# Patient Record
Sex: Male | Born: 1935 | Race: Black or African American | Hispanic: No | Marital: Married | Smoking: Former smoker
Health system: Southern US, Community
[De-identification: ages and names within clinical notes are randomized; demographics above are authoritative.]

## PROBLEM LIST (undated history)

## (undated) DIAGNOSIS — I493 Ventricular premature depolarization: Secondary | ICD-10-CM

## (undated) DIAGNOSIS — Z8546 Personal history of malignant neoplasm of prostate: Secondary | ICD-10-CM

## (undated) DIAGNOSIS — I272 Pulmonary hypertension, unspecified: Secondary | ICD-10-CM

## (undated) DIAGNOSIS — J449 Chronic obstructive pulmonary disease, unspecified: Secondary | ICD-10-CM

## (undated) DIAGNOSIS — R0902 Hypoxemia: Secondary | ICD-10-CM

## (undated) HISTORY — DX: Hypoxemia: R09.02

## (undated) HISTORY — PX: PROSTATECTOMY: SHX69

## (undated) HISTORY — PX: CYSTECTOMY: SUR359

## (undated) HISTORY — DX: Ventricular premature depolarization: I49.3

## (undated) HISTORY — DX: Personal history of malignant neoplasm of prostate: Z85.46

## (undated) HISTORY — DX: Chronic obstructive pulmonary disease, unspecified: J44.9

## (undated) HISTORY — DX: Pulmonary hypertension, unspecified: I27.20

---

## 2003-05-08 ENCOUNTER — Inpatient Hospital Stay (HOSPITAL_COMMUNITY): Admission: RE | Admit: 2003-05-08 | Discharge: 2003-05-09 | Payer: Self-pay | Admitting: General Surgery

## 2006-10-11 ENCOUNTER — Ambulatory Visit: Payer: Self-pay | Admitting: Cardiology

## 2006-10-14 ENCOUNTER — Inpatient Hospital Stay (HOSPITAL_COMMUNITY): Admission: AD | Admit: 2006-10-14 | Discharge: 2006-10-24 | Payer: Self-pay | Admitting: Critical Care Medicine

## 2006-10-14 ENCOUNTER — Ambulatory Visit: Payer: Self-pay | Admitting: Cardiology

## 2006-10-14 ENCOUNTER — Ambulatory Visit: Payer: Self-pay | Admitting: Critical Care Medicine

## 2006-10-16 ENCOUNTER — Encounter (INDEPENDENT_AMBULATORY_CARE_PROVIDER_SITE_OTHER): Payer: Self-pay | Admitting: Internal Medicine

## 2006-10-16 ENCOUNTER — Ambulatory Visit: Payer: Self-pay | Admitting: Vascular Surgery

## 2006-10-21 ENCOUNTER — Encounter: Payer: Self-pay | Admitting: Critical Care Medicine

## 2006-11-21 ENCOUNTER — Ambulatory Visit: Payer: Self-pay | Admitting: Pulmonary Disease

## 2007-01-11 ENCOUNTER — Ambulatory Visit: Payer: Self-pay | Admitting: Pulmonary Disease

## 2007-03-18 ENCOUNTER — Encounter: Payer: Self-pay | Admitting: Cardiology

## 2007-03-20 DIAGNOSIS — J438 Other emphysema: Secondary | ICD-10-CM | POA: Insufficient documentation

## 2007-03-20 DIAGNOSIS — J449 Chronic obstructive pulmonary disease, unspecified: Secondary | ICD-10-CM | POA: Insufficient documentation

## 2007-03-22 ENCOUNTER — Telehealth: Payer: Self-pay | Admitting: Pulmonary Disease

## 2007-04-17 ENCOUNTER — Encounter: Payer: Self-pay | Admitting: Cardiology

## 2007-04-29 ENCOUNTER — Ambulatory Visit: Payer: Self-pay | Admitting: Pulmonary Disease

## 2007-04-29 DIAGNOSIS — J9610 Chronic respiratory failure, unspecified whether with hypoxia or hypercapnia: Secondary | ICD-10-CM | POA: Insufficient documentation

## 2007-05-14 ENCOUNTER — Encounter: Payer: Self-pay | Admitting: Pulmonary Disease

## 2007-06-06 ENCOUNTER — Encounter: Payer: Self-pay | Admitting: Pulmonary Disease

## 2007-07-03 ENCOUNTER — Encounter: Payer: Self-pay | Admitting: Pulmonary Disease

## 2007-07-13 ENCOUNTER — Encounter: Payer: Self-pay | Admitting: Cardiology

## 2007-07-15 ENCOUNTER — Encounter: Payer: Self-pay | Admitting: Cardiology

## 2007-08-16 ENCOUNTER — Ambulatory Visit: Payer: Self-pay | Admitting: Cardiology

## 2007-08-21 ENCOUNTER — Ambulatory Visit: Payer: Self-pay | Admitting: Cardiology

## 2007-09-05 ENCOUNTER — Encounter: Payer: Self-pay | Admitting: Pulmonary Disease

## 2007-09-05 ENCOUNTER — Ambulatory Visit: Payer: Self-pay | Admitting: Cardiology

## 2007-10-09 ENCOUNTER — Encounter: Payer: Self-pay | Admitting: Pulmonary Disease

## 2007-10-10 ENCOUNTER — Telehealth (INDEPENDENT_AMBULATORY_CARE_PROVIDER_SITE_OTHER): Payer: Self-pay | Admitting: *Deleted

## 2007-10-22 ENCOUNTER — Ambulatory Visit: Payer: Self-pay | Admitting: Pulmonary Disease

## 2007-10-22 DIAGNOSIS — I270 Primary pulmonary hypertension: Secondary | ICD-10-CM | POA: Insufficient documentation

## 2007-10-25 ENCOUNTER — Telehealth (INDEPENDENT_AMBULATORY_CARE_PROVIDER_SITE_OTHER): Payer: Self-pay | Admitting: *Deleted

## 2008-03-13 ENCOUNTER — Encounter: Payer: Self-pay | Admitting: Cardiology

## 2008-07-20 ENCOUNTER — Ambulatory Visit: Payer: Self-pay | Admitting: Cardiology

## 2008-12-19 DIAGNOSIS — I4949 Other premature depolarization: Secondary | ICD-10-CM | POA: Insufficient documentation

## 2009-01-21 ENCOUNTER — Encounter: Payer: Self-pay | Admitting: Cardiology

## 2009-01-21 DIAGNOSIS — R0602 Shortness of breath: Secondary | ICD-10-CM | POA: Insufficient documentation

## 2009-02-01 ENCOUNTER — Encounter: Payer: Self-pay | Admitting: Cardiology

## 2009-02-01 ENCOUNTER — Ambulatory Visit: Payer: Self-pay | Admitting: Cardiology

## 2009-02-09 ENCOUNTER — Encounter (INDEPENDENT_AMBULATORY_CARE_PROVIDER_SITE_OTHER): Payer: Self-pay | Admitting: *Deleted

## 2009-03-29 ENCOUNTER — Ambulatory Visit: Payer: Self-pay | Admitting: Cardiology

## 2010-04-03 ENCOUNTER — Encounter: Payer: Self-pay | Admitting: General Surgery

## 2010-04-12 ENCOUNTER — Encounter: Payer: Self-pay | Admitting: Cardiology

## 2010-04-12 ENCOUNTER — Ambulatory Visit
Admission: RE | Admit: 2010-04-12 | Discharge: 2010-04-12 | Payer: Self-pay | Source: Home / Self Care | Attending: Cardiology | Admitting: Cardiology

## 2010-04-12 NOTE — Miscellaneous (Signed)
  Clinical Lists Changes  Observations: Added new observation of ECHOINTERP: mild LVH. Normal LV systolic function. Ejection fraction 65%. Abnormal diastolic function consistent with impaired relaxation. Mildly dilated left atrium. Mild mitral regurgitation. Mildly dilated right ventricle but right ventricular function is normal. Right atrium mildly dilated. Mild tricuspid regurgitation. Estimated RV systolic pressure is 43 mm of mercury. Mild pulmonary hypertension and no pericardial effusion (02/01/2009 9:42)      Echocardiogram  Procedure date:  02/01/2009  Findings:      mild LVH. Normal LV systolic function. Ejection fraction 65%. Abnormal diastolic function consistent with impaired relaxation. Mildly dilated left atrium. Mild mitral regurgitation. Mildly dilated right ventricle but right ventricular function is normal. Right atrium mildly dilated. Mild tricuspid regurgitation. Estimated RV systolic pressure is 43 mm of mercury. Mild pulmonary hypertension and no pericardial effusion

## 2010-04-12 NOTE — Assessment & Plan Note (Signed)
Summary: 6 mo fu reminder-srs   Visit Type:  Follow-up Primary Provider:  Linna Darner  CC:  follow-up visit.  History of Present Illness: the patient is a 77 year old male with a history of severe underlying lung disease, oxygen dependent follow up by pulmonary. The patient is stable from a cardiac standpoint. He reports no chest pain. He reports no palpitations or syncope. The patient has a prior documented history of palpitations correlating to PVCs and sinus tachycardia confirmed by CardioNet monitor. It has been no definite documentation of atrial fibrillation. The patient is stable from a cardiac standpoint.  Clinical Review Panels:  CXR CXR results Portable upright AP view at 0139 hours.  Mild respiratory         motion artifact.  Increased retrocardiac confluent airspace         opacities suggestive of pneumonia.  No pneumothorax, pulmonary         edema or definite pleural effusion.  Cardiac size and mediastinal         contours are within normal limits.  Minor medial right lung base         opacity also seen, but may represent atelectasis.                   IMPRESSION:         Left lower lobe pneumonia suspected. (03/13/2008)  Echocardiogram Echocardiogram Conclusions:         1. The estimated ejection fraction is 55-60%.          2. Global left ventricular wall motion and contractility are within         normal limits.         3. The left atrium is mildly dilated.         4. There is moderate mitral regurgitation.          5. The pericardium appears normal.         6. The inferior vena cava appears normal. (08/21/2007)    Preventive Screening-Counseling & Management  Alcohol-Tobacco     Smoking Status: quit > 6 months  Comments: Pt smoked for about 20 yrs but quit about 26 yrs ago.  Current Medications (verified): 1)  Spiriva Handihaler 18 Mcg  Caps (Tiotropium Bromide Monohydrate) .Marland Kitchen.. 1 Cap Every Day 2)  Nexium 40 Mg  Cpdr (Esomeprazole Magnesium) .Marland Kitchen.. 1 Every  Day 3)  Atenolol 50 Mg  Tabs (Atenolol) .Marland Kitchen.. 1 Tab Every Day 4)  Micardis Hct 80-12.5 Mg Tabs (Telmisartan-Hctz) .... Take 1 Tablet By Mouth Once A Day 5)  Advair Diskus 250-50 Mcg/dose  Misc (Fluticasone-Salmeterol) .Marland Kitchen.. 1 Puff Two Times A Day 6)  Adult Aspirin Ec Low Strength 81 Mg  Tbec (Aspirin) .... Take One Tablet By Mouth Daily. 7)  Proair Hfa 108 (90 Base) Mcg/act  Aers (Albuterol Sulfate) .... Take 2 Puffs Up To 4 Times Daily As Needed. 8)  Vitamin D3 5000 Unit/ml Liqd (Cholecalciferol) .... Take 1 Tablet By Mouth Once A Day  Allergies: 1)  ! Neosporin  Comments:  Nurse/Medical Assistant: The patient's medications were reviewed with the patient and were updated in the Medication List. Pt brought a list of his medications.  Past History:  Past Medical History: Last updated: 12/19/2008 PREMATURE VENTRICULAR CONTRACTIONS (ICD-427.69) PULMONARY HYPERTENSION (ICD-416.0) HYPOXEMIA HYPOXIA HYPOXIC (ICD-799) EMPHYSEMA (ICD-492.8) C O P D (ICD-496)  Past Surgical History: Last updated: 12/19/2008 cyst removed x 3 Prostatectomy  Family History: Last updated: 03/29/2009 noncontributory  Social History: Last updated: 12/19/2008 Retired  Married  Tobacco Use - Former.  Alcohol Use - no Regular Exercise - no Drug Use - no  Risk Factors: Smoking Status: quit > 6 months (03/29/2009)  Family History: noncontributory  Social History: Smoking Status:  quit > 6 months  Review of Systems       The patient complains of shortness of breath.  The patient denies fatigue, malaise, fever, weight gain/loss, vision loss, decreased hearing, hoarseness, chest pain, palpitations, prolonged cough, wheezing, sleep apnea, coughing up blood, abdominal pain, blood in stool, nausea, vomiting, diarrhea, heartburn, incontinence, blood in urine, muscle weakness, joint pain, leg swelling, rash, skin lesions, headache, fainting, dizziness, depression, anxiety, enlarged lymph nodes, easy bruising  or bleeding, and environmental allergies.    Vital Signs:  Patient profile:   77 year old male Height:      73 inches Weight:      236.50 pounds BMI:     31.32 O2 Sat:      94 % on Room air Pulse rate:   67 / minute BP sitting:   118 / 70  (left arm) Cuff size:   regular  Vitals Entered By: Cyril Loosen, RN, BSN (March 29, 2009 9:42 AM)  Nutrition Counseling: Patient's BMI is greater than 25 and therefore counseled on weight management options.  O2 Flow:  Room air CC: follow-up visit Comments No cardiac complaints.   Physical Exam  Additional Exam:  General: Well-developed, well-nourished in no distress head: Normocephalic and atraumatic eyes PERRLA/EOMI intact, conjunctiva and lids normal nose: No deformity or lesions mouth normal dentition, normal posterior pharynx neck: Supple, no JVD.  No masses, thyromegaly or abnormal cervical nodes lungs:decreased breath sounds bilaterally but no wheezing heart: regular rate and rhythm with normal S1 and S2, no S3 or S4.  PMI is normal.  No pathological murmurs abdomen: Normal bowel sounds, abdomen is soft and nontender without masses, organomegaly or hernias noted.  No hepatosplenomegaly musculoskeletal: Back normal, normal gait muscle strength and tone normal pulsus: Pulse is normal in all 4 extremities Extremities: No peripheral pitting edema neurologic: Alert and oriented x 3 skin: Intact without lesions or rashes cervical nodes: No significant adenopathy psychologic: Normal affect     EKG  Procedure date:  03/29/2009  Findings:      normal sinus rhythm right bundle branch block. Normal ST-T wave changes.  Impression & Recommendations:  Problem # 1:  EMPHYSEMA (ICD-492.8) the patient is followed by pulmonary. He remains on oxygen.  Problem # 2:  PULMONARY HYPERTENSION (ICD-416.0) decision making regarding medical therapies refer to pulmonary. Orders: EKG w/ Interpretation (93000)  Problem # 3:  PREMATURE  VENTRICULAR CONTRACTIONS (ICD-427.69) EKG was reviewed and demonstrates no PVCs. The patient also has no significant symptoms. He does have right bundle branch block EKG which is chronic. His updated medication list for this problem includes:    Atenolol 50 Mg Tabs (Atenolol) .Marland Kitchen... 1 tab every day    Adult Aspirin Ec Low Strength 81 Mg Tbec (Aspirin) .Marland Kitchen... Take one tablet by mouth daily.  Orders: EKG w/ Interpretation (93000)  Patient Instructions: 1)  Your physician recommends that you continue on your current medications as directed. Please refer to the Current Medication list given to you today. 2)  Follow up in 1 year.

## 2010-04-12 NOTE — Procedures (Signed)
Summary: Holter and Event/ [facility] INTERNAL MEDICINE  Holter and Event/ [facility]IC[facility]   Imported By: Dorise Hiss 03/29/2009 08:29:50  _____________________________________________________________________  External Attachment:    Type:   Image     Comment:   External Document

## 2010-04-20 NOTE — Assessment & Plan Note (Signed)
Summary: 1 yr fu reminder-srs   Visit Type:  Follow-up Primary Provider:  Hasanaj   History of Present Illness: the patient is a 77 year old male with a history of oxygen-dependent lung disease.  The patient is followed by pulmonary in [place].  He has not been seen in over a year.  He is requesting a follow-up appointment.  The patient denies any chest pain orthopnea PND.  He had an echocardiogram performed in 2010 which was within normal limits.  He has no history of coronary artery disease.  He denies any palpitations or syncope.  He has prior history of PVCs which appear to have resolved.  From a cardiovascular standpoint is otherwise stable.  Preventive Screening-Counseling & Management  Alcohol-Tobacco     Smoking Status: quit     Year Quit: 1985  Current Medications (verified): 1)  Spiriva Handihaler 18 Mcg  Caps (Tiotropium Bromide Monohydrate) .Marland Kitchen.. 1 Cap Every Day 2)  Nexium 40 Mg  Cpdr (Esomeprazole Magnesium) .Marland Kitchen.. 1 Every Day 3)  Atenolol 50 Mg  Tabs (Atenolol) .Marland Kitchen.. 1 Tab Every Day 4)  Micardis Hct 80-12.5 Mg Tabs (Telmisartan-Hctz) .... Take 1 Tablet By Mouth Once A Day 5)  Advair Diskus 250-50 Mcg/dose  Misc (Fluticasone-Salmeterol) .Marland Kitchen.. 1 Puff Two Times A Day 6)  Adult Aspirin Ec Low Strength 81 Mg  Tbec (Aspirin) .... Take One Tablet By Mouth Daily. 7)  Proair Hfa 108 (90 Base) Mcg/act  Aers (Albuterol Sulfate) .... Take 2 Puffs Up To 4 Times Daily As Needed. 8)  Vitamin D3 5000 Unit/ml Liqd (Cholecalciferol) .... Take 1 Tablet By Mouth Once A Day  Allergies: 1)  ! Neosporin  Comments:  Nurse/Medical Assistant: The patient's medication list and allergies were reviewed with the patient and were updated in the Medication and Allergy Lists.  Past History:  Past Medical History: Last updated: 12/19/2008 PREMATURE VENTRICULAR CONTRACTIONS (ICD-427.69) PULMONARY HYPERTENSION (ICD-416.0) HYPOXEMIA HYPOXIA HYPOXIC (ICD-799) EMPHYSEMA (ICD-492.8) C O P D  (ICD-496)  Past Surgical History: Last updated: 12/19/2008 cyst removed x 3 Prostatectomy  Family History: Last updated: 04/12/2010 Negative FH of Diabetes, Hypertension, or Coronary Artery Disease  Social History: Last updated: 12/19/2008 Retired  Married  Tobacco Use - Former.  Alcohol Use - no Regular Exercise - no Drug Use - no  Risk Factors: Exercise: no (12/19/2008)  Risk Factors: Smoking Status: quit (04/12/2010)  Family History: Negative FH of Diabetes, Hypertension, or Coronary Artery Disease  Social History: Smoking Status:  quit  Review of Systems       The patient complains of weight gain/loss and shortness of breath.  The patient denies fatigue, malaise, fever, vision loss, decreased hearing, hoarseness, chest pain, palpitations, prolonged cough, wheezing, sleep apnea, coughing up blood, abdominal pain, blood in stool, nausea, vomiting, diarrhea, heartburn, incontinence, blood in urine, muscle weakness, joint pain, leg swelling, rash, skin lesions, headache, fainting, dizziness, depression, anxiety, enlarged lymph nodes, easy bruising or bleeding, and environmental allergies.    Vital Signs:  Patient profile:   77 year old male Height:      73 inches Weight:      242 pounds BMI:     32.04 O2 Sat:      90 % on 2 L/min via [place] Pulse rate:   83 / minute BP sitting:   121 / 70  (left arm) Cuff size:   large  Vitals Entered By: Carlye Grippe (April 12, 2010 11:24 AM)  Nutrition Counseling: Patient's BMI is greater than 25 and therefore counseled on weight  management options.  O2 Flow:  2 L/min via   Physical Exam  Additional Exam:  General: Well-developed, well-nourished in no distress head: Normocephalic and atraumatic eyes PERRLA/EOMI intact, conjunctiva and lids normal nose: No deformity or lesions mouth normal dentition, normal posterior pharynx neck: Supple, no JVD.  No masses, thyromegaly or abnormal cervical nodes lungs:decreased breath  sounds bilaterally but no wheezing heart: regular rate and rhythm with normal S1 and S2, no S3 or S4.  PMI is normal.  No pathological murmurs abdomen: Normal bowel sounds, abdomen is soft and nontender without masses, organomegaly or hernias noted.  No hepatosplenomegaly musculoskeletal: Back normal, normal gait muscle strength and tone normal pulsus: Pulse is normal in all 4 extremities Extremities: No peripheral pitting edema neurologic: Alert and oriented x 3 skin: Intact without lesions or rashes cervical nodes: No significant adenopathy psychologic: Normal affect     EKG  Procedure date:  04/12/2010  Findings:      normal sinus rhythm, right bundle branch block.  Heart rate 60 beats/min.  Impression & Recommendations:  Problem # 1:  PREMATURE VENTRICULAR CONTRACTIONS (ICD-427.69) resolved.  EKG was reviewed the patient is a chronic right bundle branch block pattern no acute changes. His updated medication list for this problem includes:    Atenolol 50 Mg Tabs (Atenolol) .Marland Kitchen... 1 tab every day    Adult Aspirin Ec Low Strength 81 Mg Tbec (Aspirin) .Marland Kitchen... Take one tablet by mouth daily.  Problem # 2:  PULMONARY HYPERTENSION (ICD-416.0) the patient has significant pulmonary hypertension secondary to his lung disease.  This is followed by his pulmonologist in [place] and we have scheduled a follow-up appointment. Orders: EKG w/ Interpretation (93000)  Problem # 3:  EMPHYSEMA (ICD-492.8) Assessment: Comment Only  Patient Instructions: 1)  Your physician recommends that you continue on your current medications as directed. Please refer to the Current Medication list given to you today. 2)  Follow up in  1 year

## 2010-07-26 NOTE — Assessment & Plan Note (Signed)
[facility]                          Danny Martinez   Danny Martinez, Danny Martinez                      MRN:          [id number]  DATE:07/20/2008                            DOB:          24-May-1933    REFERRING PHYSICIAN:  Erasmo Downer, MD   The patient is a 77 year old male with a history of severe underlying  lung disease, oxygen-dependent secondary to bullous emphysema, followed  by Dr. Vassie Loll in [place].  From cardiac standpoint, the patient is  stable.  He reports no chest pain.  He also reports no palpitations or  syncope.  Previously, we have documented PVCs and sinus tachycardia by  CardioNet monitor.  There has been no definite documentation of atrial  fibrillation, however.  The patient also had a discussion regarding  Coumadin use with Dr. Vassie Loll and a decision was made at this point not to  use Coumadin for his pulmonary hypertension, which on his last  echocardiogram quite frankly had improved to 62 mmHg.   The patient uses appropriately his oxygen on exertion and at night.  He  denies any orthopnea or PND.   MEDICATIONS:  1. Atenolol 50 mg p.o. daily.  2. Nexium 40 mg p.o. daily.  3. Spiriva inhaler.  4. Advair 250/50 one puff b.i.d.  5. Aspirin 81 mg p.o. daily.  6. Oxygen 1-2 L.  7. Micardis hydrochlorothiazide 80/12.5 mg p.o. daily.  8. Vitamin D3 5000 units daily.   PHYSICAL EXAMINATION:  VITAL SIGNS:  Blood pressure 178/78, heart rate  66, respirations 20, and weight 240 pounds.  GENERAL:  Well-nourished  African American male in no apparent distress.  HEENT:  Pupils, eyes are equal.  Conjunctivae clear.  NECK:  Supple.  No carotid upstroke.  No carotid bruits.  No adenopathy  in cervical or supraclavicular areas.  No thyromegaly.  Non-nodular  thyroid.  No carotid bruits.  LUNGS:  Diminished breath sounds bilaterally with no wheezing.  HEART:  Regular rate and rhythm.  Normal S1 and S2 with an occasional  extrasystole.  ABDOMEN:  Soft, nontender.  No rebound or guarding.  Good bowel sounds.  EXTREMITIES:  No cyanosis, clubbing, or edema.  NEUROLOGIC:  The patient is alert and oriented.  Grossly nonfocal.   PROBLEM LIST:  1. Premature atrial contraction/premature ventricular contractions and      sinus arrhythmia and no definitive atrial fibrillation.  2. 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 Vassie Loll.  3. Breast, thoracic, and prostate cancer.  4. Pulmonary hypertension.   PLAN:  1. The patient will have a repeat echocardiogram done in 6 months,      particularly to follow up on his mitral regurgitations.  Last      echocardiogram showed moderate mitral regurgitation.  On physical      examination, I cannot hear an MR murmur at the present time.  2. Clinically, also the patient has no heart failure symptoms and      therefore we will defer from repeating the echo at the present      time.  3.  From cardiac perspective, the patient will need to undergo further      workup and he can follow up with Dr. Vassie Loll regarding his lung      disease.  4. The patient has developed hypertension on Micardis, but he seems to      be tolerating.     Learta Codding, MD,FACC  Electronically Signed    GED/MedQ  DD: 07/20/2008  DT: 07/21/2008  Job #: [id number]   cc:   Erasmo Downer, MD

## 2010-07-26 NOTE — Assessment & Plan Note (Signed)
[facility]                          [facility] NOTE   Danny Martinez, Danny Martinez                      MRN:          [id number]  DATE:09/05/2007                            DOB:          1933/06/06    REFERRING PHYSICIAN:  Erasmo Downer, MD   HISTORY OF PRESENT ILLNESS:  The patient is a 77 year old male with  severe underlying lung disease, oxygen-dependent secondary to bullous  emphysema followed by Dr. Vassie Loll in [place].  The patient was seen in  consultation on August 16, 2007.  The possibility of atrial fibrillation  that was noted on Holter monitor.  We reviewed this data carefully and  its report in the note from August 16, 2007.  There was sinus arrhythmia  and frequent PACs, but no definitive or sustained atrial fibrillation.  I felt the patient based on this finding did not meet the criteria for a  Coumadin.  However, on echocardiographic study obtained, did demonstrate  ongoing severe pulmonary hypertension with PA pressure of 62 mmHg, but  normal RV function.  Based on this criteria, the patient could  potentially benefit from Coumadin, but we will leave this decision to  Dr. Vassie Loll.  The patient also reports no palpitations.  Echo also  demonstrates mitral regurgitation, but this certainly not explaining the  patient's pulmonary hypertension, which is suspected to be due to his  bullous emphysema.   MEDICATIONS:  1. Atenolol 50 mg p.o. daily.  2. Micardis 40 mg p.o. daily.  3. Nexium  40 mg p.o. daily.  4. Spiriva.  5. Advair.  6. Aspirin.  7. Oxygen.   PHYSICAL EXAMINATION:  VITAL SIGNS:  Blood pressure 160/83, heart rate  62, and weight 238 pounds.  NECK:  Normal carotid upstroke and no carotid bruits.  LUNGS:  Diminished breath sounds bilaterally.  HEART:  Regular rate and rhythm with normal S1 and S2 and no murmur,  rubs, or gallops.  ABDOMEN:  Soft.  EXTREMITIES:  No cyanosis, clubbing, or edema.   PROBLEMS:  1.  Premature atrial contractions.  Sinus arrhythmia (no definite      evidence of sustained atrial fibrillation).  2. Severe underlying lung disease with nonobstructive pulmonary      disease and emphysema.  3. History of breast mass.  4. Prostate cancer.  5. Pulmonary hypertension (improved by echo from 85 to 62 mmHg).   PLAN:  1. The patient has no clear indication to start the Coumadin for the      possibility of atrial fibrillation; however, does have pulmonary      hypertension and might still benefit from Coumadin in regards to      this release by Dr. Vassie Loll.  2. Although, the patient has severe pulmonary hypertension based on      his severe bullous lung disease, a vasodilator therapy with      bosentan and Remodulin may be inappropriate secondary to      ventilation-perfusion mismatch with his significant oxygen      dependence.  Again, I have asked the patient to further discuss      this with  Dr. Vassie Loll.  3. From a cardiac standpoint, however, I do not think the patient      needs any further workup at the present time.     Learta Codding, MD,FACC  Electronically Signed    GED/MedQ  DD: 09/05/2007  DT: 09/06/2007  Job #: [id number]   cc:   Erasmo Downer, MD  Oretha Milch, MD

## 2010-07-26 NOTE — Discharge Summary (Signed)
NAME:  Danny Martinez, Danny Martinez               ACCOUNT NO.:  [id number]   MEDICAL RECORD NO.:  [id number]          PATIENT TYPE:  INP   LOCATION:  3712                         FACILITY:  [facility]   PHYSICIAN:  Oretha Milch, MD      DATE OF BIRTH:  [birth year]   DATE OF ADMISSION:  10/14/2006  DATE OF DISCHARGE:  10/24/2006                               DISCHARGE SUMMARY   PRIORITY DISCHARGE SUMMARY   FINAL DISCHARGE DIAGNOSES:  1. Status post cardiopulmonary arrest.  2. Community-acquired pneumonia (no organism specified) with      exacerbation of chronic obstructive pulmonary disease.  3. Acute renal failure (resolving).   LABORATORY DATA:  October 19, 2006:  White blood cell count 7.2,  hemoglobin 9.7, hematocrit 29.7, platelet count 353.  October 14, 2006:  Blood cultures x2 negative.  October 20, 2006:  Sodium 143, potassium  4.6, chloride 109, CO2 28, glucose 80, BUN 21, creatinine 1.58.   RADIOLOGY:  Nuclear medicine myocardial perfusion scan findings:  No  fixed or reversible defects are seen to suggest infarction or ischemia.  There was normal wall motion and myocardial thickening with contraction.  The estimated ejection fraction was 78%.   A chest x-ray obtained on October 21, 2006, demonstrates improved  pulmonary infiltrates.   BRIEF HISTORY:  Danny Martinez is a 77 year old gentleman, who transferred  to [facility] from [facility] status post  cardiopulmonary arrest in the setting of hypoxic respiratory failure and  pneumonia.  He has a history of hypertension, and originally presented  to his primary care Danny Martinez on October 11, 2006, with progressive  shortness of breath.  At that time, he was noted to be severely hypoxic  and therefore was admitted to the hospital.  A CT scan was obtained that  demonstrated a right lower lobe pulmonary infiltrate consistent with a  pneumonia.  There was no evidence of pulmonary emboli.  There was  significant bullous disease of  the lung consistent with chronic  obstructive pulmonary disease changes.  The patient was in the hospital,  was apparently ambulating to the restroom on October 13, 2006, developed  a decreased level of consciousness, and progressive cardiopulmonary  arrest.  Medical records indicate probable pulseless electrical activity  on telemetry, he was originally found to be bradycardic without palpable  pulses, he was given epinephrine, and required endotracheal intubation,  additionally he was supported on Levophed and Neo-Synephrine drip.  He  was transferred to [facility] for further evaluation and  therapy.   HOSPITAL COURSE BY DISCHARGE DIAGNOSES:  1. Status post cardiopulmonary arrest.  After evaluation of clinical      events, Danny Martinez, as previously, mentioned developed acute      cardiopulmonary arrest at [facility], most likely in the      setting of hypoxic respiratory failure, precipitating brady-      asystolic/PEA arrhythmia.  He was primarily treated with      endotracheal intubation, brief course of initially Epinephrine,      then Levophed and Neo-Synephrine drips.  These were eventually  weaned to off.  He was liberated from vasoactive support,      eventually extubated on October 17, 2006, without further sequelae.      He did have a central line placed on hospital admit day, this was      removed on day of discharge.  Danny Martinez underwent further cardiac      evaluation including nuclear medicine myocardial perfusion scan      with the results mentioned above.  There was no active evidence of      cardiopulmonary disease.  It is the primary opinion of the Critical      Care Service that this was a brady-asystolic event in the setting      of severe hypoxia.  2. Community-acquired pneumonia (no organism specified), complicated      by underlying severe bullous emphysema.  Danny Martinez, as previously      mentioned, was transferred to [facility] on  full      ventilator support.  Therapy consisted of initially Levaquin and      doxycycline from July 31 to August 03, then upon arrival to [facility] he was treated with Rocephin and Azithromycin from      August 03 to August 10.  He completed his full course of      antibiotics.  His respiratory status continued to improve over the      course of his hospitalization including improvement in chest x-ray.      Upon time of discharge, Danny Martinez continues to have decreased      oxygen saturations with activity.  He was noted to have resting      room air saturations at 94%, however, these dropped quickly to 84%      with ambulation.  He will therefore be sent home on supplemental      oxygen at 2 liters until further evaluation by Dr. Vassie Loll.      Additionally in regards to his emphysema, Danny Martinez will be      discharged to home on daily Spiriva, additionally he will be      discharged to home on as needed Xopenex.  Upon time of discharge,      Danny Martinez has reached maximum hospital benefit of inpatient stay.      He will be discharged to home with close Pulmonary and Internal      Medicine followup.  3. Acute renal failure.  Danny Martinez presenting creatinine was 3.49.      Upon the time of discharge, it is now down to 1.58, most likely      secondary to a low perfusion state during cardiopulmonary arrest.      His Micardis has been held during this time, a Cardiology      consultation was obtained with [facility].  Upon time of      discharge, his blood pressure is 139/80.  Recommendations would      certainly be to follow up serum creatinine, with plan to eventually      resume Micardis when renal function improves.   DISCHARGE INSTRUCTIONS:  1. Oxygen 2 liters at all times.  2. Increase activity as tolerated.  3. Low sodium diet.  4. Follow up Dr. Vassie Loll with [facility] on November 21, 2006;      also Dr. Linna Darner on Friday, the 22nd, at 10:30 a.m.    DISCHARGE MEDICATIONS:  1. Spiriva one cap inhaled daily.  2.  Nexium 40 mg tab daily.  3. Atenolol 50 mg daily.  4. Aspirin 325 mg daily.  5. Xopenex neb, one neb every 6 hours as needed, or Xopenex-HFA      inhaler 2 puffs as needed every 4 to 6 hours.  6. Again, Mr. Burchill was instructed to hold his Micardis.   Extensive education about maintenance bronchodilators versus rescue  bronchodilators was given during time of discharge.  Additionally, he  was instructed to call should he become more short of breath, require  rescue nebulizers more than six times a day, or recurrent worsening  fever.  Upon time of discharge, Mr. Fedewa has reached maximum benefit  of inpatient care.  He will be discharged to home with his wife.      Zenia Resides, NP      ______________________________  Oretha Milch, MD    PB/MEDQ  D:  10/24/2006  T:  10/24/2006  Job:  [id number]   cc:   Erasmo Downer, MD  [place], [place], [address] FAX - [phone] [address], [place]: [phone]

## 2010-07-26 NOTE — Assessment & Plan Note (Signed)
[facility]                          [facility] OFFICE NOTE   Danny Martinez, Danny Martinez                      MRN:          [id number]  DATE:08/16/2007                            DOB:          Aug 14, 1933    REFERRING PHYSICIAN:  Hayden Rasmussen   REFERRING PHYSICIAN:  Erasmo Downer, MD   REASON FOR CONSULTATION:  Rule out atrial fibrillation noted on Holter  monitor.   HISTORY OF PRESENT ILLNESS:  The patient is a 77 year old male with  severe underlying lung disease who is oxygen dependent secondary to  bolus emphysema followed by Dr. Felipa Eth in [place].  The patient was  recently recommended to enroll in an exercise program.  At the mall, he  was found to have an irregular heart rate and was referred by Dr. Linna Darner  for a Holter monitor.  There was some question as to whether the patient  had atrial fibrillation.  I have the results of the Holter monitor and  carefully reviewed this and I do not see any evidence of atrial  fibrillation, but rather of sinus arrhythmia and frequent premature  atrial contractions.  There is also frequent sinus tachycardia.  The  patient, however, was asymptomatic with this and reports no chest pain  or worsening dyspnea.  He has been started on aspirin 81 mg p.o. daily  in the interim.  Of note also, this patient had a prior cardiac workup  done in 2005 by Dr. Domingo Sep at [facility].  This was  reportedly negative although we do not have the details regarding the  workup.  The patient reportedly had an episode of acute dyspnea after he  was scheduled for evaluation of a left breast mass.  He is now followed  for the latter with yearly mammograms, but has never had a biopsy done.  The patient also has history of hypertension, non-insulin diabetes  mellitus and prostate cancer.  He denies any presyncope or syncope.   ALLERGIES:  NEOSPORIN.   MEDICATIONS:  1. Atenolol 50 mg p.o. daily.  2. Micardis 40 mg p.o.  daily.  3. Nexium a Nexium 40 mg p.o. daily.  4. Spiriva.  5  Advair to 250/50 b.i.d.   SOCIAL HISTORY:  The patient is a former smoker, quit several years ago.  There is no current or past use of alcohol use.  The patient is married  and has a son who lives at home independently.   FAMILY HISTORY:  There is no history of premature coronary artery  disease.   PAST MEDICAL HISTORY:  See documentation above and see per problem list.   REVIEW OF SYSTEMS:  No nausea or vomiting.  No fever or chills.  No  melena or hematochezia.  No dysuria or frequency.  The remainder of  review of systems is negative and positives are noted above .   PHYSICAL EXAMINATION:  VITAL SIGNS:  Blood pressure 164/77, heart rate  is 64 beats a minute. Weight is 241 pounds.  GENERAL:  A well-nourished African male in no apparent distress.  HEENT:  Pupils :  Pupils are isochoric,  conjunctivae clear.  NECK:  Supple.  No carotid upstroke, no carotid bruits.  LUNGS:  Diminished breath sounds bilaterally.  HEART:  Regular rate and rhythm with normal S, S2.  No murmur, rubs or  gallops.  ABDOMEN:  Soft, nontender.  No rebound, tenderness or guarding.  Good  bowel sounds.  EXTREMITY EXAM:  No cyanosis, clubbing or edema.  NEURO:  The patient is alert, oriented and grossly nonfocal.   PROBLEM LIST:  1. Atrial arrhythmias, asymptomatic.  2. No definite evidence of atrial fibrillation.  3. Severe underlying lung disease with chronic pulmonary obstructive      disease and emphysema.  4. History of breast mass.  5. History of prostate cancer.   PLAN:  1. The patient can follow up with [place] in the next couple of months.  I      do not think he has any evidence of atrial fibrillation.      Therefore, I did not initiate therapy with Coumadin.  2. I made no change in the patient's medical therapy.  3. The patient will have an echocardiograph study to evaluate left and      right heart function.  If there are any  further abnormalities, they      will be addressed and the patient will be contacted.     Learta Codding, MD,FACC  Electronically Signed    GED/MedQ  DD: 08/16/2007  DT: 08/16/2007  Job #: [id number]   cc:   Erasmo Downer, MD

## 2010-07-26 NOTE — Consult Note (Signed)
NAMEMarland Kitchen  Danny Martinez, Danny Martinez NO.:  [id number]   MEDICAL RECORD NO.:  [id number]          PATIENT TYPE:  INP   LOCATION:  2110                         FACILITY:  [facility]   PHYSICIAN:  Jonelle Sidle, MD DATE OF BIRTH:  1933/05/10   DATE OF CONSULTATION:  10/14/2006  DATE OF DISCHARGE:                                 CONSULTATION   REASON FOR CONSULTATION:  Abnormal cardiac markers in the setting of  acute illness.   HISTORY OF PRESENT ILLNESS:  Danny Martinez is a 77 year old gentleman,  transferred today from [facility] with respiratory  failure and apparent pneumonia. He has a history of hypertension and  presented originally on October 11, 2006 to his primary care physician's  practice with progressive shortness of breath. He was noted at that time  to be severely hypoxic and was admitted to the hospital for further  evaluation. CT scan of the chest revealed an infiltrate in the right  lower lobe, suggestive of pneumonia and available records indicate that  no clear pulmonary embolus was identified. He also had findings  consistent with significant chronic obstructive pulmonary disease. On  October 13, 2006, he was using the restroom and when he stood up from the  commode, apparently lost consciousness. A CODE BLUE was activated. There  is description in the records of a possible PEA arrest.  The patient was  seen by a cardiology fellow on call at [facility] today,  who reviewed telemetry strips that revealed reported sinus bradycardia,  converting to sinus tachycardia following epinephrine in the code. He  received no shocks and was intubated at that time with other support  including Levophed and Neo-Synephrine.   Presently, the patient is intubated in the Intensive Care Unit. He is  awake. No laboratory data is yet available at this facility but I note  that the patient's troponin I level increased up to 0.49 with a normal  BNP of 75 at  [facility]. His electrocardiogram at this  point shows what looks to be a probable atrial flutter with variable  conduction. His telemetry also suggests, as well as some paroxysmal  atrial fibrillation. Bedside echocardiography now reveals fairly  vigorous ventricular systolic function at 65%. The study was very  limited due to lung interference and it is difficulty to assess  adequately for any focal wall motion abnormalities. It was noted that  the  right ventricular function looked to be decreased and pulmonary artery  systolic pressures were mildly elevated. No obvious pericardial effusion  was noted. Of note, during the echocardiographic study, telemetry seemed  to show that the patient converted back to sinus rhythm with frequent  atrial ectopy and concurrently, his systolic blood pressure increased.   ALLERGIES:  NEOSPORIN.   MEDICATIONS:  Recent medications have included heparin drip, Neo-  Synephrine, Levophed, aspirin 325 mg daily, Rocephin 1 gram IV q.24  hours, Zithromax 500 mg IV q.24 hours, Zithromax 500 mg IV q.24 hours,  Protonix 40 mg IV q.24 hours, Xopenex 8 puffs MDI via vent q.6 hours,  Atrovent 8 puffs MDI via vent  q.6 hours.   SOCIAL HISTORY:  The patient is a previous smoker but quit several years  ago. He is married. He has 1 son. Lives in [place] and  functions independently at home. No significant alcohol use.   FAMILY HISTORY:  Reviewed and noncontributory at this point.   REVIEW OF SYSTEMS:  Unable to be obtained at this time as the patient is  on a ventilator. Per the patient's wife, he has not had any major  exertional chest pain or dyspnea other than the recent symptoms  described above.   PHYSICAL EXAMINATION:  VITAL SIGNS:  Presently systolic blood pressure  80 off pressors. Heart rate in the 120's to 130's with atrial  tachycardia down around 100 when back in sinus rhythm. He is intubated  on the ventilator.  HEENT:   Conjunctivae normal.  NECK:  No loud carotid bruits noted over ventilator sounds. No  thyromegaly.  LUNGS:  No wheezing. Coarse breath sounds. Some egophony no the right.  CARDIOVASCULAR:  Examination reveals fairly distant heart sounds,  irregular. No loud murmur or pericardial rub.  ABDOMEN:  Soft. Bowel sounds present. No tenderness is noted.  EXTREMITIES:  Exhibit no marked pitting edema.  SKIN:  Warm and dry.  MUSCULOSKELETAL:  No kyphosis is noted.   LABORATORY DATA:  Data from [facility], total CK 489, CK  MB 12.5, relative index 2.5, troponin I 0.49, up to 0.57. BNP 75. BUN  44, creatinine 4.8. Sodium 128, glucose 210. SGPT 30, SGOT 43, INR 1.1.  WBC is 12.6. Hemoglobin 12.7. Hematocrit 39.5. Platelets 130,000.   Chest x-ray demonstrates right base opacity, also retrocardiac opacity.  Heart size is normal. No pleural fluid noted or pulmonary edema.   IMPRESSION:  1. Abnormal cardiac markers with minor troponin I increase in the      setting of acute illness including respiratory failure, requiring      mechanical ventilation, pneumonia, recent arrest (reportedly PEA      arrest), and acute renal failure. Also complicating matters is      recurrent atrial arrhythmia, including what looks to be atrial      fibrillation and atrial flutter with rapid ventricular response.      The patient's electrocardiogram shows a right bundle branch block      pattern and no other acute changes to suggest active injury      current. It may well be that his cardiac marker abnormalities are      related to supply and demand mismatch in the setting of acute      illness. Bedside echocardiography demonstrates preserved left      ventricular systolic function, although is limited with the      assessment of wall motion abnormality. Right ventricular      dysfunction exists and there is some mild pulmonary hypertension      evidence, likely related to his lung disease.  2. Baseline  history of hypertension.  3. Prior history of tobacco use.  4. Uncertain lipid status.  5. Reported diabetes mellitus, diet managed.   RECOMMENDATIONS:  I reviewed the situation with the family and also with  Dr. Delford Field. At this point, the main goal is stabilization from the  perspective of his hemodynamics and pulmonary illness. Amiodarone would  be reasonable to try to suppress further atrial arrhythmias, which are  likely to recur in the setting of his active illness. He has been  anticoagulated recently, which is also reasonable. Would continue to  follow cardiac markers for trend but at this point, I would anticipate  medical stabilization. He would not be a good candidate for invasive  cardiac evaluation, particularly in light of his acute renal failure.  Once he stabilizes further, we can make further recommendations. If  further hemodynamic support is required, would consider Neo-Synephrine  rather than Levophed, given his recurrent atrial arrhythmias. We will  follow with you.     Jonelle Sidle, MD  Electronically Signed    SGM/MEDQ  D:  10/14/2006  T:  10/14/2006  Job:  [id number]

## 2010-07-26 NOTE — Assessment & Plan Note (Signed)
[facility]                             PULMONARY OFFICE NOTE   NAME:Danny Martinez, Danny Martinez                      MRN:          [id number]  DATE:11/21/2006                            DOB:          05-27-1933    Danny Martinez is a 77 year old African-American gentleman who was recently  hospitalized status post cardiopulmonary arrest in the setting of  hypoxic respiratory failure and a right lower lobe pneumonia.  He was  transferred from [facility] to [facility].  A CT scan did not show  pulmonary emboli, but showed a right lower lobe infiltrate and  significant bullous disease of both lungs.  He developed acute renal  failure which resolved by the time of discharge.  Cardiac evaluation  including nuclear stress test did not show any evidence of ischemia.  It  was felt that he had a brady asystolic even in the setting of severe  hypoxia.   He states that his breathing is okay today.  Denies productive cough or  chest tightness.  There are occasions that he is able to tolerate being  off the oxygen.   PHYSICAL EXAMINATION:  VITAL SIGNS:  Weight 228 pounds, temperature  97.5, heart rate 68/min, blood pressure 138/70, oxygen saturation 98% 3  L oxygen, 92% on room air, 84% on ambulation (88 feet).  CV:  S1, S2 normal.  LUNGS:  She has decreased breath sounds both bases.  EXTREMITIES:  No pedal edema.   Spirometry in the office today showed an FEV1/FVC ratio of 31, FEV1 24%  (0.77), FVC 52% (2.47).  There was significant bronchodilator response.   IMPRESSION:  1. Reversible moderate airway obstruction consistent with chronic      obstructive pulmonary disease with a component of asthma.  2. Bullous emphysema.  3. Hypoxia on exertion.   RECOMMENDATIONS:  1. He will continue Spiriva and Xopenex.  2. He has been tolerating atenolol, and so I will not change this.  3. He will use Xopenex on a p.r.n. basis only.   Given his reversibility we will consider  adding Advair in the future in  case his dyspnea worsens.     Oretha Milch, MD  Electronically Signed   RVA/MedQ  DD: 11/21/2006  DT: 11/22/2006  Job #: [id number]   cc:   Erasmo Downer, MD

## 2010-07-26 NOTE — Assessment & Plan Note (Signed)
[facility]                             PULMONARY OFFICE NOTE   Danny Martinez, Danny Martinez                      MRN:          [id number]  DATE:01/11/2007                            DOB:          Jun 27, 1933    DATE OF SERVICE:  January 11, 2007.   Danny Martinez is a 77 year old gentleman with severe COPD and predominant  bullous emphysema, FEV1 of 24% in September 2008, with a significant  bronchodilator response.  He has been maintained on two liters of oxygen  due to desaturation on exertion.  Today in the office, his O2 saturation  was 92% on room air and dropped to 85% on walking a few yards.  He feels  well and is able to do more at home.  He has gained about 10 pounds  since his last visit, which he attributes to inactivity.   MEDICATIONS:  1. Spiriva inhaler daily.  2. Nexium 40 mg daily.  3. Atenolol 50 mg daily.  4. Aspirin 325 mg daily.  5. Xopenex nebs q.6h p.r.n.   PHYSICAL EXAMINATION:  VITAL SIGNS:  Weight 238 pounds, temperature  97.8, blood pressure 150/88, heart rate 62 per minute, and oxygen  saturation 92% on room air.  HEENT:  No thrush.  NECK:  Supple, no JVD, no lymphadenopathy.  CVS:  S1 and S2 normal.  CHEST:  Decreased breath sounds in both bases, no rhonchi.   IMPRESSION AND PLAN:  1. Chronic obstructive pulmonary disease/bullous emphysema.  We will      continue his Spiriva inhaler and Xopenex nebs as needed.  Given the      reversibility on spirometry, I do want to add inhaled steroids to      this regimen; however, Danny Martinez does not want to do this at this      time.  I will defer this to our future visits since he is doing      well.  2. He continues to be hypoxic on exertion and will use two liters of      nasal cannula, and he will start on an exercise regimen on his bike      at home.  I have asked him to enroll in the Pulmonary Rehab Program      if someone is available at [facility].  His immunizations seem to be up    to date.     Oretha Milch, MD  Electronically Signed    RVA/MedQ  DD: 01/11/2007  DT: 01/11/2007  Job #: [id number]   cc:   Erasmo Downer, MD

## 2010-07-29 NOTE — Procedures (Signed)
NAME:  EDWIN, BAINES                         ACCOUNT NO.:  [id number]   MEDICAL RECORD NO.:  [id number]                   PATIENT TYPE:  INP   LOCATION:  A220                                 FACILITY:  [facility]   PHYSICIAN:  Dani Gobble, MD                    DATE OF BIRTH:  1933-07-27   DATE OF PROCEDURE:  05/08/2003  DATE OF DISCHARGE:                                  ECHOCARDIOGRAM   INDICATIONS FOR PROCEDURE:  Mr. Boateng is a 77 year old gentleman who was  scheduled for outpatient surgery today but soon after anesthesia, he became  quite hypotensive and bradycardiac requiring resuscitation with pressors.   FINDINGS:  The aorta is within normal limits at 3.3 cm.   The left atrium is at the upper limits of normal at 4.1 cm. No obvious clots  or masses were appreciated and the patient appeared to be in sinus rhythm  during this procedure.   The interventricular septum and posterior wall were mildly thickened at 1.3  for each.   The aortic valve was trileaflet and pliable but mildly thickened. Leaflet  excursion was normal. No aortic insufficiency was noted. Doppler  interrogation of the aortic valve was within normal limits.   The mitral valve also appeared minimally thickened but with normal leaflet  excursion. No mitral valve prolapse was noted. Mild mitral regurgitation was  appreciated. Doppler interrogation of the mitral valve is within normal  limits.   The pulmonic valve was not well visualized but appeared to be grossly  structurally normal. The tricuspid valve also appeared grossly structurally  normal with trace to mild tricuspid regurgitation noted.   The left ventricle is normal in size. Overall left ventricular systolic  function was quite vigorous. No regional wall motion abnormalities were  noted. There was additional basal septal hypertrophy, as is common in the  elderly. No left ventricular outflow tract obstruction was appreciated. The  presence of mild  diastolic dysfunction is inferred from pulse wave Doppler  across the mitral valve. The right atrium appeared somewhat generous, as did  the right ventricle but right ventricular systolic function was preserved.  The inferior vena cava was normal in size with a good collapse.   IMPRESSION:  1. Left atrium is at the upper limits of normal in size.  2. Mild concentric left ventricular hypertrophy.  3. Mild mitral regurgitation.  4. Trace to mild tricuspid regurgitation.  5. Normal left ventricular size with vigorous left ventricular systolic     function. No regional wall motion abnormalities noted. The presence of     diastolic dysfunction is inferred from pulse wave Doppler across the     mitral valve.  6. Basal septal hypertrophy is present as is common in the elderly.  7. The right atrium and right ventricle appeared somewhat generous but right     ventricular systolic function was normal.  ___________________________________________                                            Dani Gobble, MD   AB/MEDQ  D:  05/08/2003  T:  05/08/2003  Job:  [id number]   cc:   Dirk Dress. Katrinka Blazing, M.D.  [address]  [place]  [place] [address]  Fax: [phone]

## 2010-07-29 NOTE — H&P (Signed)
NAME:  Danny Martinez, Danny Martinez                         ACCOUNT NO.:  [id number]   MEDICAL RECORD NO.:  [id number]                   PATIENT TYPE:  AMB   LOCATION:  DAY                                  FACILITY:  [facility]   PHYSICIAN:  Jerolyn Shin C. Katrinka Blazing, M.D.                DATE OF BIRTH:  1933/07/04   DATE OF ADMISSION:  DATE OF DISCHARGE:                                HISTORY & PHYSICAL   HISTORY:  [age]-year-old male with a history of an enlarged, non-  painful breast on the left side.  The patient underwent total prostatectomy  in 2003.  He has been Eligard and progesterone for elevated PSA.  There is  no history of injury to his breast.  The right breast has been normal.  There is no known family history of breast cancer.  Because of the  increasingly large size of the breast, the patient is scheduled to have a  subcutaneous mastectomy.   PAST HISTORY:  He has:  1. Gastroesophageal reflux disease.  2. Hypertension.  3. History of prostate cancer.   SURGERY:  1. Radical prostatectomy.  2. Pilonidal cyst surgery.   MEDICATIONS:  1. Eligard 30 mg every 3 months.  2. Nexium 40 mg a day.  3. Tenormin -- dose unknown.  4. Micardis 40 mg a day.  5. Progesterone -- dose unknown.   FAMILY HISTORY:  Family history is positive for hypertension,  atherosclerotic heart disease, diabetes and stroke.   REVIEW OF SYSTEMS:  Review of systems is unremarkable.   PHYSICAL EXAMINATION:  VITAL SIGNS:  On examination, blood pressure 170/80,  pulse 80, respirations 18, weight 240 pounds.  HEENT:  Unremarkable.  NECK:  Neck is supple without JVD or bruit.  CHEST:  Chest is clear.  BREASTS:  Breasts reveal a large mass of the left breast, normal right  breast.  ABDOMEN:  Abdomen is soft, nontender.  No masses.  EXTREMITIES:  No cyanosis, clubbing or edema.  NEUROLOGIC:  No focal motor, sensory or cerebellar deficit.   IMPRESSION:  1. Left breast mass.  2. History of prostate cancer.  3.  Hypertension.  4. Gastroesophageal reflux disease.   PLAN:  The patient will have subcutaneous mastectomy, left breast.     ___________________________________________                                         Dirk Dress. Katrinka Blazing, M.D.   LCS/MEDQ  D:  05/07/2003  T:  05/08/2003  Job:  [id number]   cc:   Dr. Linna Darner

## 2010-07-29 NOTE — Discharge Summary (Signed)
NAME:  Danny Martinez, Danny Martinez                         ACCOUNT NO.:  [id number]   MEDICAL RECORD NO.:  [id number]                   PATIENT TYPE:  INP   LOCATION:  A220                                 FACILITY:  [facility]   PHYSICIAN:  Dirk Dress. Katrinka Blazing, M.D.                DATE OF BIRTH:  1933/09/14   DATE OF ADMISSION:  05/08/2003  DATE OF DISCHARGE:  05/09/2003                                 DISCHARGE SUMMARY   DISCHARGE DIAGNOSES:  1. Perioperative bradycardia with hypotension, treated and improved.  2. Left breast mass, not addressed during this hospitalization.   DISPOSITION:  The patient was discharged home in stable, satisfactory  condition.   FOLLOWUP:  1. He will have follow up with Dr. Dani Gobble for completion of his     cardiac workup.  2. He will also have a Cardiolite with Dr. Domingo Sep.  3. He will be seen in our office after the cardiac workup is completed, to     possibly be rescheduled for surgery.   SUMMARY:  A 77 year old male with a history of non-painful enlargement of  the left breast.  He has been on Eligard and progesterone for elevated PSA  post total prostatectomy.  There is no history of breast injury.  The  patient was admitted through Day Surgery to undergo a subcutaneous  mastectomy.  At the time of induction of anesthesia, the patient had  profound hypotension with a systolic blood pressure of 50 with bradycardia  of 48-50 and hypoxemia with saturations in the 70's range with induction.  He responded poorly to IV fluids, Narcan and atropine.  He eventually  stabilized with blood pressure 170/80, pulse 82, saturations of 100%.  The  patient moved all extremities upon awakening.  He could breath without  difficulty.  It was felt that he needed to be monitored post procedure.  Surgery was cancelled.  The patient was monitored in the recovery room and  did well.  he was monitored over the next 24 hours, and he had no further  difficulty.  He was seen in  consultation by Dr. Domingo Sep who felt that he  needed to have a more involved cardiac workup before considering surgery.   DISCHARGE CONDITION:  He was therefore discharged home in satisfactory  condition with plans for follow up by Dr. Domingo Sep.     ___________________________________________                                         Dirk Dress. Katrinka Blazing, M.D.   LCS/MEDQ  D:  06/06/2003  T:  06/07/2003  Job:  [id number]

## 2010-09-28 ENCOUNTER — Encounter: Payer: Self-pay | Admitting: Pulmonary Disease

## 2010-09-28 ENCOUNTER — Encounter: Payer: Self-pay | Admitting: *Deleted

## 2010-09-29 ENCOUNTER — Encounter: Payer: Self-pay | Admitting: Pulmonary Disease

## 2010-09-29 ENCOUNTER — Ambulatory Visit (INDEPENDENT_AMBULATORY_CARE_PROVIDER_SITE_OTHER)
Admission: RE | Admit: 2010-09-29 | Discharge: 2010-09-29 | Disposition: A | Discharge status: Home / Self Care | Payer: Medicare Other | Source: Ambulatory Visit | Attending: Pulmonary Disease | Admitting: Pulmonary Disease

## 2010-09-29 ENCOUNTER — Ambulatory Visit (INDEPENDENT_AMBULATORY_CARE_PROVIDER_SITE_OTHER): Payer: Medicare Other | Admitting: Pulmonary Disease

## 2010-09-29 VITALS — BP 136/72 | HR 77 | Temp 97.4°F | Ht 73.0 in | Wt 247.8 lb

## 2010-09-29 DIAGNOSIS — J449 Chronic obstructive pulmonary disease, unspecified: Secondary | ICD-10-CM

## 2010-09-29 DIAGNOSIS — I270 Primary pulmonary hypertension: Secondary | ICD-10-CM

## 2010-09-29 NOTE — Progress Notes (Signed)
  Subjective:    Patient ID: Danny Martinez, male    DOB: 1934-01-04, 77 y.o.   MRN: [id number]  HPI PCP - [facility]- degent  77/M with severe COPD and predominant bullous emphysema & pulmonary hypertension  FEV1 of 24% in September 2008, with a significant bronchodilator response. He has been maintained on two liters of oxygen due to desaturation on exertion.  6/09 >>Presented with asymptomatic  irregular heart rate . Holter monitor >> no evidence of atrial fibrillation, but rather of sinus arrhythmia and frequent premature atrial contractions. There was also frequent sinus tachycardia.  Echo> severe pulmonary hypertension with PA pressure of 62 mmHg out of proportion to degree of MR, but normal RV function.  09/29/2010 FU after 3 yr No h/o frequent flares - has done reasonably well. Compliant with O2 - desatn to 85% on ambulation should recertify him for lifelong O2 Echo nov'10 RVSP 43, but RV dilated   Review of Systems Pt denies any significant  nasal congestion or excess secretions, fever, chills, sweats, unintended wt loss, pleuritic or exertional cp, orthopnea pnd or leg swelling.  Pt also denies any obvious fluctuation in symptoms with weather or environmental change or other alleviating or aggravating factors.    Pt denies any increase in rescue therapy over baseline, denies waking up needing it or having early am exacerbations or coughing/wheezing/ or dyspnea      Objective:   Physical Exam Gen. Pleasant, well-nourished, in no distress ENT - no lesions, no post nasal drip Neck: No JVD, no thyromegaly, no carotid bruits Lungs: no use of accessory muscles, no dullness to percussion, clear without rales or rhonchi  Cardiovascular: Rhythm regular, heart sounds  normal, no murmurs or gallops, no peripheral edema Musculoskeletal: No deformities, no cyanosis or clubbing         Assessment & Plan:

## 2010-09-29 NOTE — Progress Notes (Signed)
SATURATION QUALIFICATIONS:  Patient Saturations on Room Air at Rest = 91%  Patient Saturations on Room Air while Ambulating = 85%  Patient Saturations on 2 Liters of oxygen while Ambulating = 93%

## 2010-09-29 NOTE — Patient Instructions (Addendum)
Chest xray today Stay on advair & spiriva

## 2010-09-30 NOTE — Assessment & Plan Note (Signed)
Stay on advair & spiriva 

## 2010-09-30 NOTE — Assessment & Plan Note (Signed)
RVSP lower on FU echo with no evidence of rt heart failure This could reflect improvement in PA pressure or paradoxically worsening RV function Regardless, this is secondary to COPD & vasodilators not indicated. Ct O2

## 2010-10-04 NOTE — Progress Notes (Signed)
Quick Note:  I informed pt of RA's findings and recommendations. Pt verbalized understanding  ______ 

## 2010-12-26 LAB — CBC
HCT: 28.3 — ABNORMAL LOW
HCT: 28.8 — ABNORMAL LOW
HCT: 29.7 — ABNORMAL LOW
HCT: 30 — ABNORMAL LOW
HCT: 30.4 — ABNORMAL LOW
HCT: 30.5 — ABNORMAL LOW
HCT: 31.9 — ABNORMAL LOW
HCT: 34.9 — ABNORMAL LOW
Hemoglobin: 10.4 — ABNORMAL LOW
Hemoglobin: 11.4 — ABNORMAL LOW
Hemoglobin: 9.3 — ABNORMAL LOW
Hemoglobin: 9.3 — ABNORMAL LOW
Hemoglobin: 9.7 — ABNORMAL LOW
Hemoglobin: 9.7 — ABNORMAL LOW
Hemoglobin: 9.9 — ABNORMAL LOW
Hemoglobin: 9.9 — ABNORMAL LOW
MCHC: 32.2
MCHC: 32.4
MCHC: 32.4
MCHC: 32.5
MCHC: 32.6
MCHC: 32.6
MCHC: 32.7
MCHC: 32.7
MCHC: 32.8
MCHC: 32.9
MCV: 87.4
MCV: 87.8
MCV: 88.3
MCV: 88.5
MCV: 88.6
MCV: 88.9
Platelets: 159
Platelets: 166
Platelets: 176
Platelets: 180
Platelets: 216
Platelets: 297
Platelets: 349
Platelets: 353
RBC: 3.2 — ABNORMAL LOW
RBC: 3.25 — ABNORMAL LOW
RBC: 3.37 — ABNORMAL LOW
RBC: 3.46 — ABNORMAL LOW
RBC: 3.49 — ABNORMAL LOW
RBC: 3.58 — ABNORMAL LOW
RBC: 3.91 — ABNORMAL LOW
RBC: 3.92 — ABNORMAL LOW
RDW: 14.1 — ABNORMAL HIGH
RDW: 14.4 — ABNORMAL HIGH
RDW: 14.6 — ABNORMAL HIGH
RDW: 14.6 — ABNORMAL HIGH
RDW: 14.6 — ABNORMAL HIGH
RDW: 14.7 — ABNORMAL HIGH
RDW: 14.9 — ABNORMAL HIGH
RDW: 14.9 — ABNORMAL HIGH
RDW: 15 — ABNORMAL HIGH
WBC: 7.2
WBC: 7.8
WBC: 8.3
WBC: 8.8
WBC: 9.5
WBC: 9.6

## 2010-12-26 LAB — BASIC METABOLIC PANEL
BUN: 41 — ABNORMAL HIGH
BUN: 43 — ABNORMAL HIGH
Chloride: 99
Creatinine, Ser: 2.59 — ABNORMAL HIGH
GFR calc non Af Amer: 25 — ABNORMAL LOW
Glucose, Bld: 100 — ABNORMAL HIGH
Glucose, Bld: 129 — ABNORMAL HIGH
Potassium: 4.4
Potassium: 4.4
Sodium: 127 — ABNORMAL LOW

## 2010-12-26 LAB — DIFFERENTIAL
Basophils Absolute: 0
Basophils Absolute: 0.1
Basophils Relative: 0
Basophils Relative: 1
Eosinophils Absolute: 0.4
Eosinophils Absolute: 0.5
Eosinophils Relative: 5
Monocytes Absolute: 1.1 — ABNORMAL HIGH
Monocytes Relative: 11
Monocytes Relative: 15 — ABNORMAL HIGH
Neutro Abs: 5.1
Neutro Abs: 6.6
Neutrophils Relative %: 66

## 2010-12-26 LAB — POCT I-STAT 3, ART BLOOD GAS (G3+)
Acid-base deficit: 3 — ABNORMAL HIGH
Acid-base deficit: 3 — ABNORMAL HIGH
Acid-base deficit: 4 — ABNORMAL HIGH
Acid-base deficit: 4 — ABNORMAL HIGH
Acid-base deficit: 4 — ABNORMAL HIGH
Bicarbonate: 22.9
Bicarbonate: 23.1
Bicarbonate: 23.1
Bicarbonate: 23.6
O2 Saturation: 94
O2 Saturation: 97
O2 Saturation: 97
O2 Saturation: 98
O2 Saturation: 99
Operator id: 270211
Patient temperature: 97.7
Patient temperature: 98.6
Patient temperature: 98.7
TCO2: 22
TCO2: 24
TCO2: 25
TCO2: 25
pCO2 arterial: 38.6
pCO2 arterial: 42.6
pCO2 arterial: 51.4 — ABNORMAL HIGH
pH, Arterial: 7.317 — ABNORMAL LOW
pH, Arterial: 7.342 — ABNORMAL LOW
pO2, Arterial: 102 — ABNORMAL HIGH
pO2, Arterial: 110 — ABNORMAL HIGH
pO2, Arterial: 122 — ABNORMAL HIGH
pO2, Arterial: 79 — ABNORMAL LOW

## 2010-12-26 LAB — OSMOLALITY, URINE: Osmolality, Ur: 479

## 2010-12-26 LAB — BASIC METABOLIC PANEL WITH GFR
BUN: 21
BUN: 30 — ABNORMAL HIGH
BUN: 38 — ABNORMAL HIGH
CO2: 24
CO2: 28
CO2: 28
Calcium: 8.6
Calcium: 9.1
Calcium: 9.5
Chloride: 106
Chloride: 109
Chloride: 109
Creatinine, Ser: 1.58 — ABNORMAL HIGH
Creatinine, Ser: 1.98 — ABNORMAL HIGH
Creatinine, Ser: 2.14 — ABNORMAL HIGH
GFR calc non Af Amer: 31 — ABNORMAL LOW
GFR calc non Af Amer: 33 — ABNORMAL LOW
GFR calc non Af Amer: 43 — ABNORMAL LOW
Glucose, Bld: 79
Glucose, Bld: 80
Glucose, Bld: 92
Potassium: 4.4
Potassium: 4.6
Potassium: 4.6
Sodium: 138
Sodium: 140
Sodium: 143

## 2010-12-26 LAB — URINALYSIS, ROUTINE W REFLEX MICROSCOPIC
Bilirubin Urine: NEGATIVE
Ketones, ur: NEGATIVE
Nitrite: NEGATIVE
Urobilinogen, UA: 0.2

## 2010-12-26 LAB — CULTURE, BLOOD (ROUTINE X 2)
Culture: NO GROWTH
Culture: NO GROWTH

## 2010-12-26 LAB — PHOSPHORUS: Phosphorus: 4

## 2010-12-26 LAB — COMPREHENSIVE METABOLIC PANEL
ALT: 24
AST: 31
CO2: 23
Chloride: 99
Creatinine, Ser: 3.49 — ABNORMAL HIGH
GFR calc Af Amer: 21 — ABNORMAL LOW
GFR calc non Af Amer: 17 — ABNORMAL LOW
Total Bilirubin: 0.5

## 2010-12-26 LAB — RENAL FUNCTION PANEL
BUN: 44 — ABNORMAL HIGH
CO2: 23
Calcium: 8.3 — ABNORMAL LOW
Creatinine, Ser: 2.87 — ABNORMAL HIGH
Glucose, Bld: 84
Phosphorus: 3.7
Sodium: 130 — ABNORMAL LOW

## 2010-12-26 LAB — URINE CULTURE
Colony Count: NO GROWTH
Culture: NO GROWTH
Special Requests: NEGATIVE

## 2010-12-26 LAB — CARDIAC PANEL(CRET KIN+CKTOT+MB+TROPI)
CK, MB: 4.3 — ABNORMAL HIGH
CK, MB: 7.4 — ABNORMAL HIGH
Relative Index: 2
Relative Index: 2.6 — ABNORMAL HIGH
Troponin I: 0.14 — ABNORMAL HIGH
Troponin I: 0.18 — ABNORMAL HIGH
Troponin I: 0.29 — ABNORMAL HIGH
Troponin I: 0.42 — ABNORMAL HIGH

## 2010-12-26 LAB — CULTURE, BAL-QUANTITATIVE W GRAM STAIN

## 2010-12-26 LAB — STREP PNEUMONIAE URINARY ANTIGEN: Strep Pneumo Urinary Antigen: NEGATIVE

## 2010-12-26 LAB — HEPARIN LEVEL (UNFRACTIONATED): Heparin Unfractionated: 0.48

## 2010-12-26 LAB — MAGNESIUM: Magnesium: 2.1

## 2010-12-26 LAB — PROTIME-INR: INR: 1.2

## 2011-10-02 ENCOUNTER — Ambulatory Visit (INDEPENDENT_AMBULATORY_CARE_PROVIDER_SITE_OTHER): Payer: Medicare Other | Admitting: Pulmonary Disease

## 2011-10-02 ENCOUNTER — Telehealth: Payer: Self-pay | Admitting: Pulmonary Disease

## 2011-10-02 ENCOUNTER — Ambulatory Visit (INDEPENDENT_AMBULATORY_CARE_PROVIDER_SITE_OTHER)
Admission: RE | Admit: 2011-10-02 | Discharge: 2011-10-02 | Disposition: A | Discharge status: Home / Self Care | Payer: Medicare Other | Source: Ambulatory Visit | Attending: Pulmonary Disease | Admitting: Pulmonary Disease

## 2011-10-02 ENCOUNTER — Encounter: Payer: Self-pay | Admitting: Pulmonary Disease

## 2011-10-02 VITALS — BP 140/70 | HR 71 | Temp 98.2°F | Ht 73.5 in | Wt 245.0 lb

## 2011-10-02 DIAGNOSIS — I270 Primary pulmonary hypertension: Secondary | ICD-10-CM

## 2011-10-02 DIAGNOSIS — J449 Chronic obstructive pulmonary disease, unspecified: Secondary | ICD-10-CM

## 2011-10-02 DIAGNOSIS — R69 Illness, unspecified: Secondary | ICD-10-CM

## 2011-10-02 NOTE — Telephone Encounter (Signed)
He desatns on pulse O2 Really needs continuous O2 even while ambulating. Pl have DME evaluate if pt is ok with this

## 2011-10-02 NOTE — Patient Instructions (Addendum)
Chest xray today Walk with 2 L oxygen & check satn We discussed early signs of bronchitis

## 2011-10-02 NOTE — Assessment & Plan Note (Signed)
Ct advair, spiriva

## 2011-10-02 NOTE — Assessment & Plan Note (Signed)
desatn on pulse O2 on ambulation even on 6L - he really needs continuous O2

## 2011-10-02 NOTE — Assessment & Plan Note (Signed)
11/10 RVSP lower on FU echo with no evidence of rt heart failure Fu echo if symptoms of cor pulmonale

## 2011-10-02 NOTE — Progress Notes (Signed)
  Subjective:    Patient ID: Danny Martinez, male    DOB: 12/11/1933, 78 y.o.   MRN: [id number]  HPI PCP - [facility]  Cards- degent   [name] with severe COPD and predominant bullous emphysema & pulmonary hypertension  FEV1 of 24% in September 2008, with a significant bronchodilator response.  6/09 >>Presented with asymptomatic irregular heart rate . Holter monitor >> no evidence of atrial fibrillation, but rather of sinus arrhythmia and frequent premature atrial contractions. There was also frequent sinus tachycardia.  Echo> severe pulmonary hypertension with PA pressure of 62 mmHg out of proportion to degree of MR, but normal RV function.  Echo nov'10 RVSP 43, but RV dilated   10/02/2011 Annual FU  No h/o frequent flares - has done reasonably well. He uses 2.5L pulse on walking & 2L continuous at home Compliant with O2 - today he desatn on pulse O2 while walking inspite of increasing to 6L. Compliant with advair & spiriva. Has tolerated atenolol CXR  - no infx or effusions  Review of Systems Patient denies significant dyspnea,cough, hemoptysis,  chest pain, palpitations, pedal edema, orthopnea, paroxysmal nocturnal dyspnea, lightheadedness, nausea, vomiting, abdominal or  leg pains      Objective:   Physical Exam  Gen. Pleasant, well-nourished, in no distress ENT - no lesions, no post nasal drip Neck: No JVD, no thyromegaly, no carotid bruits Lungs: no use of accessory muscles, no dullness to percussion, decreased without rales or rhonchi  Cardiovascular: Rhythm regular, heart sounds  normal, no murmurs or gallops, no peripheral edema Musculoskeletal: No deformities, no cyanosis or clubbing         Assessment & Plan:

## 2011-10-04 NOTE — Telephone Encounter (Signed)
I spoke with pt and he states he already has portable tanks and the "big tanks" at home that goes on continuous flow. I advised pt he needed to use the continuous oxygen instead of the pulse oxygen. He voiced his understanding. He stated he never received his AVS from his visit and would like this mailed to him as well as a copy of his CXR results. Confirmed pt address and have placed this in the mail for him. Will forward to RA so he is aware

## 2011-10-05 NOTE — Telephone Encounter (Signed)
ok 

## 2012-04-29 ENCOUNTER — Telehealth: Payer: Self-pay | Admitting: Pulmonary Disease

## 2012-04-29 NOTE — Telephone Encounter (Signed)
??   Not sure what Romeo Apple is requesting pt last seen 10/02/11, not referral in qorkque from Dr Vassie Loll. Left msg for [facility] to call me back.  I have no cmn from this co .Kandice Hams

## 2012-05-01 NOTE — Telephone Encounter (Signed)
Spoke with Danny Martinez, who says under "medicare guidelines, pt is due for followup of his 02 for compliance. Pt last seen 09/2011.  Pt sched ov .Kandice Hams

## 2012-05-06 ENCOUNTER — Encounter: Payer: Self-pay | Admitting: Pulmonary Disease

## 2012-05-06 ENCOUNTER — Ambulatory Visit (INDEPENDENT_AMBULATORY_CARE_PROVIDER_SITE_OTHER): Payer: Medicare Other | Admitting: Pulmonary Disease

## 2012-05-06 VITALS — BP 128/68 | HR 63 | Temp 97.7°F | Ht 73.5 in | Wt 246.2 lb

## 2012-05-06 DIAGNOSIS — I270 Primary pulmonary hypertension: Secondary | ICD-10-CM

## 2012-05-06 DIAGNOSIS — J449 Chronic obstructive pulmonary disease, unspecified: Secondary | ICD-10-CM

## 2012-05-06 NOTE — Patient Instructions (Addendum)
Use 4-6 L pulse O2 when walking Adavir /spiriva samples

## 2012-05-06 NOTE — Progress Notes (Signed)
  Subjective:    Patient ID: Danny Martinez, male    DOB: 1934/01/26, 80 y.o.   MRN: [id number]  HPI PCP - [facility]  Cards- degent   [name] with severe COPD and predominant bullous emphysema & pulmonary hypertension  FEV1 of 24% in September 2008, with a significant bronchodilator response.  6/09 >>Presented with asymptomatic irregular heart rate . Holter monitor >> no evidence of atrial fibrillation, but rather of sinus arrhythmia and frequent premature atrial contractions. There was also frequent sinus tachycardia.  Echo> severe pulmonary hypertension with PA pressure of 62 mmHg out of proportion to degree of MR, but normal RV function.  Echo nov'10 RVSP 43, but RV dilated   10/02/2011  he desatn on pulse O2 while walking inspite of increasing to 6L.   CXR - no infx or effusions   05/06/2012  Pt states his breathing has been okay. has good and bad days. denies any cough. no wheezing, no chest tx. Per pt he wears O2 24/7 3 liters. Portable goes upto 6L Compliant with advair & spiriva. Has tolerated atenolol  Advair/spiriva too expensive  Review of Systems neg for any significant sore throat, dysphagia, itching, sneezing, nasal congestion or excess/ purulent secretions, fever, chills, sweats, unintended wt loss, pleuritic or exertional cp, hempoptysis, orthopnea pnd or change in chronic leg swelling. Also denies presyncope, palpitations, heartburn, abdominal pain, nausea, vomiting, diarrhea or change in bowel or urinary habits, dysuria,hematuria, rash, arthralgias, visual complaints, headache, numbness weakness or ataxia.     Objective:   Physical Exam  Gen. Pleasant, well-nourished, in no distress ENT - no lesions, no post nasal drip Neck: No JVD, no thyromegaly, no carotid bruits Lungs: no use of accessory muscles, no dullness to percussion, decresaed without rales or rhonchi  Cardiovascular: Rhythm regular, heart sounds  normal, no murmurs or gallops, no peripheral  edema Musculoskeletal: No deformities, no cyanosis or clubbing        Assessment & Plan:

## 2012-05-07 NOTE — Assessment & Plan Note (Signed)
11/10 RVSP lower on FU echo with no evidence of rt heart failure

## 2012-05-07 NOTE — Assessment & Plan Note (Signed)
Use 5-6 L pulse O2 when walking Ct Adavir Sherrye Payor

## 2012-10-28 ENCOUNTER — Ambulatory Visit (INDEPENDENT_AMBULATORY_CARE_PROVIDER_SITE_OTHER): Payer: Medicare Other | Admitting: Adult Health

## 2012-10-28 ENCOUNTER — Encounter: Payer: Self-pay | Admitting: Adult Health

## 2012-10-28 VITALS — BP 112/60 | HR 59 | Temp 98.0°F | Ht 73.5 in | Wt 244.4 lb

## 2012-10-28 DIAGNOSIS — J449 Chronic obstructive pulmonary disease, unspecified: Secondary | ICD-10-CM

## 2012-10-28 DIAGNOSIS — R69 Illness, unspecified: Secondary | ICD-10-CM

## 2012-10-28 NOTE — Patient Instructions (Addendum)
Use 4-6 L pulse O2 when walking Continue Adavir /spiriva  Get flu shot this fall.  Follow up Dr. Vassie Loll  In 6 months

## 2012-10-29 NOTE — Assessment & Plan Note (Signed)
Compensated on present regimen   Plan  Use 4-6 L pulse O2 when walking Continue Adavir /spiriva  Get flu shot this fall.  Follow up Dr. Vassie Loll  In 6 months

## 2012-10-29 NOTE — Progress Notes (Signed)
  Subjective:    Patient ID: Danny Martinez, male    DOB: March 20, 1933, 79 y.o.   MRN: [id number]  HPI  PCP - [facility]  Cards- degent   79/M with severe COPD and predominant bullous emphysema & pulmonary hypertension  FEV1 of 24% in September 2008, with a significant bronchodilator response.  6/09 >>Presented with asymptomatic irregular heart rate . Holter monitor >> no evidence of atrial fibrillation, but rather of sinus arrhythmia and frequent premature atrial contractions. There was also frequent sinus tachycardia.  Echo> severe pulmonary hypertension with PA pressure of 62 mmHg out of proportion to degree of MR, but normal RV function.  Echo nov'10 RVSP 43, but RV dilated   10/02/2011  he desatn on pulse O2 while walking inspite of increasing to 6L.   CXR - no infx or effusions   05/06/12 Follow up  Pt states his breathing has been okay. has good and bad days. denies any cough. no wheezing, no chest tx. Per pt he wears O2 24/7 3 liters. Portable goes upto 6L Compliant with advair & spiriva. Has tolerated atenolol  Advair/spiriva too expensive >no changes  10/28/12 Follow up  Returns for 6 month follow up for COPD -O2 dependent Remains on Advair and spiriva .  Wears O2 24/7. Uses portable concentrator with walking.  No flare of cough, wheezing, or dyspnea.  Denies cp, orthopnea, edema or nv/    Review of Systems  neg for any significant sore throat, dysphagia, itching, sneezing, nasal congestion or excess/ purulent secretions, fever, chills, sweats, unintended wt loss, pleuritic or exertional cp, hempoptysis, orthopnea pnd or change in chronic leg swelling. Also denies presyncope, palpitations, heartburn, abdominal pain, nausea, vomiting, diarrhea or change in bowel or urinary habits, dysuria,hematuria, rash, arthralgias, visual complaints, headache, numbness weakness or ataxia.     Objective:   Physical Exam   Gen. Pleasant, well-nourished, in no distress ENT - no lesions,  no post nasal drip Neck: No JVD, no thyromegaly, no carotid bruits Lungs: no use of accessory muscles, no dullness to percussion, decresaed without rales or rhonchi  Cardiovascular: Rhythm regular, heart sounds  normal, no murmurs or gallops, no peripheral edema Musculoskeletal: No deformities, no cyanosis or clubbing        Assessment & Plan:

## 2012-10-29 NOTE — Assessment & Plan Note (Signed)
Use 4-6 L pulse O2 when walking Continue Adavir /spiriva  Get flu shot this fall.  Follow up Dr. Alva  In 6 months  

## 2012-11-04 ENCOUNTER — Ambulatory Visit: Payer: Medicare Other | Admitting: Adult Health

## 2012-11-18 ENCOUNTER — Telehealth: Payer: Self-pay | Admitting: Pulmonary Disease

## 2012-11-18 NOTE — Telephone Encounter (Signed)
I spoke with pt. He is needing his OV notes faxed over to his PCP. I advised pt will do so. Nothing further needed records have been faxed

## 2013-01-16 ENCOUNTER — Other Ambulatory Visit: Payer: Self-pay

## 2013-05-13 ENCOUNTER — Encounter: Payer: Self-pay | Admitting: Pulmonary Disease

## 2013-05-13 ENCOUNTER — Ambulatory Visit (INDEPENDENT_AMBULATORY_CARE_PROVIDER_SITE_OTHER)
Admission: RE | Admit: 2013-05-13 | Discharge: 2013-05-13 | Disposition: A | Discharge status: Home / Self Care | Payer: Medicare Other | Source: Ambulatory Visit | Attending: Pulmonary Disease | Admitting: Pulmonary Disease

## 2013-05-13 ENCOUNTER — Ambulatory Visit (INDEPENDENT_AMBULATORY_CARE_PROVIDER_SITE_OTHER): Payer: Medicare Other | Admitting: Pulmonary Disease

## 2013-05-13 VITALS — BP 144/78 | HR 113 | Temp 98.1°F | Ht 73.5 in | Wt 249.8 lb

## 2013-05-13 DIAGNOSIS — J449 Chronic obstructive pulmonary disease, unspecified: Secondary | ICD-10-CM

## 2013-05-13 DIAGNOSIS — I270 Primary pulmonary hypertension: Secondary | ICD-10-CM

## 2013-05-13 NOTE — Assessment & Plan Note (Signed)
CXR today We can have nurse contact you about research study for COPD medications -IMPACT, he is willing to consider [facility]  For [facility]

## 2013-05-13 NOTE — Progress Notes (Signed)
   Subjective:    Patient ID: Danny Martinez, male    DOB: 01-01-1934, 80 y.o.   MRN: [id number]  HPI  PCP - [facility]  Cards- degent   79/M with severe COPD and predominant bullous emphysema & pulmonary hypertension  FEV1 of 24% in September 2008, with a significant bronchodilator response.  6/09 >>Presented with asymptomatic irregular heart rate . Holter monitor >> no evidence of atrial fibrillation, but rather of sinus arrhythmia and frequent premature atrial contractions. There was also frequent sinus tachycardia.  Echo> severe pulmonary hypertension with PA pressure of 62 mmHg out of proportion to degree of MR, but normal RV function.  Echo nov'10 RVSP 43, but RV dilated  10/02/2011 he desatn on pulse O2 while walking inspite of increasing to 6L.  CXR - no infx or effusions   05/13/2013  Saw optometrist - told changes in retina c/w high BP Uses respironics 'evergo' (portable conc)- upto 3-5L/m  he wears O2 24/7 3 liters.  Portable goes upto 6L  Compliant with advair & spiriva. Has tolerated atenolol  Advair/spiriva too expensive   No flare of cough, wheezing, or dyspnea.  Denies cp, orthopnea, edema or nv/      Review of Systems neg for any significant sore throat, dysphagia, itching, sneezing, nasal congestion or excess/ purulent secretions, fever, chills, sweats, unintended wt loss, pleuritic or exertional cp, hempoptysis, orthopnea pnd or change in chronic leg swelling. Also denies presyncope, palpitations, heartburn, abdominal pain, nausea, vomiting, diarrhea or change in bowel or urinary habits, dysuria,hematuria, rash, arthralgias, visual complaints, headache, numbness weakness or ataxia.     Objective:   Physical Exam  Gen. Pleasant, in no distress ENT - no lesions, no post nasal drip Neck: No JVD, no thyromegaly, no carotid bruits Lungs: no use of accessory muscles, no dullness to percussion, decreased without rales or rhonchi  Cardiovascular: Rhythm regular, heart  sounds  normal, no murmurs or gallops, no peripheral edema Musculoskeletal: No deformities, no cyanosis or clubbing , no tremors       Assessment & Plan:

## 2013-05-13 NOTE — Assessment & Plan Note (Signed)
Fu echo annually

## 2013-05-13 NOTE — Patient Instructions (Signed)
CXR today We can have nurse contact you about research study for COPD medications [facility]  For meds

## 2013-05-14 NOTE — Progress Notes (Signed)
Quick Note:  Called and spoke with pt about results of CXR per RA. Pt confirmed understanding and voiced no other concerns at this time. ______

## 2013-06-05 ENCOUNTER — Encounter: Payer: Self-pay | Admitting: Pulmonary Disease

## 2013-10-30 ENCOUNTER — Encounter: Payer: Self-pay | Admitting: Pulmonary Disease

## 2013-10-30 ENCOUNTER — Encounter: Payer: Self-pay | Admitting: *Deleted

## 2013-10-30 ENCOUNTER — Ambulatory Visit (INDEPENDENT_AMBULATORY_CARE_PROVIDER_SITE_OTHER): Payer: Medicare Other | Admitting: Pulmonary Disease

## 2013-10-30 VITALS — BP 122/70 | HR 99 | Temp 97.1°F | Ht 73.5 in | Wt 249.6 lb

## 2013-10-30 DIAGNOSIS — J449 Chronic obstructive pulmonary disease, unspecified: Secondary | ICD-10-CM

## 2013-10-30 DIAGNOSIS — I270 Primary pulmonary hypertension: Secondary | ICD-10-CM

## 2013-10-30 NOTE — Assessment & Plan Note (Signed)
Letter will be sent to the [facility]- prefer he stays on advair instead of symbicort Sample of advair 250 x1 Call as needed

## 2013-10-30 NOTE — Progress Notes (Signed)
   Subjective:    Patient ID: Danny Martinez, male    DOB: 02-13-1934, 80 y.o.   MRN: [id number]  HPI  PCP - Hassani  Cards- degent   80/M with severe COPD and predominant bullous emphysema & pulmonary hypertension  FEV1 of 24% in September 2008, with a significant bronchodilator response.  6/09 >>Presented with asymptomatic irregular heart rate . Holter monitor >> no evidence of atrial fibrillation, but rather of sinus arrhythmia and frequent premature atrial contractions. There was also frequent sinus tachycardia.  Echo> severe pulmonary hypertension with PA pressure of 62 mmHg out of proportion to degree of MR, but normal RV function.  Echo nov'10 RVSP 43, but RV dilated  10/02/2011 he desatn on pulse O2 while walking inspite of increasing to 6L.  CXR - no infx or effusions   10/30/2013  Chief Complaint  Patient presents with  . Follow-up    Pt reports his breathing has been doing good. He usually has problems with breathing when it is humid/hot. no wheezing, no chest tx, no cough. pt uses 3.5 liters O2 24/7.     Uses respironics 'evergo' (portable conc)- upto 3-5L/m  he wears O2 24/7 3 liters.  Portable goes upto 6L  Compliant with advair & spiriva. Has tolerated atenolol  Advair/spiriva too expensive - able ot get spiriva from the [place], got symbicort instead of advair but caused sinus drip No flare of cough, wheezing, or dyspnea.  Denies cp, orthopnea, edema or nv/    Review of Systems neg for any significant sore throat, dysphagia, itching, sneezing, nasal congestion or excess/ purulent secretions, fever, chills, sweats, unintended wt loss, pleuritic or exertional cp, hempoptysis, orthopnea pnd or change in chronic leg swelling. Also denies presyncope, palpitations, heartburn, abdominal pain, nausea, vomiting, diarrhea or change in bowel or urinary habits, dysuria,hematuria, rash, arthralgias, visual complaints, headache, numbness weakness or ataxia.      Objective:   Physical Exam  Gen. Pleasant, well-nourished, in no distress ENT - no lesions, no post nasal drip Neck: No JVD, no thyromegaly, no carotid bruits Lungs: no use of accessory muscles, no dullness to percussion, decreased BL without rales or rhonchi  Cardiovascular: Rhythm regular, heart sounds  normal, no murmurs or gallops, no peripheral edema Musculoskeletal: No deformities, no cyanosis or clubbing         Assessment & Plan:

## 2013-10-30 NOTE — Assessment & Plan Note (Signed)
Echo test for right heart pressure

## 2013-10-30 NOTE — Patient Instructions (Signed)
Letter will be sent to the [facility] Sample of advair 250 x1 Echo test for right heart pressure Call as needed

## 2013-11-04 ENCOUNTER — Ambulatory Visit: Payer: Medicare Other | Admitting: Internal Medicine

## 2013-11-12 ENCOUNTER — Other Ambulatory Visit (INDEPENDENT_AMBULATORY_CARE_PROVIDER_SITE_OTHER): Payer: Medicare Other

## 2013-11-12 ENCOUNTER — Other Ambulatory Visit: Payer: Self-pay

## 2013-11-12 DIAGNOSIS — I059 Rheumatic mitral valve disease, unspecified: Secondary | ICD-10-CM

## 2013-11-12 DIAGNOSIS — I270 Primary pulmonary hypertension: Secondary | ICD-10-CM

## 2014-05-04 ENCOUNTER — Encounter: Payer: Self-pay | Admitting: Adult Health

## 2014-05-04 ENCOUNTER — Ambulatory Visit (INDEPENDENT_AMBULATORY_CARE_PROVIDER_SITE_OTHER): Payer: Medicare Other | Admitting: Adult Health

## 2014-05-04 VITALS — BP 142/80 | HR 68 | Temp 98.6°F | Ht 73.5 in | Wt 251.8 lb

## 2014-05-04 DIAGNOSIS — J9611 Chronic respiratory failure with hypoxia: Secondary | ICD-10-CM

## 2014-05-04 DIAGNOSIS — J449 Chronic obstructive pulmonary disease, unspecified: Secondary | ICD-10-CM

## 2014-05-04 DIAGNOSIS — I270 Primary pulmonary hypertension: Secondary | ICD-10-CM

## 2014-05-04 NOTE — Progress Notes (Signed)
   Subjective:    Patient ID: Danny Martinez, male    DOB: 1933-10-12, 81 y.o.   MRN: [id number]  HPI  PCP - Hassani  Cards- degent   80/M with severe COPD and predominant bullous emphysema & pulmonary hypertension  FEV1 of 24% in September 2008, with a significant bronchodilator response.  6/09 >>Presented with asymptomatic irregular heart rate . Holter monitor >> no evidence of atrial fibrillation, but rather of sinus arrhythmia and frequent premature atrial contractions. There was also frequent sinus tachycardia.  Echo> severe pulmonary hypertension with PA pressure of 62 mmHg out of proportion to degree of MR, but normal RV function.  Echo nov'10 RVSP 43, but RV dilated  10/02/2011 he desatn on pulse O2 while walking inspite of increasing to 6L.  CXR - no infx or effusions     Uses respironics 'evergo' (portable conc)- upto 3-5L/m  he wears O2 24/7 3 liters.  Portable goes upto 6L  Compliant with advair & spiriva. Has tolerated atenolol  Advair/spiriva too expensive - able ot get spiriva from the [place], got symbicort instead of advair but caused sinus drip No flare of cough, wheezing, or dyspnea.  Denies cp, orthopnea, edema or nv/   05/04/2014 Follow up : COPD  Returns for a  6 month COPD follow up.  Reports breathing is doing well.  no new complaints.  CAT = 19. Remains on Advair and Spiriva. Remains on oxygen at 3.5 l/m Has a portable concentrator for activity and is working well. He denies any hemoptysis, chest pain, orthopnea, PND, or increased leg swelling.    Review of Systems neg for any significant sore throat, dysphagia, itching, sneezing, nasal congestion or excess/ purulent secretions, fever, chills, sweats, unintended wt loss, pleuritic or exertional cp, hempoptysis, orthopnea pnd or change in chronic leg swelling. Also denies presyncope, palpitations, heartburn, abdominal pain, nausea, vomiting, diarrhea or change in bowel or urinary habits, dysuria,hematuria,  rash, arthralgias, visual complaints, headache, numbness weakness or ataxia.      Objective:   Physical Exam  Gen. Pleasant, elderly on oxygen, in no distress ENT - no lesions, no post nasal drip Neck: No JVD, no thyromegaly, no carotid bruits Lungs: no use of accessory muscles, no dullness to percussion, decreased BL without rales or rhonchi  Cardiovascular: Rhythm regular, heart sounds  normal, no murmurs or gallops, no peripheral edema Musculoskeletal: No deformities, no cyanosis or clubbing         Assessment & Plan:

## 2014-05-04 NOTE — Assessment & Plan Note (Signed)
Compensated on oxygen  Plan Continue Adavir /spiriva  Continue on Oxygen .  Follow up Dr. Elsworth Soho  In 6 months

## 2014-05-04 NOTE — Patient Instructions (Signed)
Continue Adavir /spiriva  Continue on Oxygen .  Follow up Dr. Elsworth Soho  In 6 months

## 2014-05-04 NOTE — Assessment & Plan Note (Signed)
Compensated on present regimen  Plan Continue Adavir /spiriva  Continue on Oxygen .  Follow up Dr. Elsworth Soho  In 6 months

## 2014-05-04 NOTE — Progress Notes (Signed)
Reviewed & agree with plan  

## 2014-05-04 NOTE — Assessment & Plan Note (Signed)
Continue on oxygen 

## 2014-05-29 LAB — PULMONARY FUNCTION TEST

## 2014-12-01 ENCOUNTER — Ambulatory Visit (INDEPENDENT_AMBULATORY_CARE_PROVIDER_SITE_OTHER): Payer: Medicare Other | Admitting: Pulmonary Disease

## 2014-12-01 ENCOUNTER — Encounter: Payer: Self-pay | Admitting: Pulmonary Disease

## 2014-12-01 VITALS — BP 132/82 | HR 54 | Ht 73.5 in | Wt 246.0 lb

## 2014-12-01 DIAGNOSIS — J9611 Chronic respiratory failure with hypoxia: Secondary | ICD-10-CM | POA: Diagnosis not present

## 2014-12-01 DIAGNOSIS — J432 Centrilobular emphysema: Secondary | ICD-10-CM | POA: Diagnosis not present

## 2014-12-01 DIAGNOSIS — I270 Primary pulmonary hypertension: Secondary | ICD-10-CM

## 2014-12-01 DIAGNOSIS — Z23 Encounter for immunization: Secondary | ICD-10-CM

## 2014-12-01 NOTE — Assessment & Plan Note (Signed)
Obtain PFT report from [facility] Stay on advair , spiriva Call for chest cold, leg swelling  Flu shot

## 2014-12-01 NOTE — Patient Instructions (Signed)
Obtain PFT report from [facility] Stay on advair , spiriva Call for chest cold, leg swelling  Flu shot

## 2014-12-01 NOTE — Assessment & Plan Note (Signed)
Stay on 3.5 L o2

## 2014-12-01 NOTE — Progress Notes (Signed)
   Subjective:    Patient ID: Danny Martinez, male    DOB: 29-Feb-1934, 81 y.o.   MRN: [id number]  HPI  PCP - Hassani  Cards- degent   81/M, remote smoker for FU of severe COPD and predominant bullous emphysema & pulmonary hypertension  11/2006 FEV1 of 24% , with a significant bronchodilator response.    12/01/2014  Chief Complaint  Patient presents with  . Follow-up    Breathing is doing fine.  No concerns.  flu shot   No Interim exacerbations requiring office visits or hospital admission. He had PFTs done at [facility] but never obtain results  Uses respironics 'evergo' (portable conc)- upto 3-5L/m  he wears O2 24/7 3 liters.  Portable goes upto 6L  He denies any hemoptysis, chest pain, orthopnea, PND, or increased leg swelling. Did try Symbicort in the past-but prefers Advair  Significant tests/ events  08/2007 Echo> severe pulmonary hypertension with PA pressure of 62 mmHg out of proportion to degree of MR, but normal RV function.  01/2009 RVSP 43, but RV dilated    Review of Systems neg for any significant sore throat, dysphagia, itching, sneezing, nasal congestion or excess/ purulent secretions, fever, chills, sweats, unintended wt loss, pleuritic or exertional cp, hempoptysis, orthopnea pnd or change in chronic leg swelling. Also denies presyncope, palpitations, heartburn, abdominal pain, nausea, vomiting, diarrhea or change in bowel or urinary habits, dysuria,hematuria, rash, arthralgias, visual complaints, headache, numbness weakness or ataxia.     Objective:   Physical Exam  Gen. Pleasant, well-nourished, in no distress ENT - no lesions, no post nasal drip Neck: No JVD, no thyromegaly, no carotid bruits Lungs: no use of accessory muscles, no dullness to percussion, decreased without rales or rhonchi  Cardiovascular: Rhythm regular, heart sounds  normal, no murmurs or gallops, no peripheral edema Musculoskeletal: No deformities, no cyanosis or clubbing         Assessment & Plan:

## 2014-12-01 NOTE — Assessment & Plan Note (Signed)
No evidence of acute cor pulmonale

## 2014-12-03 ENCOUNTER — Telehealth: Payer: Self-pay | Admitting: Pulmonary Disease

## 2014-12-03 NOTE — Telephone Encounter (Signed)
PFT report from [facility] shows:  Severe COPD - lung capacity 25% unchanged from prior

## 2014-12-04 NOTE — Telephone Encounter (Signed)
Patient notified. Copy of results mailed to patient per patient's request Address verified. Nothing further needed.

## 2015-04-17 DIAGNOSIS — J449 Chronic obstructive pulmonary disease, unspecified: Secondary | ICD-10-CM | POA: Diagnosis not present

## 2015-04-17 DIAGNOSIS — R0902 Hypoxemia: Secondary | ICD-10-CM | POA: Diagnosis not present

## 2015-05-06 DIAGNOSIS — C61 Malignant neoplasm of prostate: Secondary | ICD-10-CM | POA: Diagnosis not present

## 2015-05-15 DIAGNOSIS — R0902 Hypoxemia: Secondary | ICD-10-CM | POA: Diagnosis not present

## 2015-05-15 DIAGNOSIS — J449 Chronic obstructive pulmonary disease, unspecified: Secondary | ICD-10-CM | POA: Diagnosis not present

## 2015-05-31 ENCOUNTER — Ambulatory Visit (INDEPENDENT_AMBULATORY_CARE_PROVIDER_SITE_OTHER): Payer: Medicare Other | Admitting: Adult Health

## 2015-05-31 ENCOUNTER — Encounter: Payer: Self-pay | Admitting: Adult Health

## 2015-05-31 VITALS — BP 128/62 | HR 60 | Temp 97.8°F | Ht 73.0 in | Wt 233.0 lb

## 2015-05-31 DIAGNOSIS — J9611 Chronic respiratory failure with hypoxia: Secondary | ICD-10-CM | POA: Diagnosis not present

## 2015-05-31 DIAGNOSIS — J449 Chronic obstructive pulmonary disease, unspecified: Secondary | ICD-10-CM

## 2015-05-31 NOTE — Progress Notes (Signed)
Subjective:    Patient ID: Danny Martinez, male    DOB: [birth year], 82 y.o.   MRN: [id number]  HPI 81/M, remote smoker for FU of severe COPD and predominant bullous emphysema & pulmonary hypertension   TEST  11/2006 FEV1 of 24% , with a significant bronchodilator response.  08/2007 Echo> severe pulmonary hypertension with PA pressure of 62 mmHg out of proportion to degree of MR, but normal RV function.  01/2009 RVSP 43, but RV dilated   05/31/2015 Follow up : COPD O2 dependent  Pt returns for 6 month follow up .  Says he is doing very well. No flare of cough /dyspnea.  Remains on Advair and Spiriva .  Working on wt loss. Trying to be more active.  On O2 at 3.5l/m . On POC.  PVX and Prevnar are utd.  No ER /Hospital since last ov.  CXR 2015 w/ nad. Discussed repeat today, he declines.  Feels good.  No chest pain, orthopnea, edema or hemoptysis.     Past Medical History  Diagnosis Date  . PVC (premature ventricular contraction)   . Pulmonary hypertension ([place])   . Hypoxemia   . Emphysema   . COPD (chronic obstructive pulmonary disease) ([place])   . History of prostate cancer    '. Current Outpatient Prescriptions on File Prior to Visit  Medication Sig Dispense Refill  . albuterol (PROVENTIL) (2.5 MG/3ML) 0.083% nebulizer solution Take 2.5 mg by nebulization every 4 (four) hours as needed.      Marland Kitchen aspirin 81 MG tablet Take 81 mg by mouth daily.      Marland Kitchen atenolol (TENORMIN) 50 MG tablet Take 50 mg by mouth daily.      Marland Kitchen atorvastatin (LIPITOR) 20 MG tablet Take 20 mg by mouth at bedtime.    . Cholecalciferol (VITAMIN D-3) 5000 UNITS TABS Take 1 tablet by mouth daily.      . Fluticasone-Salmeterol (ADVAIR DISKUS) 250-50 MCG/DOSE AEPB Inhale 1 puff into the lungs every 12 (twelve) hours.      Marland Kitchen glimepiride (AMARYL) 1 MG tablet Take 1 mg by mouth daily before breakfast.    . OXYGEN Inhale 3.5 L into the lungs.    Marland Kitchen telmisartan-hydrochlorothiazide (MICARDIS HCT) 80-12.5 MG per  tablet Take 1 tablet by mouth daily.      Marland Kitchen tiotropium (SPIRIVA) 18 MCG inhalation capsule Place 18 mcg into inhaler and inhale daily.      Marland Kitchen esomeprazole (NEXIUM) 40 MG capsule Take 40 mg by mouth daily before breakfast. Reported on 05/31/2015     No current facility-administered medications on file prior to visit.      Review of Systems Constitutional:   No  weight loss, night sweats,  Fevers, chills, fatigue, or  lassitude.  HEENT:   No headaches,  Difficulty swallowing,  Tooth/dental problems, or  Sore throat,                No sneezing, itching, ear ache, nasal congestion, post nasal drip,   CV:  No chest pain,  Orthopnea, PND, swelling in lower extremities, anasarca, dizziness, palpitations, syncope.   GI  No heartburn, indigestion, abdominal pain, nausea, vomiting, diarrhea, change in bowel habits, loss of appetite, bloody stools.   Resp:   No excess mucus, no productive cough,  No non-productive cough,  No coughing up of blood.  No change in color of mucus.  No wheezing.  No chest wall deformity  Skin: no rash or lesions.  GU: no dysuria, change in color  of urine, no urgency or frequency.  No flank pain, no hematuria   MS:  No joint pain or swelling.  No decreased range of motion.  No back pain.  Psych:  No change in mood or affect. No depression or anxiety.  No memory loss.         Objective:   Physical Exam  Filed Vitals:   05/31/15 1135  BP: 128/62  Pulse: 60  Temp: 97.8 F (36.6 C)  TempSrc: Oral  Height: 6\' 1"  (1.854 m)  Weight: 233 lb (105.688 kg)  SpO2: 95%   GEN: A/Ox3; pleasant , NAD, elderly on O2   HEENT:  [place]/AT,  EACs-clear, TMs-wnl, NOSE-clear, THROAT-clear, no lesions, no postnasal drip or exudate noted.   NECK:  Supple w/ fair ROM; no JVD; normal carotid impulses w/o bruits; no thyromegaly or nodules palpated; no lymphadenopathy.  RESP  Decreased BS in bases no accessory muscle use, no dullness to percussion  CARD:  RRR, no m/r/g  , no  peripheral edema, pulses intact, no cyanosis or clubbing.  GI:   Soft & nt; nml bowel sounds; no organomegaly or masses detected.  Musco: Warm bil, no deformities or joint swelling noted.   Neuro: alert, no focal deficits noted.    Skin: Warm, no lesions or rashes  Aston Lieske NP-C  [facility]  05/31/15       Assessment & Plan:

## 2015-05-31 NOTE — Assessment & Plan Note (Signed)
Controlled on O2  

## 2015-05-31 NOTE — Patient Instructions (Addendum)
Continue Adavir /spiriva  Continue on Oxygen .  Follow up Dr. Elsworth Soho  In 6 months and As needed

## 2015-05-31 NOTE — Assessment & Plan Note (Signed)
Stable on current regimen  Plan  Continue Adavir /spiriva  Continue on Oxygen .  Follow up Dr. Elsworth Soho  In 6 months and As needed

## 2015-06-08 DIAGNOSIS — I10 Essential (primary) hypertension: Secondary | ICD-10-CM | POA: Diagnosis not present

## 2015-06-08 DIAGNOSIS — E1121 Type 2 diabetes mellitus with diabetic nephropathy: Secondary | ICD-10-CM | POA: Diagnosis not present

## 2015-06-08 DIAGNOSIS — Z6830 Body mass index (BMI) 30.0-30.9, adult: Secondary | ICD-10-CM | POA: Diagnosis not present

## 2015-06-08 DIAGNOSIS — M545 Low back pain: Secondary | ICD-10-CM | POA: Diagnosis not present

## 2015-06-08 DIAGNOSIS — J440 Chronic obstructive pulmonary disease with acute lower respiratory infection: Secondary | ICD-10-CM | POA: Diagnosis not present

## 2015-06-08 NOTE — Progress Notes (Signed)
Reviewed & agree with plan  

## 2015-06-15 DIAGNOSIS — J449 Chronic obstructive pulmonary disease, unspecified: Secondary | ICD-10-CM | POA: Diagnosis not present

## 2015-06-15 DIAGNOSIS — R0902 Hypoxemia: Secondary | ICD-10-CM | POA: Diagnosis not present

## 2015-06-30 DIAGNOSIS — R0902 Hypoxemia: Secondary | ICD-10-CM | POA: Diagnosis not present

## 2015-06-30 DIAGNOSIS — J449 Chronic obstructive pulmonary disease, unspecified: Secondary | ICD-10-CM | POA: Diagnosis not present

## 2015-07-06 DIAGNOSIS — J449 Chronic obstructive pulmonary disease, unspecified: Secondary | ICD-10-CM | POA: Diagnosis not present

## 2015-07-15 DIAGNOSIS — R0902 Hypoxemia: Secondary | ICD-10-CM | POA: Diagnosis not present

## 2015-07-15 DIAGNOSIS — J449 Chronic obstructive pulmonary disease, unspecified: Secondary | ICD-10-CM | POA: Diagnosis not present

## 2015-08-13 DIAGNOSIS — C61 Malignant neoplasm of prostate: Secondary | ICD-10-CM | POA: Diagnosis not present

## 2015-08-15 DIAGNOSIS — R0902 Hypoxemia: Secondary | ICD-10-CM | POA: Diagnosis not present

## 2015-08-15 DIAGNOSIS — J449 Chronic obstructive pulmonary disease, unspecified: Secondary | ICD-10-CM | POA: Diagnosis not present

## 2015-09-07 DIAGNOSIS — E11319 Type 2 diabetes mellitus with unspecified diabetic retinopathy without macular edema: Secondary | ICD-10-CM | POA: Diagnosis not present

## 2015-09-10 DIAGNOSIS — I10 Essential (primary) hypertension: Secondary | ICD-10-CM | POA: Diagnosis not present

## 2015-09-10 DIAGNOSIS — Z6830 Body mass index (BMI) 30.0-30.9, adult: Secondary | ICD-10-CM | POA: Diagnosis not present

## 2015-09-10 DIAGNOSIS — E1121 Type 2 diabetes mellitus with diabetic nephropathy: Secondary | ICD-10-CM | POA: Diagnosis not present

## 2015-09-10 DIAGNOSIS — D508 Other iron deficiency anemias: Secondary | ICD-10-CM | POA: Diagnosis not present

## 2015-09-10 DIAGNOSIS — M545 Low back pain: Secondary | ICD-10-CM | POA: Diagnosis not present

## 2015-09-10 DIAGNOSIS — J440 Chronic obstructive pulmonary disease with acute lower respiratory infection: Secondary | ICD-10-CM | POA: Diagnosis not present

## 2015-09-13 DIAGNOSIS — Z1211 Encounter for screening for malignant neoplasm of colon: Secondary | ICD-10-CM | POA: Diagnosis not present

## 2015-09-14 DIAGNOSIS — R0902 Hypoxemia: Secondary | ICD-10-CM | POA: Diagnosis not present

## 2015-09-14 DIAGNOSIS — J449 Chronic obstructive pulmonary disease, unspecified: Secondary | ICD-10-CM | POA: Diagnosis not present

## 2015-10-15 DIAGNOSIS — J449 Chronic obstructive pulmonary disease, unspecified: Secondary | ICD-10-CM | POA: Diagnosis not present

## 2015-10-15 DIAGNOSIS — R0902 Hypoxemia: Secondary | ICD-10-CM | POA: Diagnosis not present

## 2015-11-15 DIAGNOSIS — R0902 Hypoxemia: Secondary | ICD-10-CM | POA: Diagnosis not present

## 2015-11-15 DIAGNOSIS — J449 Chronic obstructive pulmonary disease, unspecified: Secondary | ICD-10-CM | POA: Diagnosis not present

## 2015-11-16 DIAGNOSIS — R0902 Hypoxemia: Secondary | ICD-10-CM | POA: Diagnosis not present

## 2015-11-16 DIAGNOSIS — J449 Chronic obstructive pulmonary disease, unspecified: Secondary | ICD-10-CM | POA: Diagnosis not present

## 2015-11-24 DIAGNOSIS — C61 Malignant neoplasm of prostate: Secondary | ICD-10-CM | POA: Diagnosis not present

## 2015-11-29 ENCOUNTER — Ambulatory Visit (INDEPENDENT_AMBULATORY_CARE_PROVIDER_SITE_OTHER): Payer: Medicare Other | Admitting: Pulmonary Disease

## 2015-11-29 ENCOUNTER — Encounter: Payer: Self-pay | Admitting: Pulmonary Disease

## 2015-11-29 DIAGNOSIS — J449 Chronic obstructive pulmonary disease, unspecified: Secondary | ICD-10-CM

## 2015-11-29 DIAGNOSIS — Z23 Encounter for immunization: Secondary | ICD-10-CM | POA: Diagnosis not present

## 2015-11-29 DIAGNOSIS — J9611 Chronic respiratory failure with hypoxia: Secondary | ICD-10-CM | POA: Diagnosis not present

## 2015-11-29 NOTE — Assessment & Plan Note (Signed)
We discussed portable concentrator and continuous O2 dozing I would like to check an ambulatory saturation on pulse 4 L-he did not want to do this today. We'll keep him on pulse dosing for now

## 2015-11-29 NOTE — Assessment & Plan Note (Signed)
Flu shot today Stay on Advair and Spiriva Call us for any new issues

## 2015-11-29 NOTE — Patient Instructions (Signed)
Flu shot today Stay on Advair and Spiriva Call us for any new issues

## 2015-11-29 NOTE — Progress Notes (Signed)
   Subjective:    Patient ID: Danny Martinez, male    DOB: 08/13/1933, 82 y.o.   MRN: [id number]  HPI  82/M, remote smoker for FU of severe COPD and predominant bullous emphysema & pulmonary hypertension   11/29/2015  Chief Complaint  Patient presents with  . Follow-up    doing very well, no concerns.   He has remained stable for the last few months, no sick visits or hospitalization He likes his current regimen of Advair and Spiriva and would not like to change-he gets this from the [place] at [place] cost. He has questions about his peripheral concentrator-he is using 4 L pulse and he wonders whether he needs continuous O2  Significant tests/ events  11/2006 FEV1 of 24% , with a significant bronchodilator response.  08/2007 Echo> severe pulmonary hypertension with PA pressure of 62 mmHg out of proportion to degree of MR, but normal RV function.  01/2009 RVSP 43, but RV dilated       Review of Systems Patient denies significant dyspnea,cough, hemoptysis,  chest pain, palpitations, pedal edema, orthopnea, paroxysmal nocturnal dyspnea, lightheadedness, nausea, vomiting, abdominal or  leg pains      Objective:   Physical Exam  Gen. Pleasant, well-nourished, in no distress ENT - no lesions, no post nasal drip Neck: No JVD, no thyromegaly, no carotid bruits Lungs: no use of accessory muscles, no dullness to percussion, Decreased without rales or rhonchi  Cardiovascular: Rhythm regular, heart sounds  normal, no murmurs or gallops, no peripheral edema Musculoskeletal: No deformities, no cyanosis or clubbing        Assessment & Plan:

## 2015-12-10 DIAGNOSIS — Z6830 Body mass index (BMI) 30.0-30.9, adult: Secondary | ICD-10-CM | POA: Diagnosis not present

## 2015-12-10 DIAGNOSIS — J440 Chronic obstructive pulmonary disease with acute lower respiratory infection: Secondary | ICD-10-CM | POA: Diagnosis not present

## 2015-12-10 DIAGNOSIS — I10 Essential (primary) hypertension: Secondary | ICD-10-CM | POA: Diagnosis not present

## 2015-12-10 DIAGNOSIS — E1121 Type 2 diabetes mellitus with diabetic nephropathy: Secondary | ICD-10-CM | POA: Diagnosis not present

## 2015-12-10 DIAGNOSIS — M545 Low back pain: Secondary | ICD-10-CM | POA: Diagnosis not present

## 2015-12-15 DIAGNOSIS — J449 Chronic obstructive pulmonary disease, unspecified: Secondary | ICD-10-CM | POA: Diagnosis not present

## 2015-12-15 DIAGNOSIS — R0902 Hypoxemia: Secondary | ICD-10-CM | POA: Diagnosis not present

## 2016-01-15 DIAGNOSIS — R0902 Hypoxemia: Secondary | ICD-10-CM | POA: Diagnosis not present

## 2016-01-15 DIAGNOSIS — J449 Chronic obstructive pulmonary disease, unspecified: Secondary | ICD-10-CM | POA: Diagnosis not present

## 2016-01-17 DIAGNOSIS — R0902 Hypoxemia: Secondary | ICD-10-CM | POA: Diagnosis not present

## 2016-01-17 DIAGNOSIS — J449 Chronic obstructive pulmonary disease, unspecified: Secondary | ICD-10-CM | POA: Diagnosis not present

## 2016-02-14 DIAGNOSIS — R0902 Hypoxemia: Secondary | ICD-10-CM | POA: Diagnosis not present

## 2016-02-14 DIAGNOSIS — J449 Chronic obstructive pulmonary disease, unspecified: Secondary | ICD-10-CM | POA: Diagnosis not present

## 2016-02-25 DIAGNOSIS — C61 Malignant neoplasm of prostate: Secondary | ICD-10-CM | POA: Diagnosis not present

## 2016-03-16 DIAGNOSIS — K210 Gastro-esophageal reflux disease with esophagitis: Secondary | ICD-10-CM | POA: Diagnosis not present

## 2016-03-16 DIAGNOSIS — Z0000 Encounter for general adult medical examination without abnormal findings: Secondary | ICD-10-CM | POA: Diagnosis not present

## 2016-03-16 DIAGNOSIS — Z1389 Encounter for screening for other disorder: Secondary | ICD-10-CM | POA: Diagnosis not present

## 2016-03-16 DIAGNOSIS — I10 Essential (primary) hypertension: Secondary | ICD-10-CM | POA: Diagnosis not present

## 2016-03-16 DIAGNOSIS — J440 Chronic obstructive pulmonary disease with acute lower respiratory infection: Secondary | ICD-10-CM | POA: Diagnosis not present

## 2016-03-16 DIAGNOSIS — J449 Chronic obstructive pulmonary disease, unspecified: Secondary | ICD-10-CM | POA: Diagnosis not present

## 2016-03-16 DIAGNOSIS — Z6830 Body mass index (BMI) 30.0-30.9, adult: Secondary | ICD-10-CM | POA: Diagnosis not present

## 2016-03-16 DIAGNOSIS — M545 Low back pain: Secondary | ICD-10-CM | POA: Diagnosis not present

## 2016-03-16 DIAGNOSIS — E1121 Type 2 diabetes mellitus with diabetic nephropathy: Secondary | ICD-10-CM | POA: Diagnosis not present

## 2016-03-16 DIAGNOSIS — R0902 Hypoxemia: Secondary | ICD-10-CM | POA: Diagnosis not present

## 2016-04-16 DIAGNOSIS — J449 Chronic obstructive pulmonary disease, unspecified: Secondary | ICD-10-CM | POA: Diagnosis not present

## 2016-04-16 DIAGNOSIS — R0902 Hypoxemia: Secondary | ICD-10-CM | POA: Diagnosis not present

## 2016-05-14 DIAGNOSIS — J449 Chronic obstructive pulmonary disease, unspecified: Secondary | ICD-10-CM | POA: Diagnosis not present

## 2016-05-14 DIAGNOSIS — R0902 Hypoxemia: Secondary | ICD-10-CM | POA: Diagnosis not present

## 2016-05-24 DIAGNOSIS — C61 Malignant neoplasm of prostate: Secondary | ICD-10-CM | POA: Diagnosis not present

## 2016-05-29 ENCOUNTER — Encounter: Payer: Self-pay | Admitting: Adult Health

## 2016-05-29 ENCOUNTER — Ambulatory Visit (INDEPENDENT_AMBULATORY_CARE_PROVIDER_SITE_OTHER): Payer: Medicare Other | Admitting: Adult Health

## 2016-05-29 DIAGNOSIS — J9611 Chronic respiratory failure with hypoxia: Secondary | ICD-10-CM

## 2016-05-29 DIAGNOSIS — J449 Chronic obstructive pulmonary disease, unspecified: Secondary | ICD-10-CM | POA: Diagnosis not present

## 2016-05-29 NOTE — Patient Instructions (Signed)
Continue Adavir  1 puff Twice daily   Continue on Spiriva daily  Continue on Oxygen 3-4 l/m .  Follow up Dr. Elsworth Soho  In 6 months and As needed

## 2016-05-29 NOTE — Assessment & Plan Note (Signed)
Cont on O2   Plan  . Patient Instructions  Continue Adavir  1 puff Twice daily   Continue on Spiriva daily  Continue on Oxygen 3-4 l/m .  Follow up Dr. Elsworth Soho  In 6 months and As needed

## 2016-05-29 NOTE — Assessment & Plan Note (Signed)
Doing well on current regimen , no recent flare   Plan  Patient Instructions  Continue Adavir  1 puff Twice daily   Continue on Spiriva daily  Continue on Oxygen 3-4 l/m .  Follow up Dr. Elsworth Soho  In 6 months and As needed

## 2016-05-29 NOTE — Progress Notes (Signed)
@Patient  ID: Danny Martinez, male    DOB: [birth year], 83 y.o.   MRN: [id number]  Chief Complaint  Patient presents with  . Follow-up    COPD    Referring provider: Neale Burly, MD  HPI: 82/M, remote smoker for FU of severe COPD and predominant bullous emphysema &pulmonary hypertension   Significant tests/ events 11/2006 FEV1 of 24% , with a significant bronchodilator response.  08/2007 Echo>severe pulmonary hypertension with PA pressure of 62 mmHg out of proportion to degree of MR, but normal RV function.  01/2009 RVSP 43, but RV dilated   05/29/2016 Follow up : COPD /O2 RF /Pulmonary HTN  Patient returns for a six-month follow-up. Patient remains on Advair and Spiriva. He says he's done well over the winter months without any flare cough or wheezing. He did not require any antibiotics or prednisone. He continues on oxygen with 3-4 L. He does have a portable concentrator that uses pulsed oxygen at 4 L when he is out from his home. He did want to discuss alternative options. We discussed simply go concentrator.. Chest x-ray in 2015 show COPD changes. We discussed a repeat chest x-ray today. Patient declines. Pneumovax, flu  and Prevnar vaccines are up-to-date.    Allergies  Allergen Reactions  . Neomycin-Bacitracin Zn-Polymyx     REACTION: rash    Immunization History  Administered Date(s) Administered  . Influenza Split 12/12/2010, 01/11/2013, 01/11/2014  . Influenza Whole 12/12/2011  . Influenza,inj,Quad PF,36+ Mos 12/01/2014, 11/29/2015  . Pneumococcal Conjugate-13 07/11/2013  . Pneumococcal Polysaccharide-23 03/13/2006  . Tdap 02/09/2014  . Zoster 01/12/2011    Past Medical History:  Diagnosis Date  . COPD (chronic obstructive pulmonary disease) ([place])   . Emphysema   . History of prostate cancer   . Hypoxemia   . Pulmonary hypertension   . PVC (premature ventricular contraction)     Tobacco History: History  Smoking Status  . Former Smoker  .  Packs/day: 1.00  . Years: 20.00  . Types: Cigarettes  . Quit date: 03/13/1982  Smokeless Tobacco  . Never Used   Counseling given: Not Answered   Outpatient Encounter Prescriptions as of 05/29/2016  Medication Sig  . albuterol (PROVENTIL) (2.5 MG/3ML) 0.083% nebulizer solution Take 2.5 mg by nebulization every 4 (four) hours as needed.    Marland Kitchen aspirin 81 MG tablet Take 81 mg by mouth daily.    Marland Kitchen atenolol (TENORMIN) 50 MG tablet Take 50 mg by mouth daily.    Marland Kitchen atorvastatin (LIPITOR) 20 MG tablet Take 20 mg by mouth at bedtime.  . Cholecalciferol (VITAMIN D-3) 5000 UNITS TABS Take 1 tablet by mouth daily.    . Fluticasone-Salmeterol (ADVAIR DISKUS) 250-50 MCG/DOSE AEPB Inhale 1 puff into the lungs every 12 (twelve) hours.    Marland Kitchen glimepiride (AMARYL) 1 MG tablet Take 1 mg by mouth daily before breakfast.  . omeprazole (PRILOSEC) 20 MG capsule Take 40 mg by mouth daily.   . OXYGEN Inhale 3.5 L into the lungs.  Marland Kitchen telmisartan-hydrochlorothiazide (MICARDIS HCT) 80-12.5 MG per tablet Take 1 tablet by mouth daily.    Marland Kitchen tiotropium (SPIRIVA) 18 MCG inhalation capsule Place 18 mcg into inhaler and inhale daily.    . [DISCONTINUED] esomeprazole (NEXIUM) 40 MG capsule Take 40 mg by mouth daily before breakfast. Reported on 05/31/2015   No facility-administered encounter medications on file as of 05/29/2016.      Review of Systems  Constitutional:   No  weight loss, night sweats,  Fevers, chills, fatigue,  or  lassitude.  HEENT:   No headaches,  Difficulty swallowing,  Tooth/dental problems, or  Sore throat,                No sneezing, itching, ear ache, nasal congestion, post nasal drip,   CV:  No chest pain,  Orthopnea, PND, swelling in lower extremities, anasarca, dizziness, palpitations, syncope.   GI  No heartburn, indigestion, abdominal pain, nausea, vomiting, diarrhea, change in bowel habits, loss of appetite, bloody stools.   Resp:   No coughing up of blood.  No change in color of mucus.  No  wheezing.  No chest wall deformity  Skin: no rash or lesions.  GU: no dysuria, change in color of urine, no urgency or frequency.  No flank pain, no hematuria   MS:  No joint pain or swelling.  No decreased range of motion.  No back pain.    Physical Exam  BP 112/66 (BP Location: Left Arm, Cuff Size: Normal)   Pulse 63   Ht 6' 1.5" (1.867 m)   Wt 235 lb 6.4 oz (106.8 kg)   SpO2 92%   BMI 30.64 kg/m   GEN: A/Ox3; pleasant , NAD, obese , elderly, on O2    HEENT:  [place]/AT,  EACs-clear, TMs-wnl, NOSE-clear, THROAT-clear, no lesions, no postnasal drip or exudate noted.   NECK:  Supple w/ fair ROM; no JVD; normal carotid impulses w/o bruits; no thyromegaly or nodules palpated; no lymphadenopathy.    RESP  Decreased BS in bases , . no accessory muscle use, no dullness to percussion  CARD:  RRR, no m/r/g, no peripheral edema, pulses intact, no cyanosis or clubbing.  GI:   Soft & nt; nml bowel sounds; no organomegaly or masses detected.   Musco: Warm bil, no deformities or joint swelling noted.   Neuro: alert, no focal deficits noted.    Skin: Warm, no lesions or rashes    Lab Results:    Imaging: No results found.   Assessment & Plan:   COPD (chronic obstructive pulmonary disease) Doing well on current regimen , no recent flare   Plan  Patient Instructions  Continue Adavir  1 puff Twice daily   Continue on Spiriva daily  Continue on Oxygen 3-4 l/m .  Follow up Dr. Elsworth Soho  In 6 months and As needed      Chronic respiratory failure Cont on O2   Plan  . Patient Instructions  Continue Adavir  1 puff Twice daily   Continue on Spiriva daily  Continue on Oxygen 3-4 l/m .  Follow up Dr. Elsworth Soho  In 6 months and As needed         Rexene Edison, NP 05/29/2016

## 2016-05-30 NOTE — Progress Notes (Signed)
Reviewed & agree with plan  

## 2016-06-07 ENCOUNTER — Ambulatory Visit (INDEPENDENT_AMBULATORY_CARE_PROVIDER_SITE_OTHER): Payer: Medicare Other | Admitting: Urology

## 2016-06-07 DIAGNOSIS — C61 Malignant neoplasm of prostate: Secondary | ICD-10-CM

## 2016-06-14 DIAGNOSIS — R0902 Hypoxemia: Secondary | ICD-10-CM | POA: Diagnosis not present

## 2016-06-14 DIAGNOSIS — J449 Chronic obstructive pulmonary disease, unspecified: Secondary | ICD-10-CM | POA: Diagnosis not present

## 2016-06-15 DIAGNOSIS — E1121 Type 2 diabetes mellitus with diabetic nephropathy: Secondary | ICD-10-CM | POA: Diagnosis not present

## 2016-06-15 DIAGNOSIS — K210 Gastro-esophageal reflux disease with esophagitis: Secondary | ICD-10-CM | POA: Diagnosis not present

## 2016-06-15 DIAGNOSIS — J440 Chronic obstructive pulmonary disease with acute lower respiratory infection: Secondary | ICD-10-CM | POA: Diagnosis not present

## 2016-06-15 DIAGNOSIS — I10 Essential (primary) hypertension: Secondary | ICD-10-CM | POA: Diagnosis not present

## 2016-06-29 DIAGNOSIS — J449 Chronic obstructive pulmonary disease, unspecified: Secondary | ICD-10-CM | POA: Diagnosis not present

## 2016-07-14 DIAGNOSIS — R0902 Hypoxemia: Secondary | ICD-10-CM | POA: Diagnosis not present

## 2016-07-14 DIAGNOSIS — J449 Chronic obstructive pulmonary disease, unspecified: Secondary | ICD-10-CM | POA: Diagnosis not present

## 2016-08-14 DIAGNOSIS — J449 Chronic obstructive pulmonary disease, unspecified: Secondary | ICD-10-CM | POA: Diagnosis not present

## 2016-08-14 DIAGNOSIS — R0902 Hypoxemia: Secondary | ICD-10-CM | POA: Diagnosis not present

## 2016-08-29 DIAGNOSIS — C61 Malignant neoplasm of prostate: Secondary | ICD-10-CM | POA: Diagnosis not present

## 2016-09-06 ENCOUNTER — Ambulatory Visit (INDEPENDENT_AMBULATORY_CARE_PROVIDER_SITE_OTHER): Payer: Medicare Other | Admitting: Urology

## 2016-09-06 DIAGNOSIS — C61 Malignant neoplasm of prostate: Secondary | ICD-10-CM | POA: Diagnosis not present

## 2016-09-13 DIAGNOSIS — R0902 Hypoxemia: Secondary | ICD-10-CM | POA: Diagnosis not present

## 2016-09-13 DIAGNOSIS — J449 Chronic obstructive pulmonary disease, unspecified: Secondary | ICD-10-CM | POA: Diagnosis not present

## 2016-09-25 DIAGNOSIS — I10 Essential (primary) hypertension: Secondary | ICD-10-CM | POA: Diagnosis not present

## 2016-09-25 DIAGNOSIS — J440 Chronic obstructive pulmonary disease with acute lower respiratory infection: Secondary | ICD-10-CM | POA: Diagnosis not present

## 2016-09-25 DIAGNOSIS — K210 Gastro-esophageal reflux disease with esophagitis: Secondary | ICD-10-CM | POA: Diagnosis not present

## 2016-09-25 DIAGNOSIS — E1121 Type 2 diabetes mellitus with diabetic nephropathy: Secondary | ICD-10-CM | POA: Diagnosis not present

## 2016-10-14 DIAGNOSIS — R0902 Hypoxemia: Secondary | ICD-10-CM | POA: Diagnosis not present

## 2016-10-14 DIAGNOSIS — J449 Chronic obstructive pulmonary disease, unspecified: Secondary | ICD-10-CM | POA: Diagnosis not present

## 2016-10-16 DIAGNOSIS — H35033 Hypertensive retinopathy, bilateral: Secondary | ICD-10-CM | POA: Diagnosis not present

## 2016-11-14 DIAGNOSIS — J449 Chronic obstructive pulmonary disease, unspecified: Secondary | ICD-10-CM | POA: Diagnosis not present

## 2016-11-14 DIAGNOSIS — R0902 Hypoxemia: Secondary | ICD-10-CM | POA: Diagnosis not present

## 2016-11-27 ENCOUNTER — Ambulatory Visit: Payer: Medicare Other | Admitting: Pulmonary Disease

## 2016-11-28 DIAGNOSIS — C61 Malignant neoplasm of prostate: Secondary | ICD-10-CM | POA: Diagnosis not present

## 2016-12-04 ENCOUNTER — Ambulatory Visit (INDEPENDENT_AMBULATORY_CARE_PROVIDER_SITE_OTHER)
Admission: RE | Admit: 2016-12-04 | Discharge: 2016-12-04 | Disposition: A | Discharge status: Home / Self Care | Payer: Medicare Other | Source: Ambulatory Visit | Attending: Adult Health | Admitting: Adult Health

## 2016-12-04 ENCOUNTER — Encounter: Payer: Self-pay | Admitting: Adult Health

## 2016-12-04 ENCOUNTER — Ambulatory Visit (INDEPENDENT_AMBULATORY_CARE_PROVIDER_SITE_OTHER): Payer: Medicare Other | Admitting: Adult Health

## 2016-12-04 DIAGNOSIS — J449 Chronic obstructive pulmonary disease, unspecified: Secondary | ICD-10-CM | POA: Diagnosis not present

## 2016-12-04 DIAGNOSIS — J9611 Chronic respiratory failure with hypoxia: Secondary | ICD-10-CM | POA: Diagnosis not present

## 2016-12-04 DIAGNOSIS — Z23 Encounter for immunization: Secondary | ICD-10-CM

## 2016-12-04 NOTE — Progress Notes (Signed)
@Patient  ID: Danny Martinez, male    DOB: 03-Nov-1933, 83 y.o.   MRN: [id number]  Chief Complaint  Patient presents with  . Follow-up    COPD     Referring provider: Neale Burly, MD  HPI: 83/M, remote smoker for FU of severe COPD and predominant bullous emphysema &pulmonary hypertension   Significant tests/ events 11/2006 FEV1 of 24% , with a significant bronchodilator response.  08/2007 Echo>severe pulmonary hypertension with PA pressure of 62 mmHg out of proportion to degree of MR, but normal RV function.  01/2009 RVSP 43, but RV dilated   12/04/2016 Follow up ; COPD, O2 RF and Pulmonary HTN  Patient returns for six-month follow-up. Patient has COPD and remains on Advair and Spiriva. Says overall his breathing is doing about the same. No flare of cough or wheezing .  Walks on flat surface. Drives and is independent . Not able to do yard work.  Has some drainage in throat .   Has chronic respiratory failure on oxygen 3-4 L. No increased doe.   Pneumovax and Prevnar up-to-date. Patient requests flu shot today..  Allergies  Allergen Reactions  . Neomycin-Bacitracin Zn-Polymyx     REACTION: rash    Immunization History  Administered Date(s) Administered  . Influenza Split 12/12/2010, 01/11/2013, 01/11/2014  . Influenza Whole 12/12/2011  . Influenza,inj,Quad PF,6+ Mos 12/01/2014, 11/29/2015  . Pneumococcal Conjugate-13 07/11/2013  . Pneumococcal Polysaccharide-23 03/13/2006  . Tdap 02/09/2014  . Zoster 01/12/2011    Past Medical History:  Diagnosis Date  . COPD (chronic obstructive pulmonary disease) ([place])   . Emphysema   . History of prostate cancer   . Hypoxemia   . Pulmonary hypertension ([place])   . PVC (premature ventricular contraction)     Tobacco History: History  Smoking Status  . Former Smoker  . Packs/day: 1.00  . Years: 20.00  . Types: Cigarettes  . Quit date: 03/13/1982  Smokeless Tobacco  . Never Used   Counseling given: Not  Answered   Outpatient Encounter Prescriptions as of 12/04/2016  Medication Sig  . albuterol (PROVENTIL) (2.5 MG/3ML) 0.083% nebulizer solution Take 2.5 mg by nebulization every 4 (four) hours as needed.    Marland Kitchen aspirin 81 MG tablet Take 81 mg by mouth daily.    Marland Kitchen atenolol (TENORMIN) 50 MG tablet Take 50 mg by mouth daily.    Marland Kitchen atorvastatin (LIPITOR) 20 MG tablet Take 20 mg by mouth at bedtime.  . Cholecalciferol (VITAMIN D-3) 5000 UNITS TABS Take 1 tablet by mouth daily.    . Fluticasone-Salmeterol (ADVAIR DISKUS) 250-50 MCG/DOSE AEPB Inhale 1 puff into the lungs every 12 (twelve) hours.    Marland Kitchen glimepiride (AMARYL) 1 MG tablet Take 1 mg by mouth daily before breakfast.  . omeprazole (PRILOSEC) 20 MG capsule Take 40 mg by mouth daily.   . OXYGEN Inhale 3.5 L into the lungs.  Marland Kitchen telmisartan-hydrochlorothiazide (MICARDIS HCT) 80-12.5 MG per tablet Take 1 tablet by mouth daily.    Marland Kitchen tiotropium (SPIRIVA) 18 MCG inhalation capsule Place 18 mcg into inhaler and inhale daily.     No facility-administered encounter medications on file as of 12/04/2016.      Review of Systems  Constitutional:   No  weight loss, night sweats,  Fevers, chills, fatigue, or  lassitude.  HEENT:   No headaches,  Difficulty swallowing,  Tooth/dental problems, or  Sore throat,                No sneezing, itching, ear ache,  +  nasal congestion, post nasal drip,   CV:  No chest pain,  Orthopnea, PND, swelling in lower extremities, anasarca, dizziness, palpitations, syncope.   GI  No heartburn, indigestion, abdominal pain, nausea, vomiting, diarrhea, change in bowel habits, loss of appetite, bloody stools.   Resp:   No chest wall deformity  Skin: no rash or lesions.  GU: no dysuria, change in color of urine, no urgency or frequency.  No flank pain, no hematuria   MS:  No joint pain or swelling.  No decreased range of motion.  No back pain.    Physical Exam  BP 114/64 (BP Location: Left Arm, Cuff Size: Normal)   Pulse  (!) 57   Ht 6' 1.5" (1.867 m)   Wt 229 lb (103.9 kg)   SpO2 97%   BMI 29.80 kg/m   GEN: A/Ox3; pleasant , NAD, elderly    HEENT:  [place]/AT,  EACs-clear, TMs-wnl, NOSE-clear, THROAT-clear, no lesions, no postnasal drip or exudate noted.   NECK:  Supple w/ fair ROM; no JVD; normal carotid impulses w/o bruits; no thyromegaly or nodules palpated; no lymphadenopathy.    RESP  Decreased BS in bases , no accessory muscle use, no dullness to percussion  CARD:  RRR, no m/r/g, no peripheral edema, pulses intact, no cyanosis or clubbing.  GI:   Soft & nt; nml bowel sounds; no organomegaly or masses detected.   Musco: Warm bil, no deformities or joint swelling noted.   Neuro: alert, no focal deficits noted.    Skin: Warm, no lesions or rashes    Lab Results:  CBC  BMET  BNP No results found for: BNP   Imaging: No results found.   Assessment & Plan:   COPD (chronic obstructive pulmonary disease) Compensated on present regimen   Plan  Patient Instructions  Continue Adavir  1 puff Twice daily   Continue on Spiriva daily  Continue on Oxygen 3-4 l/m .  Flu shot today  Chest xray today .  Saline nasal rinses As needed   Claritin 10mg  At bedtime As needed  Drainage.  Follow up Dr. Elsworth Soho  In 6 months and As needed      Chronic respiratory failure Doing well on O2   Plan  Patient Instructions  Continue Adavir  1 puff Twice daily   Continue on Spiriva daily  Continue on Oxygen 3-4 l/m .  Flu shot today  Chest xray today .  Saline nasal rinses As needed   Claritin 10mg  At bedtime As needed  Drainage.  Follow up Dr. Elsworth Soho  In 6 months and As needed          Rexene Edison, NP 12/04/2016

## 2016-12-04 NOTE — Addendum Note (Signed)
Addended by: Parke Poisson E on: 12/04/2016 03:15 PM   Modules accepted: Orders

## 2016-12-04 NOTE — Patient Instructions (Addendum)
Continue Adavir  1 puff Twice daily   Continue on Spiriva daily  Continue on Oxygen 3-4 l/m .  Flu shot today  Chest xray today .  Saline nasal rinses As needed   Claritin 10mg  At bedtime As needed  Drainage.  Follow up Dr. Elsworth Soho  In 6 months and As needed

## 2016-12-04 NOTE — Assessment & Plan Note (Signed)
Doing well on O2   Plan  Patient Instructions  Continue Adavir  1 puff Twice daily   Continue on Spiriva daily  Continue on Oxygen 3-4 l/m .  Flu shot today  Chest xray today .  Saline nasal rinses As needed   Claritin 10mg  At bedtime As needed  Drainage.  Follow up Dr. Elsworth Soho  In 6 months and As needed

## 2016-12-04 NOTE — Assessment & Plan Note (Signed)
Compensated on present regimen   Plan  Patient Instructions  Continue Adavir  1 puff Twice daily   Continue on Spiriva daily  Continue on Oxygen 3-4 l/m .  Flu shot today  Chest xray today .  Saline nasal rinses As needed   Claritin 10mg  At bedtime As needed  Drainage.  Follow up Dr. Elsworth Soho  In 6 months and As needed

## 2016-12-05 ENCOUNTER — Telehealth: Payer: Self-pay | Admitting: Pulmonary Disease

## 2016-12-05 NOTE — Telephone Encounter (Signed)
Notes recorded by Melvenia Needles, NP on 12/04/2016 at 4:26 PM EDT COPD changes  Cont w/ ov recs  Spoke with pt, advised results. Pt understood. Nothing further is needed

## 2016-12-06 ENCOUNTER — Ambulatory Visit (INDEPENDENT_AMBULATORY_CARE_PROVIDER_SITE_OTHER): Payer: Medicare Other | Admitting: Urology

## 2016-12-06 DIAGNOSIS — R351 Nocturia: Secondary | ICD-10-CM | POA: Diagnosis not present

## 2016-12-06 DIAGNOSIS — C61 Malignant neoplasm of prostate: Secondary | ICD-10-CM

## 2016-12-14 DIAGNOSIS — R0902 Hypoxemia: Secondary | ICD-10-CM | POA: Diagnosis not present

## 2016-12-14 DIAGNOSIS — J449 Chronic obstructive pulmonary disease, unspecified: Secondary | ICD-10-CM | POA: Diagnosis not present

## 2016-12-26 DIAGNOSIS — I10 Essential (primary) hypertension: Secondary | ICD-10-CM | POA: Diagnosis not present

## 2016-12-26 DIAGNOSIS — J440 Chronic obstructive pulmonary disease with acute lower respiratory infection: Secondary | ICD-10-CM | POA: Diagnosis not present

## 2016-12-26 DIAGNOSIS — E1121 Type 2 diabetes mellitus with diabetic nephropathy: Secondary | ICD-10-CM | POA: Diagnosis not present

## 2016-12-26 DIAGNOSIS — K210 Gastro-esophageal reflux disease with esophagitis: Secondary | ICD-10-CM | POA: Diagnosis not present

## 2017-01-14 DIAGNOSIS — J449 Chronic obstructive pulmonary disease, unspecified: Secondary | ICD-10-CM | POA: Diagnosis not present

## 2017-01-14 DIAGNOSIS — R0902 Hypoxemia: Secondary | ICD-10-CM | POA: Diagnosis not present

## 2017-02-13 DIAGNOSIS — R0902 Hypoxemia: Secondary | ICD-10-CM | POA: Diagnosis not present

## 2017-02-13 DIAGNOSIS — J449 Chronic obstructive pulmonary disease, unspecified: Secondary | ICD-10-CM | POA: Diagnosis not present

## 2017-02-26 DIAGNOSIS — C61 Malignant neoplasm of prostate: Secondary | ICD-10-CM | POA: Diagnosis not present

## 2017-03-07 ENCOUNTER — Ambulatory Visit: Payer: Medicare Other | Admitting: Urology

## 2017-03-07 DIAGNOSIS — R351 Nocturia: Secondary | ICD-10-CM | POA: Diagnosis not present

## 2017-03-07 DIAGNOSIS — C61 Malignant neoplasm of prostate: Secondary | ICD-10-CM

## 2017-03-16 DIAGNOSIS — R0902 Hypoxemia: Secondary | ICD-10-CM | POA: Diagnosis not present

## 2017-03-16 DIAGNOSIS — J449 Chronic obstructive pulmonary disease, unspecified: Secondary | ICD-10-CM | POA: Diagnosis not present

## 2017-03-27 DIAGNOSIS — J440 Chronic obstructive pulmonary disease with acute lower respiratory infection: Secondary | ICD-10-CM | POA: Diagnosis not present

## 2017-03-27 DIAGNOSIS — J441 Chronic obstructive pulmonary disease with (acute) exacerbation: Secondary | ICD-10-CM | POA: Diagnosis not present

## 2017-03-27 DIAGNOSIS — Z6830 Body mass index (BMI) 30.0-30.9, adult: Secondary | ICD-10-CM | POA: Diagnosis not present

## 2017-03-27 DIAGNOSIS — K210 Gastro-esophageal reflux disease with esophagitis: Secondary | ICD-10-CM | POA: Diagnosis not present

## 2017-03-27 DIAGNOSIS — E1121 Type 2 diabetes mellitus with diabetic nephropathy: Secondary | ICD-10-CM | POA: Diagnosis not present

## 2017-03-27 DIAGNOSIS — H6123 Impacted cerumen, bilateral: Secondary | ICD-10-CM | POA: Diagnosis not present

## 2017-03-27 DIAGNOSIS — I10 Essential (primary) hypertension: Secondary | ICD-10-CM | POA: Diagnosis not present

## 2017-04-16 DIAGNOSIS — J449 Chronic obstructive pulmonary disease, unspecified: Secondary | ICD-10-CM | POA: Diagnosis not present

## 2017-04-16 DIAGNOSIS — R0902 Hypoxemia: Secondary | ICD-10-CM | POA: Diagnosis not present

## 2017-05-14 DIAGNOSIS — J449 Chronic obstructive pulmonary disease, unspecified: Secondary | ICD-10-CM | POA: Diagnosis not present

## 2017-05-14 DIAGNOSIS — R0902 Hypoxemia: Secondary | ICD-10-CM | POA: Diagnosis not present

## 2017-05-15 DIAGNOSIS — J449 Chronic obstructive pulmonary disease, unspecified: Secondary | ICD-10-CM | POA: Diagnosis not present

## 2017-05-28 DIAGNOSIS — C61 Malignant neoplasm of prostate: Secondary | ICD-10-CM | POA: Diagnosis not present

## 2017-06-06 ENCOUNTER — Ambulatory Visit: Payer: Medicare Other | Admitting: Urology

## 2017-06-06 DIAGNOSIS — C61 Malignant neoplasm of prostate: Secondary | ICD-10-CM | POA: Diagnosis not present

## 2017-06-06 DIAGNOSIS — R351 Nocturia: Secondary | ICD-10-CM | POA: Diagnosis not present

## 2017-06-14 DIAGNOSIS — R0902 Hypoxemia: Secondary | ICD-10-CM | POA: Diagnosis not present

## 2017-06-14 DIAGNOSIS — J449 Chronic obstructive pulmonary disease, unspecified: Secondary | ICD-10-CM | POA: Diagnosis not present

## 2017-06-28 DIAGNOSIS — E1121 Type 2 diabetes mellitus with diabetic nephropathy: Secondary | ICD-10-CM | POA: Diagnosis not present

## 2017-06-28 DIAGNOSIS — K210 Gastro-esophageal reflux disease with esophagitis: Secondary | ICD-10-CM | POA: Diagnosis not present

## 2017-06-28 DIAGNOSIS — I10 Essential (primary) hypertension: Secondary | ICD-10-CM | POA: Diagnosis not present

## 2017-06-28 DIAGNOSIS — J441 Chronic obstructive pulmonary disease with (acute) exacerbation: Secondary | ICD-10-CM | POA: Diagnosis not present

## 2017-07-14 DIAGNOSIS — J449 Chronic obstructive pulmonary disease, unspecified: Secondary | ICD-10-CM | POA: Diagnosis not present

## 2017-07-14 DIAGNOSIS — R0902 Hypoxemia: Secondary | ICD-10-CM | POA: Diagnosis not present

## 2017-07-17 ENCOUNTER — Encounter: Payer: Self-pay | Admitting: Pulmonary Disease

## 2017-07-17 ENCOUNTER — Ambulatory Visit: Payer: Medicare Other | Admitting: Pulmonary Disease

## 2017-07-17 DIAGNOSIS — J9611 Chronic respiratory failure with hypoxia: Secondary | ICD-10-CM

## 2017-07-17 DIAGNOSIS — J432 Centrilobular emphysema: Secondary | ICD-10-CM

## 2017-07-17 NOTE — Assessment & Plan Note (Signed)
Stay on Advair and Spiriva.  will qualify for pulmonary rehab he will call us back for referral

## 2017-07-17 NOTE — Progress Notes (Signed)
   Subjective:    Patient ID: Danny Martinez, male    DOB: Nov 26, 1933, 84 y.o.   MRN: [id number]  HPI  83/M, remote smoker for FU of severe COPD and predominant bullous emphysema &pulmonary hypertension   Has done well over the past several years that I have known him.  He admits that he has become more sedentary.  Does not want to do a lot of walk today but states that his on his own pulse oximeter oxygen level stays good, uses 3 L when walking. 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  PFts 2015 FEV1 0.74 - 25%, DLCO 26%, FVC 49 %   08/2007 Echo>severe pulmonary hypertension with PA pressure of 62 mmHg out of proportion to degree of MR, but normal RV function.  01/2009 RVSP 43, but RV dilated   Review of Systems Patient denies significant dyspnea,cough, hemoptysis,  chest pain, palpitations, pedal edema, orthopnea, paroxysmal nocturnal dyspnea, lightheadedness, nausea, vomiting, abdominal or  leg pains      Objective:   Physical Exam   Gen. Pleasant, well-nourished, in no distress ENT - no thrush, no post nasal drip Neck: No JVD, no thyromegaly, no carotid bruits Lungs: no use of accessory muscles, no dullness to percussion, clear without rales or rhonchi  Cardiovascular: Rhythm regular, heart sounds  normal, no murmurs or gallops, no peripheral edema Musculoskeletal: No deformities, no cyanosis or clubbing         Assessment & Plan:

## 2017-07-17 NOTE — Assessment & Plan Note (Signed)
Continue 2 L oxygen at rest and 3 L on exertion

## 2017-07-17 NOTE — Patient Instructions (Signed)
Stay on advair & spiriva

## 2017-07-19 DIAGNOSIS — Z6831 Body mass index (BMI) 31.0-31.9, adult: Secondary | ICD-10-CM | POA: Diagnosis not present

## 2017-07-19 DIAGNOSIS — L2389 Allergic contact dermatitis due to other agents: Secondary | ICD-10-CM | POA: Diagnosis not present

## 2017-08-14 DIAGNOSIS — J449 Chronic obstructive pulmonary disease, unspecified: Secondary | ICD-10-CM | POA: Diagnosis not present

## 2017-08-14 DIAGNOSIS — R0902 Hypoxemia: Secondary | ICD-10-CM | POA: Diagnosis not present

## 2017-09-05 DIAGNOSIS — C61 Malignant neoplasm of prostate: Secondary | ICD-10-CM | POA: Diagnosis not present

## 2017-09-12 ENCOUNTER — Ambulatory Visit: Payer: Medicare Other | Admitting: Urology

## 2017-09-12 DIAGNOSIS — C61 Malignant neoplasm of prostate: Secondary | ICD-10-CM | POA: Diagnosis not present

## 2017-09-12 DIAGNOSIS — R351 Nocturia: Secondary | ICD-10-CM

## 2017-09-13 DIAGNOSIS — J449 Chronic obstructive pulmonary disease, unspecified: Secondary | ICD-10-CM | POA: Diagnosis not present

## 2017-09-13 DIAGNOSIS — R0902 Hypoxemia: Secondary | ICD-10-CM | POA: Diagnosis not present

## 2017-10-03 DIAGNOSIS — J441 Chronic obstructive pulmonary disease with (acute) exacerbation: Secondary | ICD-10-CM | POA: Diagnosis not present

## 2017-10-03 DIAGNOSIS — E119 Type 2 diabetes mellitus without complications: Secondary | ICD-10-CM | POA: Diagnosis not present

## 2017-10-03 DIAGNOSIS — Z6831 Body mass index (BMI) 31.0-31.9, adult: Secondary | ICD-10-CM | POA: Diagnosis not present

## 2017-10-03 DIAGNOSIS — I10 Essential (primary) hypertension: Secondary | ICD-10-CM | POA: Diagnosis not present

## 2017-11-08 DIAGNOSIS — M85852 Other specified disorders of bone density and structure, left thigh: Secondary | ICD-10-CM | POA: Diagnosis not present

## 2017-12-31 DIAGNOSIS — Z23 Encounter for immunization: Secondary | ICD-10-CM | POA: Diagnosis not present

## 2018-01-10 DIAGNOSIS — Z0000 Encounter for general adult medical examination without abnormal findings: Secondary | ICD-10-CM | POA: Diagnosis not present

## 2018-01-10 DIAGNOSIS — Z6831 Body mass index (BMI) 31.0-31.9, adult: Secondary | ICD-10-CM | POA: Diagnosis not present

## 2018-01-18 ENCOUNTER — Encounter: Payer: Self-pay | Admitting: Adult Health

## 2018-01-18 ENCOUNTER — Ambulatory Visit: Payer: Medicare Other | Admitting: Adult Health

## 2018-01-18 DIAGNOSIS — J432 Centrilobular emphysema: Secondary | ICD-10-CM

## 2018-01-18 DIAGNOSIS — J9611 Chronic respiratory failure with hypoxia: Secondary | ICD-10-CM

## 2018-01-18 NOTE — Assessment & Plan Note (Signed)
Cont on O2 .  

## 2018-01-18 NOTE — Assessment & Plan Note (Addendum)
Compensated without flare Declines chest xray today , does not qualify for LDCT screening .   Plan  Patient Instructions  Continue Adavir  1 puff Twice daily  , rinse after use .  Continue on Spiriva daily  Continue on Oxygen.  Follow up Dr. Elsworth Soho or Dammon Makarewicz NP  In 6 months and As needed

## 2018-01-18 NOTE — Patient Instructions (Signed)
Continue Adavir  1 puff Twice daily  , rinse after use .  Continue on Spiriva daily  Continue on Oxygen.  Follow up Dr. Elsworth Soho or Kearston Putman NP  In 6 months and As needed

## 2018-01-18 NOTE — Progress Notes (Signed)
@Patient  ID: Danny Martinez, male    DOB: December 01, 1933, 84 y.o.   MRN: [id number]  Chief Complaint  Patient presents with  . Follow-up    COPD    Referring provider: Neale Burly, MD  HPI: 23/M, remote smoker for FU of severe COPD and predominant bullous emphysema &pulmonary hypertension    TEST/EVENTS :  11/2006 FEV1 of 24% , with a significant bronchodilator response.  08/2007 Echo>severe pulmonary hypertension with PA pressure of 62 mmHg out of proportion to degree of MR, but normal RV function.  01/2009 RVSP 43, but RV dilated   01/18/2018 Follow up : COPD , O2 RF , and Pulmonary HTN  Patient presents for a 29-month follow-up.  Patient has underlying very severe COPD.  He remains on Advair and Spiriva.  Says overall breathing is doing about the same.  He gets winded with heavy activity.  Tries to stay active.  He denies any increased cough or wheezing.  No increased albuterol use.  He remains on oxygen 3 L with rest and 4 L with activity.   Flu shot is up-to-date   Allergies  Allergen Reactions  . Neomycin-Bacitracin Zn-Polymyx     REACTION: rash    Immunization History  Administered Date(s) Administered  . Influenza Split 12/12/2010, 01/11/2013, 01/11/2014  . Influenza Whole 12/12/2011  . Influenza, High Dose Seasonal PF 12/04/2016, 12/31/2017  . Influenza,inj,Quad PF,6+ Mos 12/01/2014, 11/29/2015  . Pneumococcal Conjugate-13 07/11/2013  . Pneumococcal Polysaccharide-23 03/13/2006  . Tdap 02/09/2014  . Zoster 01/12/2011    Past Medical History:  Diagnosis Date  . COPD (chronic obstructive pulmonary disease) ([place])   . Emphysema   . History of prostate cancer   . Hypoxemia   . Pulmonary hypertension ([place])   . PVC (premature ventricular contraction)     Tobacco History: Social History   Tobacco Use  Smoking Status Former Smoker  . Packs/day: 1.00  . Years: 20.00  . Pack years: 20.00  . Types: Cigarettes  . Last attempt to quit: 03/13/1982  .  Years since quitting: 35.8  Smokeless Tobacco Never Used   Counseling given: Not Answered   Outpatient Medications Prior to Visit  Medication Sig Dispense Refill  . albuterol (PROVENTIL) (2.5 MG/3ML) 0.083% nebulizer solution Take 2.5 mg by nebulization every 4 (four) hours as needed.      Marland Kitchen aspirin 81 MG tablet Take 81 mg by mouth daily.      Marland Kitchen atenolol (TENORMIN) 50 MG tablet Take 50 mg by mouth daily.      Marland Kitchen atorvastatin (LIPITOR) 20 MG tablet Take 20 mg by mouth at bedtime.    . Cholecalciferol (VITAMIN D-3) 5000 UNITS TABS Take 1 tablet by mouth daily.      . Fluticasone-Salmeterol (ADVAIR DISKUS) 250-50 MCG/DOSE AEPB Inhale 1 puff into the lungs every 12 (twelve) hours.      Marland Kitchen glimepiride (AMARYL) 1 MG tablet Take 1 mg by mouth daily before breakfast.    . omeprazole (PRILOSEC) 20 MG capsule Take 40 mg by mouth daily.     . OXYGEN Inhale 3.5 L into the lungs.    Marland Kitchen telmisartan-hydrochlorothiazide (MICARDIS HCT) 80-12.5 MG per tablet Take 1 tablet by mouth daily.      Marland Kitchen tiotropium (SPIRIVA) 18 MCG inhalation capsule Place 18 mcg into inhaler and inhale daily.       No facility-administered medications prior to visit.      Review of Systems  Constitutional:   No  weight loss, night sweats,  Fevers, chills,  +fatigue, or  lassitude.  HEENT:   No headaches,  Difficulty swallowing,  Tooth/dental problems, or  Sore throat,                No sneezing, itching, ear ache, nasal congestion, post nasal drip,   CV:  No chest pain,  Orthopnea, PND, swelling in lower extremities, anasarca, dizziness, palpitations, syncope.   GI  No heartburn, indigestion, abdominal pain, nausea, vomiting, diarrhea, change in bowel habits, loss of appetite, bloody stools.   Resp:   No chest wall deformity  Skin: no rash or lesions.  GU: no dysuria, change in color of urine, no urgency or frequency.  No flank pain, no hematuria   MS:  No joint pain or swelling.  No decreased range of motion.  No back  pain.    Physical Exam  BP 140/68 (BP Location: Left Arm, Cuff Size: Normal)   Pulse 72   Ht 6' 1.5" (1.867 m)   Wt 235 lb 12.8 oz (107 kg)   SpO2 94%   BMI 30.69 kg/m   GEN: A/Ox3; pleasant , NAD, elderly on oxygen   HEENT:  /AT,  EACs-clear, TMs-wnl, NOSE-clear, THROAT-clear, no lesions, no postnasal drip or exudate noted.   NECK:  Supple w/ fair ROM; no JVD; normal carotid impulses w/o bruits; no thyromegaly or nodules palpated; no lymphadenopathy.    RESP decreased breath sounds in the bases  no accessory muscle use, no dullness to percussion  CARD:  RRR, no m/r/g, trace peripheral edema, pulses intact, no cyanosis or clubbing.  GI:   Soft & nt; nml bowel sounds; no organomegaly or masses detected.   Musco: Warm bil, no deformities or joint swelling noted.   Neuro: alert, no focal deficits noted.    Skin: Warm, no lesions or rashes    Lab Results:  CBC   BNP No results found for: BNP  ProBNP   Imaging: No results found.    No flowsheet data found.  No results found for: NITRICOXIDE      Assessment & Plan:   COPD (chronic obstructive pulmonary disease) Compensated without flare Declines chest xray today , does not qualify for LDCT screening .   Plan  Patient Instructions  Continue Adavir  1 puff Twice daily  , rinse after use .  Continue on Spiriva daily  Continue on Oxygen.  Follow up Dr. Elsworth Soho or Rayden Scheper NP  In 6 months and As needed       Chronic respiratory failure Cont on O2      Kerigan Narvaez, NP 01/18/2018

## 2018-03-20 DIAGNOSIS — J449 Chronic obstructive pulmonary disease, unspecified: Secondary | ICD-10-CM | POA: Diagnosis not present

## 2018-03-20 DIAGNOSIS — C61 Malignant neoplasm of prostate: Secondary | ICD-10-CM | POA: Diagnosis not present

## 2018-03-27 ENCOUNTER — Ambulatory Visit: Payer: Medicare Other | Admitting: Urology

## 2018-03-27 DIAGNOSIS — R351 Nocturia: Secondary | ICD-10-CM

## 2018-03-27 DIAGNOSIS — C61 Malignant neoplasm of prostate: Secondary | ICD-10-CM | POA: Diagnosis not present

## 2018-04-17 DIAGNOSIS — I10 Essential (primary) hypertension: Secondary | ICD-10-CM | POA: Diagnosis not present

## 2018-04-17 DIAGNOSIS — E119 Type 2 diabetes mellitus without complications: Secondary | ICD-10-CM | POA: Diagnosis not present

## 2018-04-17 DIAGNOSIS — J449 Chronic obstructive pulmonary disease, unspecified: Secondary | ICD-10-CM | POA: Diagnosis not present

## 2018-04-17 DIAGNOSIS — E6609 Other obesity due to excess calories: Secondary | ICD-10-CM | POA: Diagnosis not present

## 2018-06-26 DIAGNOSIS — C61 Malignant neoplasm of prostate: Secondary | ICD-10-CM | POA: Diagnosis not present

## 2018-07-01 ENCOUNTER — Ambulatory Visit: Payer: Medicare Other | Admitting: Pulmonary Disease

## 2018-07-03 ENCOUNTER — Ambulatory Visit (INDEPENDENT_AMBULATORY_CARE_PROVIDER_SITE_OTHER): Payer: Medicare Other | Admitting: Urology

## 2018-07-03 DIAGNOSIS — R351 Nocturia: Secondary | ICD-10-CM | POA: Diagnosis not present

## 2018-07-03 DIAGNOSIS — C61 Malignant neoplasm of prostate: Secondary | ICD-10-CM

## 2018-07-24 DIAGNOSIS — I10 Essential (primary) hypertension: Secondary | ICD-10-CM | POA: Diagnosis not present

## 2018-07-24 DIAGNOSIS — E119 Type 2 diabetes mellitus without complications: Secondary | ICD-10-CM | POA: Diagnosis not present

## 2018-07-24 DIAGNOSIS — J449 Chronic obstructive pulmonary disease, unspecified: Secondary | ICD-10-CM | POA: Diagnosis not present

## 2018-09-26 DIAGNOSIS — C61 Malignant neoplasm of prostate: Secondary | ICD-10-CM | POA: Diagnosis not present

## 2018-10-02 ENCOUNTER — Ambulatory Visit (INDEPENDENT_AMBULATORY_CARE_PROVIDER_SITE_OTHER): Payer: Medicare Other | Admitting: Urology

## 2018-10-02 DIAGNOSIS — R351 Nocturia: Secondary | ICD-10-CM

## 2018-10-02 DIAGNOSIS — C61 Malignant neoplasm of prostate: Secondary | ICD-10-CM | POA: Diagnosis not present

## 2018-10-17 DIAGNOSIS — J449 Chronic obstructive pulmonary disease, unspecified: Secondary | ICD-10-CM | POA: Diagnosis not present

## 2018-10-29 DIAGNOSIS — I10 Essential (primary) hypertension: Secondary | ICD-10-CM | POA: Diagnosis not present

## 2018-10-29 DIAGNOSIS — J449 Chronic obstructive pulmonary disease, unspecified: Secondary | ICD-10-CM | POA: Diagnosis not present

## 2018-10-29 DIAGNOSIS — E119 Type 2 diabetes mellitus without complications: Secondary | ICD-10-CM | POA: Diagnosis not present

## 2018-10-29 DIAGNOSIS — Z6830 Body mass index (BMI) 30.0-30.9, adult: Secondary | ICD-10-CM | POA: Diagnosis not present

## 2018-11-07 DIAGNOSIS — H35033 Hypertensive retinopathy, bilateral: Secondary | ICD-10-CM | POA: Diagnosis not present

## 2018-12-25 DIAGNOSIS — C61 Malignant neoplasm of prostate: Secondary | ICD-10-CM | POA: Diagnosis not present

## 2018-12-30 DIAGNOSIS — Z0120 Encounter for dental examination and cleaning without abnormal findings: Secondary | ICD-10-CM | POA: Diagnosis not present

## 2019-01-01 ENCOUNTER — Ambulatory Visit (INDEPENDENT_AMBULATORY_CARE_PROVIDER_SITE_OTHER): Payer: Medicare Other | Admitting: Urology

## 2019-01-01 DIAGNOSIS — C61 Malignant neoplasm of prostate: Secondary | ICD-10-CM | POA: Diagnosis not present

## 2019-01-01 DIAGNOSIS — R351 Nocturia: Secondary | ICD-10-CM

## 2019-02-04 DIAGNOSIS — J449 Chronic obstructive pulmonary disease, unspecified: Secondary | ICD-10-CM | POA: Diagnosis not present

## 2019-02-04 DIAGNOSIS — E119 Type 2 diabetes mellitus without complications: Secondary | ICD-10-CM | POA: Diagnosis not present

## 2019-02-04 DIAGNOSIS — Z0000 Encounter for general adult medical examination without abnormal findings: Secondary | ICD-10-CM | POA: Diagnosis not present

## 2019-02-04 DIAGNOSIS — I10 Essential (primary) hypertension: Secondary | ICD-10-CM | POA: Diagnosis not present

## 2019-02-04 DIAGNOSIS — Z6830 Body mass index (BMI) 30.0-30.9, adult: Secondary | ICD-10-CM | POA: Diagnosis not present

## 2019-03-19 DIAGNOSIS — Z0000 Encounter for general adult medical examination without abnormal findings: Secondary | ICD-10-CM | POA: Diagnosis not present

## 2019-03-19 DIAGNOSIS — I10 Essential (primary) hypertension: Secondary | ICD-10-CM | POA: Diagnosis not present

## 2019-03-19 DIAGNOSIS — J449 Chronic obstructive pulmonary disease, unspecified: Secondary | ICD-10-CM | POA: Diagnosis not present

## 2019-03-19 DIAGNOSIS — E119 Type 2 diabetes mellitus without complications: Secondary | ICD-10-CM | POA: Diagnosis not present

## 2019-03-26 ENCOUNTER — Other Ambulatory Visit: Payer: Self-pay | Admitting: Urology

## 2019-03-26 ENCOUNTER — Encounter: Payer: Self-pay | Admitting: Urology

## 2019-03-26 ENCOUNTER — Other Ambulatory Visit: Payer: Self-pay

## 2019-03-26 ENCOUNTER — Ambulatory Visit (INDEPENDENT_AMBULATORY_CARE_PROVIDER_SITE_OTHER): Payer: Medicare Other | Admitting: Urology

## 2019-03-26 VITALS — BP 162/65 | HR 92 | Temp 97.0°F | Ht 73.5 in | Wt 228.0 lb

## 2019-03-26 DIAGNOSIS — C61 Malignant neoplasm of prostate: Secondary | ICD-10-CM

## 2019-03-26 LAB — PSA: PSA: 0.4 ng/mL (ref <=4.0)

## 2019-03-26 MED ORDER — LEUPROLIDE ACETATE (3 MONTH) 22.5 MG IM KIT
22.5000 mg | PACK | Freq: Once | INTRAMUSCULAR | Status: AC
  Administered 2019-03-26: 22.5 mg via INTRAMUSCULAR

## 2019-03-26 NOTE — Progress Notes (Signed)

## 2019-03-26 NOTE — Progress Notes (Signed)
03/26/2019 2:42 PM   Danny Martinez Feb 07, 1934 [id number]  Referring provider: Neale Burly, MD [address] [place],  [place] [id number]  Prostate cancer  HPI: [name] is a 86yo <HTML><META HTTP-EQUIV="content-type" CONTENT="text/html;charset=utf-8">who was being followed by Dr. Exie Martinez for Gleason 7(4+3) prostate cancer treated in 2003 with RRP. He had a biochemical failure and was on Lupron/Eligard for 5-6 years. He came off of Lupron and the PSA rose again so he is now on chronic Lupron therapy q3 months. The last PSA is see in his notes was &lt;0.1 from 6/17 but he reports that he had one in November that was undetectible still. He is voiding well with no incontinence. He has had no hematuria. He has chronic hot flashes and was on Megace but is off of that now. He is on calcium and Vit D for bone health.. His PSA has been rising on ADT. No recent PSA. Mild hot flashes  Nocturia stable and chronic at 1x on fluid management    PMH: Past Medical History:  Diagnosis Date  . COPD (chronic obstructive pulmonary disease) ([place])   . Emphysema   . History of prostate cancer   . Hypoxemia   . Pulmonary hypertension ([place])   . PVC (premature ventricular contraction)     Surgical History: Past Surgical History:  Procedure Laterality Date  . CYSTECTOMY    . PROSTATECTOMY      Home Medications:  Allergies as of 03/26/2019      Reactions   Neomycin-bacitracin Zn-polymyx    REACTION: rash      Medication List       Accurate as of March 26, 2019  2:42 PM. If you have any questions, ask your nurse or doctor.        Advair Diskus 250-50 MCG/DOSE Aepb Generic drug: Fluticasone-Salmeterol Inhale 1 puff into the lungs every 12 (twelve) hours.   albuterol (2.5 MG/3ML) 0.083% nebulizer solution Commonly known as: PROVENTIL Take 2.5 mg by nebulization every 4 (four) hours as needed.   aspirin 81 MG tablet Take 81 mg by mouth daily.   atenolol 50 MG tablet Commonly known  as: TENORMIN Take 50 mg by mouth daily.   atorvastatin 20 MG tablet Commonly known as: LIPITOR Take 20 mg by mouth at bedtime.   glimepiride 1 MG tablet Commonly known as: AMARYL Take 1 mg by mouth daily before breakfast.   omeprazole 20 MG capsule Commonly known as: PRILOSEC Take 40 mg by mouth daily.   OXYGEN Inhale 3.5 L into the lungs.   telmisartan-hydrochlorothiazide 80-12.5 MG tablet Commonly known as: MICARDIS HCT Take 1 tablet by mouth daily.   tiotropium 18 MCG inhalation capsule Commonly known as: SPIRIVA Place 18 mcg into inhaler and inhale daily.   Vitamin D-3 125 MCG (5000 UT) Tabs Take 1 tablet by mouth daily.       Allergies:  Allergies  Allergen Reactions  . Neomycin-Bacitracin Zn-Polymyx     REACTION: rash    Family History: Family History  Problem Relation Age of Onset  . Sarcoidosis Brother   . Heart disease Father   . Rheum arthritis Sister   . Rheum arthritis Sister   . Rheum arthritis Father   . Cancer Mother     Social History:  reports that he quit smoking about 37 years ago. His smoking use included cigarettes. He has a 20.00 pack-year smoking history. He has never used smokeless tobacco. He reports that he does not drink alcohol or use drugs.  ROS:                                        Physical Exam: BP (!) 162/65   Pulse 92   Temp (!) 97 F (36.1 C)   Ht 6' 1.5" (1.867 m)   Wt 228 lb (103.4 kg)   BMI 29.67 kg/m   Constitutional:  Alert and oriented, No acute distress. HEENT: [place] AT, moist mucus membranes.  Trachea midline, no masses. Cardiovascular: No clubbing, cyanosis, or edema. Respiratory: Normal respiratory effort, no increased work of breathing. GI: Abdomen is soft, nontender, nondistended, no abdominal masses GU: No CVA tenderness Lymph: No cervical or inguinal lymphadenopathy. Skin: No rashes, bruises or suspicious lesions. Neurologic: Grossly intact, no focal deficits, moving all 4  extremities. Psychiatric: Normal mood and affect.  Laboratory Data: Lab Results  Component Value Date   WBC 7.2 10/23/2006   HGB 9.7 (L) 10/23/2006   HCT 29.7 (L) 10/23/2006   MCV 88.3 10/23/2006   PLT 353 10/23/2006    Lab Results  Component Value Date   CREATININE 1.58 (H) 10/20/2006    No results found for: PSA  No results found for: TESTOSTERONE  No results found for: HGBA1C  Urinalysis    Component Value Date/Time   COLORURINE YELLOW 10/14/2006 1821   APPEARANCEUR CLOUDY (A) 10/14/2006 1821   LABSPEC 1.010 10/14/2006 1821   PHURINE 5.5 10/14/2006 1821   GLUCOSEU NEGATIVE 10/14/2006 1821   HGBUR TRACE (A) 10/14/2006 1821   BILIRUBINUR NEGATIVE 10/14/2006 1821   KETONESUR NEGATIVE 10/14/2006 1821   PROTEINUR NEGATIVE 10/14/2006 1821   UROBILINOGEN 0.2 10/14/2006 1821   NITRITE NEGATIVE 10/14/2006 1821   LEUKOCYTESUR SMALL (A) 10/14/2006 1821    Lab Results  Component Value Date   BACTERIA FEW (A) 10/14/2006    Pertinent Imaging:  No results found for this or any previous visit. No results found for this or any previous visit. No results found for this or any previous visit. No results found for this or any previous visit. No results found for this or any previous visit. No results found for this or any previous visit. No results found for this or any previous visit. No results found for this or any previous visit.  Assessment & Plan:    1. Prostate cancer: -Eligard 22.5mg  (7.5mg  monthly) -PSA today, will call with results. If stable RTC 3 months with PSA   No follow-ups on file.  Danny Bang, MD  [facility] [place]

## 2019-03-27 ENCOUNTER — Other Ambulatory Visit: Payer: Self-pay | Admitting: Urology

## 2019-03-31 ENCOUNTER — Encounter: Payer: Self-pay | Admitting: Urology

## 2019-04-09 ENCOUNTER — Telehealth: Payer: Self-pay

## 2019-04-18 NOTE — Telephone Encounter (Signed)
Pt. Called saying on his review of the visit he got there was no Jcode listed that he uses to get reimbursed for a cancer ins poilcy he has. I looked in chart and found it. Pt. Was to come pick it up.

## 2019-05-07 DIAGNOSIS — Z6830 Body mass index (BMI) 30.0-30.9, adult: Secondary | ICD-10-CM | POA: Diagnosis not present

## 2019-05-07 DIAGNOSIS — E119 Type 2 diabetes mellitus without complications: Secondary | ICD-10-CM | POA: Diagnosis not present

## 2019-05-07 DIAGNOSIS — I10 Essential (primary) hypertension: Secondary | ICD-10-CM | POA: Diagnosis not present

## 2019-05-07 DIAGNOSIS — J449 Chronic obstructive pulmonary disease, unspecified: Secondary | ICD-10-CM | POA: Diagnosis not present

## 2019-05-15 DIAGNOSIS — J449 Chronic obstructive pulmonary disease, unspecified: Secondary | ICD-10-CM | POA: Diagnosis not present

## 2019-06-24 ENCOUNTER — Other Ambulatory Visit: Payer: Self-pay | Admitting: Urology

## 2019-06-24 DIAGNOSIS — C61 Malignant neoplasm of prostate: Secondary | ICD-10-CM | POA: Diagnosis not present

## 2019-06-24 LAB — PSA: PSA: 0.3 ng/mL (ref <=4.0)

## 2019-06-30 ENCOUNTER — Telehealth: Payer: Self-pay

## 2019-06-30 NOTE — Telephone Encounter (Signed)
Results sent via my chart 

## 2019-06-30 NOTE — Telephone Encounter (Signed)
-----   Message from Cleon Gustin, MD sent at 06/29/2019 11:24 AM EDT ----- PSA normal ----- Message ----- From: Dorisann Frames, RN Sent: 06/27/2019   8:31 AM EDT To: Cleon Gustin, MD  Please review

## 2019-07-07 ENCOUNTER — Other Ambulatory Visit: Payer: Self-pay

## 2019-07-07 ENCOUNTER — Encounter: Payer: Self-pay | Admitting: Urology

## 2019-07-07 ENCOUNTER — Ambulatory Visit (INDEPENDENT_AMBULATORY_CARE_PROVIDER_SITE_OTHER): Payer: Medicare Other | Admitting: Urology

## 2019-07-07 VITALS — BP 152/70 | HR 77 | Temp 97.5°F

## 2019-07-07 DIAGNOSIS — C61 Malignant neoplasm of prostate: Secondary | ICD-10-CM | POA: Diagnosis not present

## 2019-07-07 MED ORDER — LEUPROLIDE ACETATE (3 MONTH) 22.5 MG IM KIT
22.5000 mg | PACK | Freq: Once | INTRAMUSCULAR | Status: AC
  Administered 2019-07-07: 22.5 mg via INTRAMUSCULAR

## 2019-07-07 NOTE — Progress Notes (Signed)
07/07/2019 3:02 PM   Danny Martinez 04-Jun-1933 [id number]  Referring provider: Neale Burly, MD [address] [place],  [place] [id number]  Prostate cancer  HPI:  Danny Martinez is a 86yo with a hx of prostate cancer here for followup.  who was being followed by Dr. Exie Martinez for Gleason 7(4+3) prostate cancer treated in 2003 with RRP. He had a biochemical failure and was on Lupron/Eligard for 5-6 years. He came off of Lupron and the PSA rose again so he is now on chronic Lupron therapy q3 months. The last PSA is see in his notes was &lt;0.1 from 6/17 but he reports that he had one in November that was undetectible still. He is voiding well with no incontinence. He has had no hematuria. He has chronic hot flashes and was on Megace but is off of that now. He is on calcium and Vit D for bone health.. His PSA has been rising on ADT.   PSa decreased to 0.3 from 0.4. no worsening LUTS. He due lupron 22.5mg  today.  PMH: Past Medical History:  Diagnosis Date  . COPD (chronic obstructive pulmonary disease) ([place])   . Emphysema   . History of prostate cancer   . Hypoxemia   . Pulmonary hypertension ([place])   . PVC (premature ventricular contraction)     Surgical History: Past Surgical History:  Procedure Laterality Date  . CYSTECTOMY    . PROSTATECTOMY      Home Medications:  Allergies as of 07/07/2019      Reactions   Neomycin-bacitracin Zn-polymyx    REACTION: rash      Medication List       Accurate as of July 07, 2019  3:02 PM. If you have any questions, ask your nurse or doctor.        Advair Diskus 250-50 MCG/DOSE Aepb Generic drug: Fluticasone-Salmeterol Inhale 1 puff into the lungs every 12 (twelve) hours.   albuterol (2.5 MG/3ML) 0.083% nebulizer solution Commonly known as: PROVENTIL Take 2.5 mg by nebulization every 4 (four) hours as needed.   aspirin 81 MG tablet Take 81 mg by mouth daily.   atenolol 50 MG tablet Commonly known as: TENORMIN Take 50 mg by  mouth daily.   atorvastatin 20 MG tablet Commonly known as: LIPITOR Take 20 mg by mouth at bedtime.   glimepiride 1 MG tablet Commonly known as: AMARYL Take 1 mg by mouth daily before breakfast.   omeprazole 20 MG capsule Commonly known as: PRILOSEC Take 40 mg by mouth daily.   OXYGEN Inhale 3.5 L into the lungs.   telmisartan-hydrochlorothiazide 80-12.5 MG tablet Commonly known as: MICARDIS HCT Take 1 tablet by mouth daily.   tiotropium 18 MCG inhalation capsule Commonly known as: SPIRIVA Place 18 mcg into inhaler and inhale daily.   Vitamin D-3 125 MCG (5000 UT) Tabs Take 1 tablet by mouth daily.       Allergies:  Allergies  Allergen Reactions  . Neomycin-Bacitracin Zn-Polymyx     REACTION: rash    Family History: Family History  Problem Relation Age of Onset  . Sarcoidosis Brother   . Heart disease Father   . Rheum arthritis Sister   . Rheum arthritis Sister   . Rheum arthritis Father   . Cancer Mother     Social History:  reports that he quit smoking about 37 years ago. His smoking use included cigarettes. He has a 20.00 pack-year smoking history. He has never used smokeless tobacco. He reports that he does  not drink alcohol or use drugs.  ROS: All other review of systems were reviewed and are negative except what is noted above in HPI  Physical Exam: BP (!) 152/70   Pulse 77   Temp (!) 97.5 F (36.4 C)   Constitutional:  Alert and oriented, No acute distress. HEENT: [place] AT, moist mucus membranes.  Trachea midline, no masses. Cardiovascular: No clubbing, cyanosis, or edema. Respiratory: Normal respiratory effort, no increased work of breathing. GI: Abdomen is soft, nontender, nondistended, no abdominal masses GU: No CVA tenderness.  Lymph: No cervical or inguinal lymphadenopathy. Skin: No rashes, bruises or suspicious lesions. Neurologic: Grossly intact, no focal deficits, moving all 4 extremities. Psychiatric: Normal mood and  affect.  Laboratory Data: Lab Results  Component Value Date   WBC 7.2 10/23/2006   HGB 9.7 (L) 10/23/2006   HCT 29.7 (L) 10/23/2006   MCV 88.3 10/23/2006   PLT 353 10/23/2006    Lab Results  Component Value Date   CREATININE 1.58 (H) 10/20/2006    Lab Results  Component Value Date   PSA 0.3 06/24/2019   PSA 0.4 03/26/2019    No results found for: TESTOSTERONE  No results found for: HGBA1C  Urinalysis    Component Value Date/Time   COLORURINE YELLOW 10/14/2006 1821   APPEARANCEUR CLOUDY (A) 10/14/2006 1821   LABSPEC 1.010 10/14/2006 1821   PHURINE 5.5 10/14/2006 1821   GLUCOSEU NEGATIVE 10/14/2006 1821   HGBUR TRACE (A) 10/14/2006 1821   BILIRUBINUR NEGATIVE 10/14/2006 1821   KETONESUR NEGATIVE 10/14/2006 1821   PROTEINUR NEGATIVE 10/14/2006 1821   UROBILINOGEN 0.2 10/14/2006 1821   NITRITE NEGATIVE 10/14/2006 1821   LEUKOCYTESUR SMALL (A) 10/14/2006 1821    Lab Results  Component Value Date   BACTERIA FEW (A) 10/14/2006    Pertinent Imaging:  No results found for this or any previous visit. No results found for this or any previous visit. No results found for this or any previous visit. No results found for this or any previous visit. No results found for this or any previous visit. No results found for this or any previous visit. No results found for this or any previous visit. No results found for this or any previous visit.  Assessment & Plan:    1. Prostate cancer ([place]) -Lupron 22.5mg  today -RTC 3 months with PSA   No follow-ups on file.  Danny Bang, MD  [facility] [place]

## 2019-07-07 NOTE — Progress Notes (Signed)

## 2019-07-07 NOTE — Patient Instructions (Signed)
Prostate Cancer  The prostate is a male gland that helps make semen. Prostate cancer is when abnormal cells grow in this gland. Follow these instructions at home:  Take over-the-counter and prescription medicines only as told by your doctor.  Eat a healthy diet.  Get plenty of sleep.  Ask your doctor for help to find a support group for men with prostate cancer.  Keep all follow-up visits as told by your doctor. This is important.  If you have to go to the hospital, let your cancer doctor (oncologist) know.  Touch, hold, hug, and caress your partner to continue to show sexual feelings. Contact a doctor if:  You have trouble peeing (urinating).  You have blood in your pee (urine).  You have pain in your hips, back, or chest. Get help right away if:  You have weakness in your legs.  You lose feeling (have numbness) in your legs.  You cannot control your pee or your poop (stool).  You have trouble breathing.  You have sudden pain in your chest.  You have chills or a fever. Summary  The prostate is a male gland that helps make semen. Prostate cancer is when abnormal cells grow in this gland.  Ask your doctor for help to find a support group for men with prostate cancer.  Contact a doctor if you have problems peeing or have any new pain that you did not have before. This information is not intended to replace advice given to you by your health care provider. Make sure you discuss any questions you have with your health care provider. Document Revised: 02/09/2017 Document Reviewed: 11/08/2015 Elsevier Patient Education  2020 Elsevier Inc.  

## 2019-07-10 DIAGNOSIS — Z0120 Encounter for dental examination and cleaning without abnormal findings: Secondary | ICD-10-CM | POA: Diagnosis not present

## 2019-07-29 DIAGNOSIS — Z6830 Body mass index (BMI) 30.0-30.9, adult: Secondary | ICD-10-CM | POA: Diagnosis not present

## 2019-07-29 DIAGNOSIS — J449 Chronic obstructive pulmonary disease, unspecified: Secondary | ICD-10-CM | POA: Diagnosis not present

## 2019-07-29 DIAGNOSIS — E119 Type 2 diabetes mellitus without complications: Secondary | ICD-10-CM | POA: Diagnosis not present

## 2019-07-29 DIAGNOSIS — I10 Essential (primary) hypertension: Secondary | ICD-10-CM | POA: Diagnosis not present

## 2019-07-29 DIAGNOSIS — Z0000 Encounter for general adult medical examination without abnormal findings: Secondary | ICD-10-CM | POA: Diagnosis not present

## 2019-09-30 ENCOUNTER — Other Ambulatory Visit: Payer: Self-pay | Admitting: Urology

## 2019-09-30 DIAGNOSIS — C61 Malignant neoplasm of prostate: Secondary | ICD-10-CM | POA: Diagnosis not present

## 2019-09-30 LAB — PSA: PSA: 0.3 ng/mL (ref <=4.0)

## 2019-10-06 ENCOUNTER — Other Ambulatory Visit: Payer: Self-pay

## 2019-10-06 ENCOUNTER — Ambulatory Visit (INDEPENDENT_AMBULATORY_CARE_PROVIDER_SITE_OTHER): Payer: Medicare Other | Admitting: Urology

## 2019-10-06 ENCOUNTER — Encounter: Payer: Self-pay | Admitting: Urology

## 2019-10-06 VITALS — BP 155/68 | HR 81 | Temp 98.4°F | Ht 73.5 in | Wt 228.0 lb

## 2019-10-06 DIAGNOSIS — C61 Malignant neoplasm of prostate: Secondary | ICD-10-CM

## 2019-10-06 MED ORDER — LEUPROLIDE ACETATE (3 MONTH) 22.5 MG IM KIT
22.5000 mg | PACK | Freq: Once | INTRAMUSCULAR | Status: AC
  Administered 2019-10-06: 22.5 mg via INTRAMUSCULAR

## 2019-10-06 NOTE — Progress Notes (Signed)
10/06/2019 1:58 PM   Danny Martinez June 26, 1933 [id number]  Referring provider: Neale Burly, MD [address] [place],  [place] [address]  Prostate cancer  HPI: Danny Martinez is a 86yo here for followup for prostate cancer. PSA stable at 0.3. Mild hot flashes. Energy good. No urinary incontinence.    PMH: Past Medical History:  Diagnosis Date  . COPD (chronic obstructive pulmonary disease) ([place])   . Emphysema   . History of prostate cancer   . Hypoxemia   . Pulmonary hypertension ([place])   . PVC (premature ventricular contraction)     Surgical History: Past Surgical History:  Procedure Laterality Date  . CYSTECTOMY    . PROSTATECTOMY      Home Medications:  Allergies as of 10/06/2019      Reactions   Neomycin-bacitracin Zn-polymyx    REACTION: rash      Medication List       Accurate as of October 06, 2019  1:58 PM. If you have any questions, ask your nurse or doctor.        Advair Diskus 250-50 MCG/DOSE Aepb Generic drug: Fluticasone-Salmeterol Inhale 1 puff into the lungs every 12 (twelve) hours.   albuterol (2.5 MG/3ML) 0.083% nebulizer solution Commonly known as: PROVENTIL Take 2.5 mg by nebulization every 4 (four) hours as needed.   aspirin 81 MG tablet Take 81 mg by mouth daily.   atenolol 50 MG tablet Commonly known as: TENORMIN Take 50 mg by mouth daily.   atorvastatin 20 MG tablet Commonly known as: LIPITOR Take 20 mg by mouth at bedtime.   famotidine 20 MG tablet Commonly known as: PEPCID Take 20 mg by mouth 2 (two) times daily.   glimepiride 1 MG tablet Commonly known as: AMARYL Take 1 mg by mouth daily before breakfast.   omeprazole 20 MG capsule Commonly known as: PRILOSEC Take 40 mg by mouth daily.   OXYGEN Inhale 3.5 L into the lungs.   sodium chloride (hypertonic) 3 % solution SMARTSIG:3 Milliliter(s) Via Nebulizer Twice Daily PRN   telmisartan-hydrochlorothiazide 80-12.5 MG tablet Commonly known as: MICARDIS HCT Take 1  tablet by mouth daily.   tiotropium 18 MCG inhalation capsule Commonly known as: SPIRIVA Place 18 mcg into inhaler and inhale daily.   Vitamin D-3 125 MCG (5000 UT) Tabs Take 1 tablet by mouth daily.       Allergies:  Allergies  Allergen Reactions  . Neomycin-Bacitracin Zn-Polymyx     REACTION: rash    Family History: Family History  Problem Relation Age of Onset  . Sarcoidosis Brother   . Heart disease Father   . Rheum arthritis Sister   . Rheum arthritis Sister   . Rheum arthritis Father   . Cancer Mother     Social History:  reports that he quit smoking about 37 years ago. His smoking use included cigarettes. He has a 20.00 pack-year smoking history. He has never used smokeless tobacco. He reports that he does not drink alcohol and does not use drugs.  ROS: All other review of systems were reviewed and are negative except what is noted above in HPI  Physical Exam: BP (!) 155/68   Pulse 81   Temp 98.4 F (36.9 C)   Ht 6' 1.5" (1.867 m)   Wt (!) 228 lb (103.4 kg)   BMI 29.67 kg/m   Constitutional:  Alert and oriented, No acute distress. HEENT: [place] AT, moist mucus membranes.  Trachea midline, no masses. Cardiovascular: No clubbing, cyanosis, or edema.  Respiratory: Normal respiratory effort, no increased work of breathing. GI: Abdomen is soft, nontender, nondistended, no abdominal masses GU: No CVA tenderness.  Lymph: No cervical or inguinal lymphadenopathy. Skin: No rashes, bruises or suspicious lesions. Neurologic: Grossly intact, no focal deficits, moving all 4 extremities. Psychiatric: Normal mood and affect.  Laboratory Data: Lab Results  Component Value Date   WBC 7.2 10/23/2006   HGB 9.7 (L) 10/23/2006   HCT 29.7 (L) 10/23/2006   MCV 88.3 10/23/2006   PLT 353 10/23/2006    Lab Results  Component Value Date   CREATININE 1.58 (H) 10/20/2006    Lab Results  Component Value Date   PSA 0.3 09/30/2019   PSA 0.3 06/24/2019   PSA 0.4 03/26/2019      No results found for: TESTOSTERONE  No results found for: HGBA1C  Urinalysis    Component Value Date/Time   COLORURINE YELLOW 10/14/2006 1821   APPEARANCEUR CLOUDY (A) 10/14/2006 1821   LABSPEC 1.010 10/14/2006 1821   PHURINE 5.5 10/14/2006 1821   GLUCOSEU NEGATIVE 10/14/2006 1821   HGBUR TRACE (A) 10/14/2006 1821   BILIRUBINUR NEGATIVE 10/14/2006 1821   KETONESUR NEGATIVE 10/14/2006 1821   PROTEINUR NEGATIVE 10/14/2006 1821   UROBILINOGEN 0.2 10/14/2006 1821   NITRITE NEGATIVE 10/14/2006 1821   LEUKOCYTESUR SMALL (A) 10/14/2006 1821    Lab Results  Component Value Date   BACTERIA FEW (A) 10/14/2006    Pertinent Imaging:  No results found for this or any previous visit.  No results found for this or any previous visit.  No results found for this or any previous visit.  No results found for this or any previous visit.  No results found for this or any previous visit.  No results found for this or any previous visit.  No results found for this or any previous visit.  No results found for this or any previous visit.   Assessment & Plan:    1. Prostate cancer ([place]) -Lupron 22.5mg  today (7.5mg  monthly) -RTC 3 months with PSA and [place] 22.5mg   - leuprolide (LUPRON) injection 22.5 mg   No follow-ups on file.  Nicolette Bang, MD  [facility] [place]

## 2019-10-06 NOTE — Patient Instructions (Addendum)
Prostate Cancer  The prostate is a male gland that helps make semen. Prostate cancer is when abnormal cells grow in this gland. Follow these instructions at home:  Take over-the-counter and prescription medicines only as told by your doctor.  Eat a healthy diet.  Get plenty of sleep.  Ask your doctor for help to find a support group for men with prostate cancer.  Keep all follow-up visits as told by your doctor. This is important.  If you have to go to the hospital, let your cancer doctor (oncologist) know.  Touch, hold, hug, and caress your partner to continue to show sexual feelings. Contact a doctor if:  You have trouble peeing (urinating).  You have blood in your pee (urine).  You have pain in your hips, back, or chest. Get help right away if:  You have weakness in your legs.  You lose feeling (have numbness) in your legs.  You cannot control your pee or your poop (stool).  You have trouble breathing.  You have sudden pain in your chest.  You have chills or a fever. Summary  The prostate is a male gland that helps make semen. Prostate cancer is when abnormal cells grow in this gland.  Ask your doctor for help to find a support group for men with prostate cancer.  Contact a doctor if you have problems peeing or have any new pain that you did not have before. This information is not intended to replace advice given to you by your health care provider. Make sure you discuss any questions you have with your health care provider. Document Revised: 02/09/2017 Document Reviewed: 11/08/2015 Elsevier Patient Education  2020 Elsevier Inc.  

## 2019-10-06 NOTE — Progress Notes (Signed)

## 2019-11-04 DIAGNOSIS — J449 Chronic obstructive pulmonary disease, unspecified: Secondary | ICD-10-CM | POA: Diagnosis not present

## 2019-11-04 DIAGNOSIS — Z6830 Body mass index (BMI) 30.0-30.9, adult: Secondary | ICD-10-CM | POA: Diagnosis not present

## 2019-11-04 DIAGNOSIS — E119 Type 2 diabetes mellitus without complications: Secondary | ICD-10-CM | POA: Diagnosis not present

## 2019-11-04 DIAGNOSIS — I10 Essential (primary) hypertension: Secondary | ICD-10-CM | POA: Diagnosis not present

## 2019-12-30 ENCOUNTER — Other Ambulatory Visit: Payer: Self-pay

## 2019-12-30 ENCOUNTER — Other Ambulatory Visit: Payer: Medicare Other

## 2019-12-30 DIAGNOSIS — C61 Malignant neoplasm of prostate: Secondary | ICD-10-CM

## 2019-12-31 LAB — PSA: Prostate Specific Ag, Serum: 0.4 ng/mL (ref 0.0–4.0)

## 2020-01-08 ENCOUNTER — Ambulatory Visit: Payer: Medicare Other | Admitting: Urology

## 2020-01-08 ENCOUNTER — Telehealth: Payer: Self-pay

## 2020-01-08 NOTE — Telephone Encounter (Signed)
Pt notified of PSA result. 

## 2020-01-08 NOTE — Telephone Encounter (Signed)
-----   Message from Cleon Gustin, MD sent at 01/07/2020  6:36 PM EDT ----- PSA stable ----- Message ----- From: Valentina Lucks, LPN Sent: [id number]   1:05 PM EDT To: Cleon Gustin, MD  Pls. Review.

## 2020-01-14 ENCOUNTER — Ambulatory Visit (INDEPENDENT_AMBULATORY_CARE_PROVIDER_SITE_OTHER): Payer: Medicare Other | Admitting: Urology

## 2020-01-14 ENCOUNTER — Other Ambulatory Visit: Payer: Self-pay

## 2020-01-14 ENCOUNTER — Encounter: Payer: Self-pay | Admitting: Urology

## 2020-01-14 VITALS — BP 132/65 | HR 90 | Temp 97.2°F | Ht 71.5 in | Wt 228.0 lb

## 2020-01-14 DIAGNOSIS — C61 Malignant neoplasm of prostate: Secondary | ICD-10-CM

## 2020-01-14 LAB — URINALYSIS, ROUTINE W REFLEX MICROSCOPIC
Bilirubin, UA: NEGATIVE
Glucose, UA: NEGATIVE
Ketones, UA: NEGATIVE
Leukocytes,UA: NEGATIVE
Nitrite, UA: NEGATIVE
Protein,UA: NEGATIVE
RBC, UA: NEGATIVE
Specific Gravity, UA: 1.02 (ref 1.005–1.030)
Urobilinogen, Ur: 0.2 mg/dL (ref 0.2–1.0)
pH, UA: 7 (ref 5.0–7.5)

## 2020-01-14 MED ORDER — LEUPROLIDE ACETATE (3 MONTH) 22.5 MG IM KIT
22.5000 mg | PACK | Freq: Once | INTRAMUSCULAR | Status: AC
  Administered 2020-01-14: 22.5 mg via INTRAMUSCULAR

## 2020-01-14 NOTE — Progress Notes (Signed)

## 2020-01-14 NOTE — Patient Instructions (Signed)
Prostate Cancer  The prostate is a male gland that helps make semen. Prostate cancer is when abnormal cells grow in this gland. Follow these instructions at home:  Take over-the-counter and prescription medicines only as told by your doctor.  Eat a healthy diet.  Get plenty of sleep.  Ask your doctor for help to find a support group for men with prostate cancer.  Keep all follow-up visits as told by your doctor. This is important.  If you have to go to the hospital, let your cancer doctor (oncologist) know.  Touch, hold, hug, and caress your partner to continue to show sexual feelings. Contact a doctor if:  You have trouble peeing (urinating).  You have blood in your pee (urine).  You have pain in your hips, back, or chest. Get help right away if:  You have weakness in your legs.  You lose feeling (have numbness) in your legs.  You cannot control your pee or your poop (stool).  You have trouble breathing.  You have sudden pain in your chest.  You have chills or a fever. Summary  The prostate is a male gland that helps make semen. Prostate cancer is when abnormal cells grow in this gland.  Ask your doctor for help to find a support group for men with prostate cancer.  Contact a doctor if you have problems peeing or have any new pain that you did not have before. This information is not intended to replace advice given to you by your health care provider. Make sure you discuss any questions you have with your health care provider. Document Revised: 02/09/2017 Document Reviewed: 11/08/2015 Elsevier Patient Education  2020 Elsevier Inc.  

## 2020-01-14 NOTE — Progress Notes (Signed)
01/14/2020 1:51 PM   Danny Martinez 09-04-1933 [id number]  Referring provider: Neale Burly, MD [address] [place],  [place] [address]  Followup prostate cancer  HPI: Danny Martinez is a 86yo here for followup for metastatic prostate cancer. He is on lupron. PSA increased to 0.4 from 0.3. NO SUI or UUI. No hematuria or dysuria. Good energy. No fatigue. NO hot flashes   PMH: Past Medical History:  Diagnosis Date  . COPD (chronic obstructive pulmonary disease) ([place])   . Emphysema   . History of prostate cancer   . Hypoxemia   . Pulmonary hypertension ([place])   . PVC (premature ventricular contraction)     Surgical History: Past Surgical History:  Procedure Laterality Date  . CYSTECTOMY    . PROSTATECTOMY      Home Medications:  Allergies as of 01/14/2020      Reactions   Neomycin-bacitracin Zn-polymyx    REACTION: rash      Medication List       Accurate as of January 14, 2020  1:51 PM. If you have any questions, ask your nurse or doctor.        Advair Diskus 250-50 MCG/DOSE Aepb Generic drug: Fluticasone-Salmeterol Inhale 1 puff into the lungs every 12 (twelve) hours.   albuterol (2.5 MG/3ML) 0.083% nebulizer solution Commonly known as: PROVENTIL Take 2.5 mg by nebulization every 4 (four) hours as needed.   aspirin 81 MG tablet Take 81 mg by mouth daily.   atenolol 50 MG tablet Commonly known as: TENORMIN Take 50 mg by mouth daily.   atorvastatin 20 MG tablet Commonly known as: LIPITOR Take 20 mg by mouth at bedtime.   famotidine 20 MG tablet Commonly known as: PEPCID Take 20 mg by mouth 2 (two) times daily.   glimepiride 1 MG tablet Commonly known as: AMARYL Take 1 mg by mouth daily before breakfast.   metFORMIN 500 MG tablet Commonly known as: GLUCOPHAGE Take 500 mg by mouth 2 (two) times daily.   omeprazole 20 MG capsule Commonly known as: PRILOSEC Take 40 mg by mouth daily.   OXYGEN Inhale 3.5 L into the lungs.   sodium chloride  (hypertonic) 3 % solution SMARTSIG:3 Milliliter(s) Via Nebulizer Twice Daily PRN   telmisartan-hydrochlorothiazide 80-25 MG tablet Commonly known as: MICARDIS HCT Take 1 tablet by mouth daily. What changed: Another medication with the same name was removed. Continue taking this medication, and follow the directions you see here. Changed by: Danny Bang, MD   tiotropium 18 MCG inhalation capsule Commonly known as: SPIRIVA Place 18 mcg into inhaler and inhale daily.   Vitamin D-3 125 MCG (5000 UT) Tabs Take 1 tablet by mouth daily.       Allergies:  Allergies  Allergen Reactions  . Neomycin-Bacitracin Zn-Polymyx     REACTION: rash    Family History: Family History  Problem Relation Age of Onset  . Sarcoidosis Brother   . Heart disease Father   . Rheum arthritis Sister   . Rheum arthritis Sister   . Rheum arthritis Father   . Cancer Mother     Social History:  reports that he quit smoking about 37 years ago. His smoking use included cigarettes. He has a 20.00 pack-year smoking history. He has never used smokeless tobacco. He reports that he does not drink alcohol and does not use drugs.  ROS: All other review of systems were reviewed and are negative except what is noted above in HPI  Physical Exam: BP 132/65  Pulse 90   Temp (!) 97.2 F (36.2 C)   Ht 5' 11.5" (1.816 m)   Wt 228 lb (103.4 kg)   BMI 31.36 kg/m   Constitutional:  Alert and oriented, No acute distress. HEENT: [place] AT, moist mucus membranes.  Trachea midline, no masses. Cardiovascular: No clubbing, cyanosis, or edema. Respiratory: Normal respiratory effort, no increased work of breathing. GI: Abdomen is soft, nontender, nondistended, no abdominal masses GU: No CVA tenderness.  Lymph: No cervical or inguinal lymphadenopathy. Skin: No rashes, bruises or suspicious lesions. Neurologic: Grossly intact, no focal deficits, moving all 4 extremities. Psychiatric: Normal mood and affect.  Laboratory  Data: Lab Results  Component Value Date   WBC 7.2 10/23/2006   HGB 9.7 (L) 10/23/2006   HCT 29.7 (L) 10/23/2006   MCV 88.3 10/23/2006   PLT 353 10/23/2006    Lab Results  Component Value Date   CREATININE 1.58 (H) 10/20/2006    Lab Results  Component Value Date   PSA 0.3 09/30/2019   PSA 0.3 06/24/2019   PSA 0.4 03/26/2019    No results found for: TESTOSTERONE  No results found for: HGBA1C  Urinalysis    Component Value Date/Time   COLORURINE YELLOW 10/14/2006 1821   APPEARANCEUR CLOUDY (A) 10/14/2006 1821   LABSPEC 1.010 10/14/2006 1821   PHURINE 5.5 10/14/2006 1821   GLUCOSEU NEGATIVE 10/14/2006 1821   HGBUR TRACE (A) 10/14/2006 1821   BILIRUBINUR NEGATIVE 10/14/2006 1821   KETONESUR NEGATIVE 10/14/2006 1821   PROTEINUR NEGATIVE 10/14/2006 1821   UROBILINOGEN 0.2 10/14/2006 1821   NITRITE NEGATIVE 10/14/2006 1821   LEUKOCYTESUR SMALL (A) 10/14/2006 1821    Lab Results  Component Value Date   BACTERIA FEW (A) 10/14/2006    Pertinent Imaging:  No results found for this or any previous visit.  No results found for this or any previous visit.  No results found for this or any previous visit.  No results found for this or any previous visit.  No results found for this or any previous visit.  No results found for this or any previous visit.  No results found for this or any previous visit.  No results found for this or any previous visit.   Assessment & Plan:    1. Prostate cancer ([place]) -RTC 3 months with PSA and for lupron 22.5mg  - Urinalysis, Routine w reflex microscopic - leuprolide (LUPRON) injection 22.5 mg (7.5mg  monthly)   Return in about 3 months (around 04/15/2020) for PSA and lupron 22.5mg .  Danny Bang, MD  [facility] [place]

## 2020-02-02 DIAGNOSIS — Z0120 Encounter for dental examination and cleaning without abnormal findings: Secondary | ICD-10-CM | POA: Diagnosis not present

## 2020-02-03 DIAGNOSIS — Z6830 Body mass index (BMI) 30.0-30.9, adult: Secondary | ICD-10-CM | POA: Diagnosis not present

## 2020-02-03 DIAGNOSIS — J449 Chronic obstructive pulmonary disease, unspecified: Secondary | ICD-10-CM | POA: Diagnosis not present

## 2020-02-03 DIAGNOSIS — E1169 Type 2 diabetes mellitus with other specified complication: Secondary | ICD-10-CM | POA: Diagnosis not present

## 2020-02-03 DIAGNOSIS — I10 Essential (primary) hypertension: Secondary | ICD-10-CM | POA: Diagnosis not present

## 2020-03-31 ENCOUNTER — Other Ambulatory Visit: Payer: Medicare Other

## 2020-04-07 ENCOUNTER — Ambulatory Visit: Payer: Medicare Other | Admitting: Urology

## 2020-04-09 ENCOUNTER — Other Ambulatory Visit: Payer: Medicare Other

## 2020-04-09 ENCOUNTER — Other Ambulatory Visit: Payer: Self-pay

## 2020-04-09 DIAGNOSIS — C61 Malignant neoplasm of prostate: Secondary | ICD-10-CM

## 2020-04-10 LAB — PSA: Prostate Specific Ag, Serum: 0.6 ng/mL (ref 0.0–4.0)

## 2020-04-15 ENCOUNTER — Other Ambulatory Visit (INDEPENDENT_AMBULATORY_CARE_PROVIDER_SITE_OTHER): Payer: Medicare Other

## 2020-04-15 ENCOUNTER — Other Ambulatory Visit: Payer: Self-pay

## 2020-04-15 ENCOUNTER — Encounter: Payer: Self-pay | Admitting: Urology

## 2020-04-15 ENCOUNTER — Ambulatory Visit (INDEPENDENT_AMBULATORY_CARE_PROVIDER_SITE_OTHER): Payer: Medicare Other | Admitting: Urology

## 2020-04-15 VITALS — BP 132/60 | HR 68 | Temp 98.5°F | Ht 73.5 in | Wt 228.0 lb

## 2020-04-15 DIAGNOSIS — C61 Malignant neoplasm of prostate: Secondary | ICD-10-CM

## 2020-04-15 LAB — URINALYSIS, ROUTINE W REFLEX MICROSCOPIC
Bilirubin, UA: NEGATIVE
Glucose, UA: NEGATIVE
Ketones, UA: NEGATIVE
Nitrite, UA: NEGATIVE
Protein,UA: NEGATIVE
RBC, UA: NEGATIVE
Specific Gravity, UA: 1.02 (ref 1.005–1.030)
Urobilinogen, Ur: 0.2 mg/dL (ref 0.2–1.0)
pH, UA: 7.5 (ref 5.0–7.5)

## 2020-04-15 LAB — MICROSCOPIC EXAMINATION
Bacteria, UA: NONE SEEN
RBC: NONE SEEN /hpf (ref 0–2)
Renal Epithel, UA: NONE SEEN /hpf

## 2020-04-15 MED ORDER — LEUPROLIDE ACETATE (3 MONTH) 22.5 MG IM KIT
22.5000 mg | PACK | Freq: Once | INTRAMUSCULAR | Status: AC
  Administered 2020-04-15: 22.5 mg via INTRAMUSCULAR

## 2020-04-15 NOTE — Progress Notes (Signed)
Lupron IM Injection   Due to Prostate Cancer patient is present today for a Lupron Injection.  Medication: Lupron 3 month Dose: 22.5 mg  Location: left upper outer buttocks Lot: [id number] Exp: 05/02/20  Patient tolerated well, no complications were noted  Performed by: Antionette Char, Bertrand Vowels,LPN

## 2020-04-15 NOTE — Progress Notes (Signed)
Urological Symptom Review  Patient is experiencing the following symptoms:    None   Gastrointestinal (upper)  : Negative for upper GI symptoms  Gastrointestinal (lower) : Negative for lower GI symptoms  Constitutional : Negative for symptoms  Skin: Negative for skin symptoms  Eyes: Negative for eye symptoms  Ear/Nose/Throat : Negative for Ear/Nose/Throat symptoms  Hematologic/Lymphatic: Negative for Hematologic/Lymphatic symptoms  Cardiovascular : Negative for cardiovascular symptoms  Respiratory : Negative for respiratory symptoms  Endocrine: Negative for endocrine symptoms  Musculoskeletal: Negative for musculoskeletal symptoms  Neurological: Negative for neurological symptoms  Psychologic: Negative for psychiatric symptoms

## 2020-04-15 NOTE — Patient Instructions (Signed)
Prostate Cancer  The prostate is a male gland that helps make semen. It is located below a man's bladder, in front of the rectum. Prostate cancer is when abnormal cells grow in this gland. What are the causes? The cause of this condition is not known. What increases the risk? You are more likely to develop this condition if:  You are 85 years of age or older.  You are African American.  You have a family history of prostate cancer.  You have a family history of breast cancer. What are the signs or symptoms? Symptoms of this condition include:  A need to pee often.  Peeing that is weak, or pee that stops and starts.  Trouble starting or stopping your pee.  Inability to pee.  Blood in your pee or semen.  Pain in the lower back, lower belly (abdomen), hips, or upper thighs.  Trouble getting an erection.  Trouble emptying all of your pee. How is this treated? Treatment for this condition depends on your age, your health, the kind of treatment you like, and how far the cancer has spread. Treatments include:  Being watched. This is called observation. You will be tested from time to time, but you will not get treated. Tests are to make sure that the cancer is not growing.  Surgery. This may be done to remove the prostate, to remove the testicles, or to freeze or kill cancer cells.  Radiation. This uses a strong beam to kill cancer cells.  Ultrasound energy. This uses strong sound waves to kill cancer cells.  Chemotherapy. This uses medicines that stop cancer cells from increasing. This kills cancer cells and healthy cells.  Targeted therapy. This kills cancer cells only. Healthy cells are not affected.  Hormone treatment. This stops the body from making hormones that help the cancer cells to grow. Follow these instructions at home:  Take over-the-counter and prescription medicines only as told by your doctor.  Eat a healthy diet.  Get plenty of sleep.  Ask your  doctor for help to find a support group for men with prostate cancer.  If you have to go to the hospital, let your cancer doctor (oncologist) know.  Treatment may affect your ability to have sex. Touch, hold, hug, and caress your partner to have intimate moments.  Keep all follow-up visits as told by your doctor. This is important. Contact a doctor if:  You have new or more trouble peeing.  You have new or more blood in your pee.  You have new or more pain in your hips, back, or chest. Get help right away if:  You have weakness in your legs.  You lose feeling in your legs.  You cannot control your pee or your poop (stool).  You have chills or a fever. Summary  The prostate is a male gland that helps make semen.  Prostate cancer is when abnormal cells grow in this gland.  Treatment includes doing surgery, using medicines, using very strong beams, or watching without treatment.  Ask your doctor for help to find a support group for men with prostate cancer.  Contact a doctor if you have problems peeing or have any new pain that you did not have before. This information is not intended to replace advice given to you by your health care provider. Make sure you discuss any questions you have with your health care provider. Document Revised: 02/11/2019 Document Reviewed: 02/11/2019 Elsevier Patient Education  2021 Elsevier Inc.  

## 2020-04-15 NOTE — Progress Notes (Signed)
04/15/2020 2:12 PM   Barnie Alderman 14-Oct-1933 [id number]  Referring provider: Neale Burly, MD [address] [place],  [place] [address]  followup prostate cancer  HPI: Mr Ferrari is a 87yo here for followup for metastatic prostate cancer. PSA increased to 0.6 from 0.4 3 months ago. Mild hot flashes no bone pain. NO significant LUTS. No hematuria.Marland Kitchen He is due to ADT today   PMH: Past Medical History:  Diagnosis Date  . COPD (chronic obstructive pulmonary disease) ([place])   . Emphysema   . History of prostate cancer   . Hypoxemia   . Pulmonary hypertension ([place])   . PVC (premature ventricular contraction)     Surgical History: Past Surgical History:  Procedure Laterality Date  . CYSTECTOMY    . PROSTATECTOMY      Home Medications:  Allergies as of 04/15/2020      Reactions   Neomycin-bacitracin Zn-polymyx    REACTION: rash      Medication List       Accurate as of April 15, 2020  2:12 PM. If you have any questions, ask your nurse or doctor.        albuterol (2.5 MG/3ML) 0.083% nebulizer solution Commonly known as: PROVENTIL Take 2.5 mg by nebulization every 4 (four) hours as needed.   aspirin 81 MG tablet Take 81 mg by mouth daily.   atenolol 50 MG tablet Commonly known as: TENORMIN Take 50 mg by mouth daily.   atorvastatin 20 MG tablet Commonly known as: LIPITOR Take 20 mg by mouth at bedtime.   famotidine 20 MG tablet Commonly known as: PEPCID Take 20 mg by mouth 2 (two) times daily.   Fluticasone-Salmeterol 250-50 MCG/DOSE Aepb Commonly known as: ADVAIR Inhale 1 puff into the lungs every 12 (twelve) hours.   glimepiride 1 MG tablet Commonly known as: AMARYL Take 1 mg by mouth daily before breakfast.   metFORMIN 500 MG tablet Commonly known as: GLUCOPHAGE Take 500 mg by mouth 2 (two) times daily.   omeprazole 20 MG capsule Commonly known as: PRILOSEC Take 40 mg by mouth daily.   OXYGEN Inhale 3.5 L into the lungs.   sodium  chloride (hypertonic) 3 % solution SMARTSIG:3 Milliliter(s) Via Nebulizer Twice Daily PRN   telmisartan-hydrochlorothiazide 80-25 MG tablet Commonly known as: MICARDIS HCT Take 1 tablet by mouth daily.   tiotropium 18 MCG inhalation capsule Commonly known as: SPIRIVA Place 18 mcg into inhaler and inhale daily.   Vitamin D-3 125 MCG (5000 UT) Tabs Take 1 tablet by mouth daily.       Allergies:  Allergies  Allergen Reactions  . Neomycin-Bacitracin Zn-Polymyx     REACTION: rash    Family History: Family History  Problem Relation Age of Onset  . Sarcoidosis Brother   . Heart disease Father   . Rheum arthritis Sister   . Rheum arthritis Sister   . Rheum arthritis Father   . Cancer Mother     Social History:  reports that he quit smoking about 38 years ago. His smoking use included cigarettes. He has a 20.00 pack-year smoking history. He has never used smokeless tobacco. He reports that he does not drink alcohol and does not use drugs.  ROS: All other review of systems were reviewed and are negative except what is noted above in HPI  Physical Exam: BP 132/60   Pulse 68   Temp 98.5 F (36.9 C)   Ht 6' 1.5" (1.867 m)   Wt 228 lb (103.4 kg)  BMI 29.67 kg/m   Constitutional:  Alert and oriented, No acute distress. HEENT: [place] AT, moist mucus membranes.  Trachea midline, no masses. Cardiovascular: No clubbing, cyanosis, or edema. Respiratory: Normal respiratory effort, no increased work of breathing. GI: Abdomen is soft, nontender, nondistended, no abdominal masses GU: No CVA tenderness.  Lymph: No cervical or inguinal lymphadenopathy. Skin: No rashes, bruises or suspicious lesions. Neurologic: Grossly intact, no focal deficits, moving all 4 extremities. Psychiatric: Normal mood and affect.  Laboratory Data: Lab Results  Component Value Date   WBC 7.2 10/23/2006   HGB 9.7 (L) 10/23/2006   HCT 29.7 (L) 10/23/2006   MCV 88.3 10/23/2006   PLT 353 10/23/2006     Lab Results  Component Value Date   CREATININE 1.58 (H) 10/20/2006    Lab Results  Component Value Date   PSA 0.3 09/30/2019   PSA 0.3 06/24/2019   PSA 0.4 03/26/2019    No results found for: TESTOSTERONE  No results found for: HGBA1C  Urinalysis    Component Value Date/Time   COLORURINE YELLOW 10/14/2006 1821   APPEARANCEUR Clear 01/14/2020 1408   LABSPEC 1.010 10/14/2006 1821   PHURINE 5.5 10/14/2006 1821   GLUCOSEU Negative 01/14/2020 1408   HGBUR TRACE (A) 10/14/2006 1821   BILIRUBINUR Negative 01/14/2020 [place] 10/14/2006 1821   PROTEINUR Negative 01/14/2020 1408   PROTEINUR NEGATIVE 10/14/2006 1821   UROBILINOGEN 0.2 10/14/2006 1821   NITRITE Negative 01/14/2020 1408   NITRITE NEGATIVE 10/14/2006 1821   LEUKOCYTESUR Negative 01/14/2020 1408    Lab Results  Component Value Date   LABMICR Comment 01/14/2020   BACTERIA FEW (A) 10/14/2006    Pertinent Imaging:  No results found for this or any previous visit.  No results found for this or any previous visit.  No results found for this or any previous visit.  No results found for this or any previous visit.  No results found for this or any previous visit.  No results found for this or any previous visit.  No results found for this or any previous visit.  No results found for this or any previous visit.   Assessment & Plan:    1. Prostate cancer ([place]) -continue lupron 22.5mg  (7.5mg  monthly) -RCT 3 months with PSA - Urinalysis, Routine w reflex microscopic   No follow-ups on file.  Nicolette Bang, MD  [facility] [place]

## 2020-04-16 ENCOUNTER — Ambulatory Visit: Payer: Medicare Other | Admitting: Urology

## 2020-05-05 DIAGNOSIS — J449 Chronic obstructive pulmonary disease, unspecified: Secondary | ICD-10-CM | POA: Diagnosis not present

## 2020-05-05 DIAGNOSIS — I10 Essential (primary) hypertension: Secondary | ICD-10-CM | POA: Diagnosis not present

## 2020-05-05 DIAGNOSIS — E1169 Type 2 diabetes mellitus with other specified complication: Secondary | ICD-10-CM | POA: Diagnosis not present

## 2020-05-05 DIAGNOSIS — Z6829 Body mass index (BMI) 29.0-29.9, adult: Secondary | ICD-10-CM | POA: Diagnosis not present

## 2020-07-09 ENCOUNTER — Other Ambulatory Visit: Payer: Self-pay

## 2020-07-09 ENCOUNTER — Other Ambulatory Visit: Payer: Medicare Other

## 2020-07-09 DIAGNOSIS — C61 Malignant neoplasm of prostate: Secondary | ICD-10-CM

## 2020-07-10 LAB — PSA: Prostate Specific Ag, Serum: 0.6 ng/mL (ref 0.0–4.0)

## 2020-07-16 ENCOUNTER — Ambulatory Visit: Payer: Medicare Other | Admitting: Urology

## 2020-07-16 ENCOUNTER — Other Ambulatory Visit: Payer: Self-pay

## 2020-07-16 ENCOUNTER — Encounter: Payer: Self-pay | Admitting: Urology

## 2020-07-16 VITALS — BP 143/64 | HR 76 | Temp 99.1°F | Ht 72.5 in | Wt 228.0 lb

## 2020-07-16 DIAGNOSIS — C61 Malignant neoplasm of prostate: Secondary | ICD-10-CM

## 2020-07-16 MED ORDER — LEUPROLIDE ACETATE (3 MONTH) 22.5 MG IM KIT
22.5000 mg | PACK | Freq: Once | INTRAMUSCULAR | Status: AC
  Administered 2020-07-16: 22.5 mg via INTRAMUSCULAR

## 2020-07-16 NOTE — Progress Notes (Signed)
07/16/2020 3:17 PM   Danny Martinez [birth year] [id number]  Referring provider: Neale Burly, MD [address] [place],  [place] [address]  Metastatic prostate cancer  HPI: Danny Martinez is a 87yo here for followup for metastatic prostate cancer. PSA is stable at 0.6 and ADT. No worsening LUTS. No urinary incontinence.   PMH: Past Medical History:  Diagnosis Date  . COPD (chronic obstructive pulmonary disease) ([place])   . Emphysema   . History of prostate cancer   . Hypoxemia   . Pulmonary hypertension ([place])   . PVC (premature ventricular contraction)     Surgical History: Past Surgical History:  Procedure Laterality Date  . CYSTECTOMY    . PROSTATECTOMY      Home Medications:  Allergies as of 07/16/2020      Reactions   Neomycin-bacitracin Zn-polymyx    REACTION: rash      Medication List       Accurate as of Jul 16, 2020  3:17 PM. If you have any questions, ask your nurse or doctor.        STOP taking these medications   atorvastatin 20 MG tablet Commonly known as: LIPITOR Stopped by: Danny Bang, MD   famotidine 20 MG tablet Commonly known as: PEPCID Stopped by: Danny Bang, MD   metFORMIN 500 MG tablet Commonly known as: GLUCOPHAGE Stopped by: Danny Bang, MD   omeprazole 20 MG capsule Commonly known as: PRILOSEC Stopped by: Danny Bang, MD     TAKE these medications   albuterol (2.5 MG/3ML) 0.083% nebulizer solution Commonly known as: PROVENTIL Take 2.5 mg by nebulization every 4 (four) hours as needed.   aspirin 81 MG tablet Take 81 mg by mouth daily.   atenolol 50 MG tablet Commonly known as: TENORMIN Take 50 mg by mouth daily.   Fluticasone-Salmeterol 250-50 MCG/DOSE Aepb Commonly known as: ADVAIR Inhale 1 puff into the lungs every 12 (twelve) hours.   glimepiride 1 MG tablet Commonly known as: AMARYL Take 1 mg by mouth daily before breakfast.   OXYGEN Inhale 3.5 L into the lungs.   sodium chloride  (hypertonic) 3 % solution SMARTSIG:3 Milliliter(s) Via Nebulizer Twice Daily PRN   telmisartan-hydrochlorothiazide 80-25 MG tablet Commonly known as: MICARDIS HCT Take 1 tablet by mouth daily.   tiotropium 18 MCG inhalation capsule Commonly known as: SPIRIVA Place 18 mcg into inhaler and inhale daily.   Vitamin D-3 125 MCG (5000 UT) Tabs Take 1 tablet by mouth daily.       Allergies:  Allergies  Allergen Reactions  . Neomycin-Bacitracin Zn-Polymyx     REACTION: rash    Family History: Family History  Problem Relation Age of Onset  . Sarcoidosis Brother   . Heart disease Father   . Rheum arthritis Sister   . Rheum arthritis Sister   . Rheum arthritis Father   . Cancer Mother     Social History:  reports that he quit smoking about 38 years ago. His smoking use included cigarettes. He has a 20.00 pack-year smoking history. He has never used smokeless tobacco. He reports that he does not drink alcohol and does not use drugs.  ROS: All other review of systems were reviewed and are negative except what is noted above in HPI  Physical Exam: BP (!) 143/64   Pulse 76   Temp 99.1 F (37.3 C)   Ht 6' 0.5" (1.842 m)   Wt 228 lb (103.4 kg)   BMI 30.50 kg/m   Constitutional:  Alert  and oriented, No acute distress. HEENT: [place] AT, moist mucus membranes.  Trachea midline, no masses. Cardiovascular: No clubbing, cyanosis, or edema. Respiratory: Normal respiratory effort, no increased work of breathing. GI: Abdomen is soft, nontender, nondistended, no abdominal masses GU: No CVA tenderness.  Lymph: No cervical or inguinal lymphadenopathy. Skin: No rashes, bruises or suspicious lesions. Neurologic: Grossly intact, no focal deficits, moving all 4 extremities. Psychiatric: Normal mood and affect.  Laboratory Data: Lab Results  Component Value Date   WBC 7.2 10/23/2006   HGB 9.7 (L) 10/23/2006   HCT 29.7 (L) 10/23/2006   MCV 88.3 10/23/2006   PLT 353 10/23/2006    Lab  Results  Component Value Date   CREATININE 1.58 (H) 10/20/2006    Lab Results  Component Value Date   PSA 0.3 09/30/2019   PSA 0.3 06/24/2019   PSA 0.4 03/26/2019    No results found for: TESTOSTERONE  No results found for: HGBA1C  Urinalysis    Component Value Date/Time   COLORURINE YELLOW 10/14/2006 1821   APPEARANCEUR Clear 04/15/2020 1358   LABSPEC 1.010 10/14/2006 1821   PHURINE 5.5 10/14/2006 1821   GLUCOSEU Negative 04/15/2020 1358   HGBUR TRACE (A) 10/14/2006 1821   BILIRUBINUR Negative 04/15/2020 1358   [place] 10/14/2006 1821   PROTEINUR Negative 04/15/2020 1358   PROTEINUR NEGATIVE 10/14/2006 1821   UROBILINOGEN 0.2 10/14/2006 1821   NITRITE Negative 04/15/2020 1358   NITRITE NEGATIVE 10/14/2006 1821   LEUKOCYTESUR Trace (A) 04/15/2020 1358    Lab Results  Component Value Date   LABMICR See below: 04/15/2020   WBCUA 0-5 04/15/2020   LABEPIT 0-10 04/15/2020   BACTERIA None seen 04/15/2020    Pertinent Imaging:  No results found for this or any previous visit.  No results found for this or any previous visit.  No results found for this or any previous visit.  No results found for this or any previous visit.  No results found for this or any previous visit.  No results found for this or any previous visit.  No results found for this or any previous visit.  No results found for this or any previous visit.   Assessment & Plan:    1. Prostate cancer ([place]) -Lupron 22.5mg  today (7.5mg  monthly) -RTC 3 months with PSA and for lupron - Urinalysis, Routine w reflex microscopic - leuprolide (LUPRON) injection 22.5 mg   No follow-ups on file.  Danny Bang, MD  [facility] [place]

## 2020-07-16 NOTE — Progress Notes (Signed)
Lupron IM Injection   Due to Prostate Cancer patient is present today for a Lupron Injection.  Medication: Lupron 3 month Dose: 22 mg  Location: left upper outer buttocks Lot: [id number] Exp: 05/28/2022  Patient tolerated well, no complications were noted  Performed by: Rashawn Rolon, LPN  Follow up: Per MD note  Urological Symptom Review  Patient is experiencing the following symptoms: Frequent urination Get up at night to urinate Urinary tract infection   Review of Systems  Gastrointestinal (upper)  : Indigestion/heartburn  Gastrointestinal (lower) : Constipation  Constitutional : Negative for symptoms  Skin: Negative for skin symptoms  Eyes: Negative for eye symptoms  Ear/Nose/Throat : Sinus problems  Hematologic/Lymphatic: Negative for Hematologic/Lymphatic symptoms  Cardiovascular : Negative for cardiovascular symptoms  Respiratory : Shortness of breath  Endocrine: Negative for endocrine symptoms  Musculoskeletal: Negative for musculoskeletal symptoms  Neurological: Negative for neurological symptoms  Psychologic: Negative for psychiatric symptoms

## 2020-07-16 NOTE — Patient Instructions (Signed)
Prostate Cancer  The prostate is a small gland (1.5 inches [3.8 cm] wide and 1 inch [2.5 cm] high) that is involved in the production of semen. It is located below a man's bladder, in front of the rectum. Prostate cancer is the abnormal growth of cells in the prostate gland. What are the causes? The exact cause of this condition is not known. What increases the risk? You are more likely to develop this condition if:  You are 85 years of age or older.  You are African American.  You have a family history of prostate cancer.  You have a family history of breast cancer. What are the signs or symptoms? Symptoms of this condition include:  A need to urinate often.  Weak or interrupted flow of urine.  Trouble starting or stopping urination.  Inability to urinate.  Blood in urine or semen.  Persistent pain or discomfort in the lower back, lower abdomen, hips, or upper thighs.  Trouble getting an erection.  Trouble emptying the bladder all the way. How is this diagnosed? This condition can be diagnosed with:  A digital rectal exam. For this exam, a health care provider inserts a gloved finger into the rectum to feel the prostate gland.  A blood test called a prostate-specific antigen (PSA) test.  A procedure in which a sample of tissue is taken from the prostate and checked under a microscope (prostate biopsy).  An imaging test called transrectal ultrasonography. Once the condition is diagnosed, tests will be done to determine how far the cancer has spread. This is called staging the cancer. Staging may involve imaging tests, such as:  A bone scan.  A CT scan.  A PET scan.  An MRI. The stages of prostate cancer are as follows:  Stage I. At this stage, the cancer is found in the prostate only. The cancer is not visible on imaging tests, and it is usually found by accident, such as during prostate surgery.  Stage II. At this stage, the cancer is more advanced than it is  in stage I, but the cancer has not spread outside the prostate.  Stage III. At this stage, the cancer has spread beyond the outer layer of the prostate to nearby tissues. The cancer may be found in the seminal vesicles, which are near the bladder and the prostate.  Stage IV. At this stage, the cancer has spread to other parts of the body, such as the lymph nodes, bones, bladder, rectum, liver, or lungs. How is this treated? Treatment for this condition depends on several factors, including the stage of the cancer, your age, personal preferences, and your overall health. Talk with your health care provider about treatment options that are recommended for you. Common treatments include:  Observation for early stage prostate cancer (active surveillance). This involves having exams, blood tests, and in some cases, more biopsies. For some men, this is the only treatment needed.  Surgery. Types of surgeries include: ? Open surgery (prostatectomy). In this surgery, a larger incision is made to remove the prostate. ? A laparoscopic prostatectomy. This is a surgery to remove the prostate and lymph nodes through several, small incisions. It is often referred to as a minimally invasive surgery. ? A robotic prostatectomy. This is laparoscopic surgery to remove the prostate and lymph nodes with the help of robotic arms that are controlled by the surgeon. ? Orchiectomy. This is surgery to remove the testicles. ? Cryosurgery. This is surgery to freeze and destroy cancer cells.  Radiation treatment. Types of radiation treatment include: ? External beam radiation. This type aims beams of radiation from outside the body at the prostate to destroy cancerous cells. ? Brachytherapy. This type uses radioactive needles, seeds, wires, or tubes that are implanted into the prostate gland. Like external beam radiation, brachytherapy destroys cancerous cells. An advantage is that this type of radiation limits the damage to  surrounding tissue and has fewer side effects.  High-intensity, focused ultrasonography. This treatment destroys cancer cells by delivering high-energy ultrasound waves to the cancerous cells.  Chemotherapy medicines. This treatment kills cancer cells or stops them from multiplying. It kills both cancer cells and normal cells.  Targeted therapy. This treatment uses medicines to kill cancer cells without damaging normal cells.  Hormone treatment. This treatment involves taking medicines that act on one of the male hormones (testosterone): ? By stopping your body from producing testosterone. ? By blocking testosterone from reaching cancer cells. Follow these instructions at home:  Take over-the-counter and prescription medicines only as told by your health care provider.  Maintain a healthy diet.  Get plenty of sleep.  Consider joining a support group for men who have prostate cancer. Meeting with a support group may help you learn to manage the stress of having cancer.  If you have to go to the hospital, notify your cancer specialist (oncologist).  Treatment for prostate cancer may affect sexual function. Continue to have intimate moments with your partner. This may include touching, holding, hugging, and caressing.  Keep all follow-up visits as told by your health care provider. This is important. Contact a health care provider if:  You have new or increasing trouble urinating.  You have new or increasing blood in your urine.  You have new or increasing pain in your hips, back, or chest. Get help right away if:  You have weakness or numbness in your legs.  You cannot control urination or your bowel movements (incontinence).  You have chills or a fever. Summary  The prostate is a small gland that is involved in the production of semen. It is located below a man's bladder, in front of the rectum.  Prostate cancer is the abnormal growth of cells in the prostate  gland.  Treatment for this condition depends on the stage of the cancer, your age, personal preferences, and your overall health. Talk with your health care provider about treatment options that are recommended for you.  Consider joining a support group for men who have prostate cancer. Meeting with a support group may help you learn to cope with the stress of having cancer. This information is not intended to replace advice given to you by your health care provider. Make sure you discuss any questions you have with your health care provider. Document Revised: 02/11/2019 Document Reviewed: 02/11/2019 Elsevier Patient Education  2021 [employer].

## 2020-07-28 DIAGNOSIS — Z6829 Body mass index (BMI) 29.0-29.9, adult: Secondary | ICD-10-CM | POA: Diagnosis not present

## 2020-07-28 DIAGNOSIS — E1169 Type 2 diabetes mellitus with other specified complication: Secondary | ICD-10-CM | POA: Diagnosis not present

## 2020-07-28 DIAGNOSIS — I10 Essential (primary) hypertension: Secondary | ICD-10-CM | POA: Diagnosis not present

## 2020-07-28 DIAGNOSIS — J449 Chronic obstructive pulmonary disease, unspecified: Secondary | ICD-10-CM | POA: Diagnosis not present

## 2020-09-06 DIAGNOSIS — J449 Chronic obstructive pulmonary disease, unspecified: Secondary | ICD-10-CM | POA: Diagnosis not present

## 2020-09-28 DIAGNOSIS — Z6829 Body mass index (BMI) 29.0-29.9, adult: Secondary | ICD-10-CM | POA: Diagnosis not present

## 2020-09-28 DIAGNOSIS — J0100 Acute maxillary sinusitis, unspecified: Secondary | ICD-10-CM | POA: Diagnosis not present

## 2020-10-06 ENCOUNTER — Other Ambulatory Visit: Payer: Medicare Other

## 2020-10-06 ENCOUNTER — Other Ambulatory Visit: Payer: Self-pay

## 2020-10-06 DIAGNOSIS — C61 Malignant neoplasm of prostate: Secondary | ICD-10-CM

## 2020-10-07 LAB — PSA: Prostate Specific Ag, Serum: 1 ng/mL (ref 0.0–4.0)

## 2020-10-13 ENCOUNTER — Ambulatory Visit (INDEPENDENT_AMBULATORY_CARE_PROVIDER_SITE_OTHER): Payer: Medicare Other | Admitting: Urology

## 2020-10-13 ENCOUNTER — Encounter: Payer: Self-pay | Admitting: Urology

## 2020-10-13 ENCOUNTER — Other Ambulatory Visit: Payer: Self-pay

## 2020-10-13 VITALS — BP 159/69 | HR 76

## 2020-10-13 DIAGNOSIS — C61 Malignant neoplasm of prostate: Secondary | ICD-10-CM

## 2020-10-13 MED ORDER — LEUPROLIDE ACETATE (3 MONTH) 22.5 MG IM KIT
22.5000 mg | PACK | Freq: Once | INTRAMUSCULAR | Status: AC
  Administered 2020-10-13: 22.5 mg via INTRAMUSCULAR

## 2020-10-13 MED ORDER — LEUPROLIDE ACETATE (4 MONTH) 30 MG IM KIT: 30.0000 mg | PACK | Freq: Once | INTRAMUSCULAR | Status: DC

## 2020-10-13 NOTE — Patient Instructions (Signed)
Prostate Cancer  The prostate is a small gland (1.5 inches [3.8 cm] wide and 1 inch [2.5 cm] high) that is involved in the production of semen. It is located below a man's bladder, in front of the rectum. Prostate cancer is the abnormal growth ofcells in the prostate gland. What are the causes? The exact cause of this condition is not known. What increases the risk? You are more likely to develop this condition if: You are 85 years of age or older. You are African American. You have a family history of prostate cancer. You have a family history of breast cancer. What are the signs or symptoms? Symptoms of this condition include: A need to urinate often. Weak or interrupted flow of urine. Trouble starting or stopping urination. Inability to urinate. Blood in urine or semen. Persistent pain or discomfort in the lower back, lower abdomen, hips, or upper thighs. Trouble getting an erection. Trouble emptying the bladder all the way. How is this diagnosed? This condition can be diagnosed with: A digital rectal exam. For this exam, a health care provider inserts a gloved finger into the rectum to feel the prostate gland. A blood test called a prostate-specific antigen (PSA) test. A procedure in which a sample of tissue is taken from the prostate and checked under a microscope (prostate biopsy). An imaging test called transrectal ultrasonography. Once the condition is diagnosed, tests will be done to determine how far the cancer has spread. This is called staging the cancer. Staging may involve imaging tests, such as: A bone scan. A CT scan. A PET scan. An MRI. The stages of prostate cancer are as follows: Stage I. At this stage, the cancer is found in the prostate only. The cancer is not visible on imaging tests, and it is usually found by accident, such as during prostate surgery. Stage II. At this stage, the cancer is more advanced than it is in stage I, but the cancer has not spread  outside the prostate. Stage III. At this stage, the cancer has spread beyond the outer layer of the prostate to nearby tissues. The cancer may be found in the seminal vesicles, which are near the bladder and the prostate. Stage IV. At this stage, the cancer has spread to other parts of the body, such as the lymph nodes, bones, bladder, rectum, liver, or lungs. How is this treated? Treatment for this condition depends on several factors, including the stage of the cancer, your age, personal preferences, and your overall health. Talk with your health care provider about treatment options that are recommended for you. Common treatments include: Observation for early stage prostate cancer (active surveillance). This involves having exams, blood tests, and in some cases, more biopsies. For some men, this is the only treatment needed. Surgery. Types of surgeries include: Open surgery (prostatectomy). In this surgery, a larger incision is made to remove the prostate. A laparoscopic prostatectomy. This is a surgery to remove the prostate and lymph nodes through several, small incisions. It is often referred to as a minimally invasive surgery. A robotic prostatectomy. This is laparoscopic surgery to remove the prostate and lymph nodes with the help of robotic arms that are controlled by the surgeon. Orchiectomy. This is surgery to remove the testicles. Cryosurgery. This is surgery to freeze and destroy cancer cells. Radiation treatment. Types of radiation treatment include: External beam radiation. This type aims beams of radiation from outside the body at the prostate to destroy cancerous cells. Brachytherapy. This type uses radioactive needles,   seeds, wires, or tubes that are implanted into the prostate gland. Like external beam radiation, brachytherapy destroys cancerous cells. An advantage is that this type of radiation limits the damage to surrounding tissue and has fewer side effects. High-intensity,  focused ultrasonography. This treatment destroys cancer cells by delivering high-energy ultrasound waves to the cancerous cells. Chemotherapy medicines. This treatment kills cancer cells or stops them from multiplying. It kills both cancer cells and normal cells. Targeted therapy. This treatment uses medicines to kill cancer cells without damaging normal cells. Hormone treatment. This treatment involves taking medicines that act on one of the male hormones (testosterone): By stopping your body from producing testosterone. By blocking testosterone from reaching cancer cells. Follow these instructions at home: Take over-the-counter and prescription medicines only as told by your health care provider. Maintain a healthy diet. Get plenty of sleep. Consider joining a support group for men who have prostate cancer. Meeting with a support group may help you learn to manage the stress of having cancer. If you have to go to the hospital, notify your cancer specialist (oncologist). Treatment for prostate cancer may affect sexual function. Continue to have intimate moments with your partner. This may include touching, holding, hugging, and caressing. Keep all follow-up visits as told by your health care provider. This is important. Contact a health care provider if: You have new or increasing trouble urinating. You have new or increasing blood in your urine. You have new or increasing pain in your hips, back, or chest. Get help right away if: You have weakness or numbness in your legs. You cannot control urination or your bowel movements (incontinence). You have chills or a fever. Summary The prostate is a small gland that is involved in the production of semen. It is located below a man's bladder, in front of the rectum. Prostate cancer is the abnormal growth of cells in the prostate gland. Treatment for this condition depends on the stage of the cancer, your age, personal preferences, and your  overall health. Talk with your health care provider about treatment options that are recommended for you. Consider joining a support group for men who have prostate cancer. Meeting with a support group may help you learn to cope with the stress of having cancer. This information is not intended to replace advice given to you by your health care provider. Make sure you discuss any questions you have with your healthcare provider. Document Revised: 02/11/2019 Document Reviewed: 02/11/2019 Elsevier Patient Education  2022 Elsevier Inc.  

## 2020-10-13 NOTE — Progress Notes (Signed)
10/13/2020 3:14 PM   Barnie Alderman 25-Oct-1933 [id number]  Referring provider: Neale Burly, MD [address] [place],  [place] [phone]  Followup prostate cancer   HPI: Mr Padley is a 87yo here for followup for castrate resistant prostate cancer. PSA increased to 1.0 from 0.6 three months ago. No new bone pain. He has stable LUTS. He remains on eligard.    PMH: Past Medical History:  Diagnosis Date   COPD (chronic obstructive pulmonary disease) ([place])    Emphysema    History of prostate cancer    Hypoxemia    Pulmonary hypertension ([facility])    PVC (premature ventricular contraction)     Surgical History: Past Surgical History:  Procedure Laterality Date   CYSTECTOMY     PROSTATECTOMY      Home Medications:  Allergies as of 10/13/2020       Reactions   Neomycin-bacitracin Zn-polymyx    REACTION: rash        Medication List        Accurate as of October 13, 2020  3:14 PM. If you have any questions, ask your nurse or doctor.          albuterol (2.5 MG/3ML) 0.083% nebulizer solution Commonly known as: PROVENTIL Take 2.5 mg by nebulization every 4 (four) hours as needed.   aspirin 81 MG tablet Take 81 mg by mouth daily.   atenolol 50 MG tablet Commonly known as: TENORMIN Take 50 mg by mouth daily.   docusate sodium 100 MG capsule Commonly known as: COLACE Take 100 mg by mouth 2 (two) times daily.   Fluticasone-Salmeterol 250-50 MCG/DOSE Aepb Commonly known as: ADVAIR Inhale 1 puff into the lungs every 12 (twelve) hours.   glimepiride 1 MG tablet Commonly known as: AMARYL Take 1 mg by mouth daily before breakfast.   HM Senna 8.6 MG tablet Generic drug: senna Take 2 tablets by mouth 2 (two) times daily.   omeprazole 40 MG capsule Commonly known as: PRILOSEC Take 40 mg by mouth daily.   OXYGEN Inhale 3.5 L into the lungs.   sodium chloride (hypertonic) 3 % solution SMARTSIG:3 Milliliter(s) Via Nebulizer Twice Daily PRN    telmisartan-hydrochlorothiazide 80-25 MG tablet Commonly known as: MICARDIS HCT Take 1 tablet by mouth daily.   tiotropium 18 MCG inhalation capsule Commonly known as: SPIRIVA Place 18 mcg into inhaler and inhale daily.   Vitamin D-3 125 MCG (5000 UT) Tabs Take 1 tablet by mouth daily.        Allergies:  Allergies  Allergen Reactions   Neomycin-Bacitracin Zn-Polymyx     REACTION: rash    Family History: Family History  Problem Relation Age of Onset   Sarcoidosis Brother    Heart disease Father    Rheum arthritis Sister    Rheum arthritis Sister    Rheum arthritis Father    Cancer Mother     Social History:  reports that he quit smoking about 38 years ago. His smoking use included cigarettes. He has a 20.00 pack-year smoking history. He has never used smokeless tobacco. He reports that he does not drink alcohol and does not use drugs.  ROS: All other review of systems were reviewed and are negative except what is noted above in HPI  Physical Exam: BP (!) 159/69   Pulse 76   Constitutional:  Alert and oriented, No acute distress. HEENT: [place] AT, moist mucus membranes.  Trachea midline, no masses. Cardiovascular: No clubbing, cyanosis, or edema. Respiratory: Normal respiratory effort,  no increased work of breathing. GI: Abdomen is soft, nontender, nondistended, no abdominal masses GU: No CVA tenderness.  Lymph: No cervical or inguinal lymphadenopathy. Skin: No rashes, bruises or suspicious lesions. Neurologic: Grossly intact, no focal deficits, moving all 4 extremities. Psychiatric: Normal mood and affect.  Laboratory Data: Lab Results  Component Value Date   WBC 7.2 10/23/2006   HGB 9.7 (L) 10/23/2006   HCT 29.7 (L) 10/23/2006   MCV 88.3 10/23/2006   PLT 353 10/23/2006    Lab Results  Component Value Date   CREATININE 1.58 (H) 10/20/2006    Lab Results  Component Value Date   PSA 0.3 09/30/2019   PSA 0.3 06/24/2019   PSA 0.4 03/26/2019    No  results found for: TESTOSTERONE  No results found for: HGBA1C  Urinalysis    Component Value Date/Time   COLORURINE YELLOW 10/14/2006 1821   APPEARANCEUR Clear 04/15/2020 1358   LABSPEC 1.010 10/14/2006 1821   PHURINE 5.5 10/14/2006 1821   GLUCOSEU Negative 04/15/2020 1358   HGBUR TRACE (A) 10/14/2006 1821   BILIRUBINUR Negative 04/15/2020 1358   [place] 10/14/2006 1821   PROTEINUR Negative 04/15/2020 1358   PROTEINUR NEGATIVE 10/14/2006 1821   UROBILINOGEN 0.2 10/14/2006 1821   NITRITE Negative 04/15/2020 1358   NITRITE NEGATIVE 10/14/2006 1821   LEUKOCYTESUR Trace (A) 04/15/2020 1358    Lab Results  Component Value Date   LABMICR See below: 04/15/2020   WBCUA 0-5 04/15/2020   LABEPIT 0-10 04/15/2020   BACTERIA None seen 04/15/2020    Pertinent Imaging:  No results found for this or any previous visit.  No results found for this or any previous visit.  No results found for this or any previous visit.  No results found for this or any previous visit.  No results found for this or any previous visit.  No results found for this or any previous visit.  No results found for this or any previous visit.  No results found for this or any previous visit.   Assessment & Plan:    1. Prostate cancer ([place]) -BMp, CT and bone scan - leuprolide (LUPRON) injection 30 mg - Urinalysis, Routine w reflex microscopic   No follow-ups on file.  Nicolette Bang, MD  [facility] [place]

## 2020-10-13 NOTE — Progress Notes (Signed)
Urological Symptom Review  Patient is experiencing the following symptoms: Frequent urination Get up at night to urinate   Review of Systems  Gastrointestinal (upper)  : Indigestion/heartburn  Gastrointestinal (lower) : Constipation  Constitutional : Negative for symptoms  Skin: Negative for skin symptoms  Eyes: Negative for eye symptoms  Ear/Nose/Throat : Sinus problems  Hematologic/Lymphatic: Negative for Hematologic/Lymphatic symptoms  Cardiovascular : Negative for cardiovascular symptoms  Respiratory : Cough Shortness of breath  Endocrine: Negative for endocrine symptoms  Musculoskeletal: Negative for musculoskeletal symptoms  Neurological: Negative for neurological symptoms  Psychologic: Negative for psychiatric symptoms

## 2020-10-28 DIAGNOSIS — I10 Essential (primary) hypertension: Secondary | ICD-10-CM | POA: Diagnosis not present

## 2020-10-28 DIAGNOSIS — J440 Chronic obstructive pulmonary disease with acute lower respiratory infection: Secondary | ICD-10-CM | POA: Diagnosis not present

## 2020-10-28 DIAGNOSIS — E1169 Type 2 diabetes mellitus with other specified complication: Secondary | ICD-10-CM | POA: Diagnosis not present

## 2020-10-28 DIAGNOSIS — K219 Gastro-esophageal reflux disease without esophagitis: Secondary | ICD-10-CM | POA: Diagnosis not present

## 2020-10-29 ENCOUNTER — Ambulatory Visit (HOSPITAL_COMMUNITY)
Admission: RE | Admit: 2020-10-29 | Discharge: 2020-10-29 | Disposition: A | Discharge status: Home / Self Care | Payer: Medicare Other | Source: Ambulatory Visit | Attending: Urology | Admitting: Urology

## 2020-10-29 ENCOUNTER — Other Ambulatory Visit: Payer: Self-pay

## 2020-10-29 ENCOUNTER — Other Ambulatory Visit: Payer: Self-pay | Admitting: Urology

## 2020-10-29 DIAGNOSIS — Z9889 Other specified postprocedural states: Secondary | ICD-10-CM | POA: Diagnosis not present

## 2020-10-29 DIAGNOSIS — C61 Malignant neoplasm of prostate: Secondary | ICD-10-CM | POA: Insufficient documentation

## 2020-10-29 DIAGNOSIS — N281 Cyst of kidney, acquired: Secondary | ICD-10-CM | POA: Diagnosis not present

## 2020-10-29 DIAGNOSIS — K760 Fatty (change of) liver, not elsewhere classified: Secondary | ICD-10-CM | POA: Diagnosis not present

## 2020-10-29 LAB — POCT I-STAT CREATININE: Creatinine, Ser: 1.1 mg/dL (ref 0.61–1.24)

## 2020-10-29 MED ORDER — TECHNETIUM TC 99M MEDRONATE IV KIT
21.5000 | PACK | Freq: Once | INTRAVENOUS | Status: AC
  Administered 2020-10-29: 21.5 via INTRAVENOUS

## 2020-10-29 MED ORDER — IOHEXOL 350 MG/ML SOLN
100.0000 mL | Freq: Once | INTRAVENOUS | Status: AC | PRN
  Administered 2020-10-29: 100 mL via INTRAVENOUS

## 2020-11-03 ENCOUNTER — Telehealth (INDEPENDENT_AMBULATORY_CARE_PROVIDER_SITE_OTHER): Payer: Medicare Other | Admitting: Urology

## 2020-11-03 ENCOUNTER — Other Ambulatory Visit: Payer: Self-pay

## 2020-11-03 ENCOUNTER — Encounter: Payer: Self-pay | Admitting: Urology

## 2020-11-03 DIAGNOSIS — C61 Malignant neoplasm of prostate: Secondary | ICD-10-CM | POA: Diagnosis not present

## 2020-11-03 NOTE — Progress Notes (Signed)
11/03/2020 10:00 AM   Danny Martinez [birth year] [id number]  Referring provider: Neale Burly, MD [address] [place],  [place] [id number]  Patient location: home Physician location: office I connected with  Danny Martinez on 11/03/20 by a video enabled telemedicine application and verified that I am speaking with the correct person using two identifiers.   I discussed the limitations of evaluation and management by telemedicine. The patient expressed understanding and agreed to proceed.    Followup prostate cancer  HPI: Danny Martinez is a 87yo here for followup for prostate cancer. CT and bone scan showed no evidence of metastatic disease. He denies nay bone pain. No worsening incontinence. No other complaints today.    PMH: Past Medical History:  Diagnosis Date   COPD (chronic obstructive pulmonary disease) ([place])    Emphysema    History of prostate cancer    Hypoxemia    Pulmonary hypertension (HCC)    PVC (premature ventricular contraction)     Surgical History: Past Surgical History:  Procedure Laterality Date   CYSTECTOMY     PROSTATECTOMY      Home Medications:  Allergies as of 11/03/2020       Reactions   Neomycin-bacitracin Zn-polymyx    REACTION: rash        Medication List        Accurate as of November 03, 2020 10:00 AM. If you have any questions, ask your nurse or doctor.          albuterol (2.5 MG/3ML) 0.083% nebulizer solution Commonly known as: PROVENTIL Take 2.5 mg by nebulization every 4 (four) hours as needed.   aspirin 81 MG tablet Take 81 mg by mouth daily.   atenolol 50 MG tablet Commonly known as: TENORMIN Take 50 mg by mouth daily.   docusate sodium 100 MG capsule Commonly known as: COLACE Take 100 mg by mouth 2 (two) times daily.   Fluticasone-Salmeterol 250-50 MCG/DOSE Aepb Commonly known as: ADVAIR Inhale 1 puff into the lungs every 12 (twelve) hours.   glimepiride 1 MG tablet Commonly known as: AMARYL Take 1 mg  by mouth daily before breakfast.   HM Senna 8.6 MG tablet Generic drug: senna Take 2 tablets by mouth 2 (two) times daily.   omeprazole 40 MG capsule Commonly known as: PRILOSEC Take 40 mg by mouth daily.   OXYGEN Inhale 3.5 L into the lungs.   sodium chloride (hypertonic) 3 % solution SMARTSIG:3 Milliliter(s) Via Nebulizer Twice Daily PRN   telmisartan-hydrochlorothiazide 80-25 MG tablet Commonly known as: MICARDIS HCT Take 1 tablet by mouth daily.   tiotropium 18 MCG inhalation capsule Commonly known as: SPIRIVA Place 18 mcg into inhaler and inhale daily.   Vitamin D-3 125 MCG (5000 UT) Tabs Take 1 tablet by mouth daily.        Allergies:  Allergies  Allergen Reactions   Neomycin-Bacitracin Zn-Polymyx     REACTION: rash    Family History: Family History  Problem Relation Age of Onset   Sarcoidosis Brother    Heart disease Father    Rheum arthritis Sister    Rheum arthritis Sister    Rheum arthritis Father    Cancer Mother     Social History:  reports that he quit smoking about 38 years ago. His smoking use included cigarettes. He has a 20.00 pack-year smoking history. He has never used smokeless tobacco. He reports that he does not drink alcohol and does not use drugs.  ROS: All other review of  systems were reviewed and are negative except what is noted above in HPI   Laboratory Data: Lab Results  Component Value Date   WBC 7.2 10/23/2006   HGB 9.7 (L) 10/23/2006   HCT 29.7 (L) 10/23/2006   MCV 88.3 10/23/2006   PLT 353 10/23/2006    Lab Results  Component Value Date   CREATININE 1.10 10/29/2020    Lab Results  Component Value Date   PSA 0.3 09/30/2019   PSA 0.3 06/24/2019   PSA 0.4 03/26/2019    No results found for: TESTOSTERONE  No results found for: HGBA1C  Urinalysis    Component Value Date/Time   COLORURINE YELLOW 10/14/2006 1821   APPEARANCEUR Clear 04/15/2020 1358   LABSPEC 1.010 10/14/2006 1821   PHURINE 5.5 10/14/2006  1821   GLUCOSEU Negative 04/15/2020 1358   HGBUR TRACE (A) 10/14/2006 1821   BILIRUBINUR Negative 04/15/2020 1358   [place] 10/14/2006 1821   PROTEINUR Negative 04/15/2020 1358   PROTEINUR NEGATIVE 10/14/2006 1821   UROBILINOGEN 0.2 10/14/2006 1821   NITRITE Negative 04/15/2020 1358   NITRITE NEGATIVE 10/14/2006 1821   LEUKOCYTESUR Trace (A) 04/15/2020 1358    Lab Results  Component Value Date   LABMICR See below: 04/15/2020   WBCUA 0-5 04/15/2020   LABEPIT 0-10 04/15/2020   BACTERIA None seen 04/15/2020    Pertinent Imaging: CT and bone scan: Images reviewed and discussed with the patient No results found for this or any previous visit.  No results found for this or any previous visit.  No results found for this or any previous visit.  No results found for this or any previous visit.  No results found for this or any previous visit.  No results found for this or any previous visit.  No results found for this or any previous visit.  No results found for this or any previous visit.   Assessment & Plan:    1. Prostate cancer [facility]) I discussed the natural history castrate resistant prostate cancer with the patient and the various treatment options including surveillance with continued ADT, Liane Comber, [place], and slavage pelvic radiation. After discussing the options the patient elects for PSA surveillance with continued ADT. RTC 3 months with PSA.   No follow-ups on file.  Nicolette Bang, MD  [facility] [place]

## 2020-11-03 NOTE — Patient Instructions (Signed)
Prostate Cancer  The prostate is a male gland that helps make semen. It is located below a man's bladder, in front of the rectum. Prostate cancer is when abnormal cells grow inthis gland. What are the causes? The cause of this condition is not known. What increases the risk? You are more likely to develop this condition if: You are 85 years of age or older. You are African American. You have a family history of prostate cancer. You have a family history of breast cancer. What are the signs or symptoms? Symptoms of this condition include: A need to pee often. Peeing that is weak, or pee that stops and starts. Trouble starting or stopping your pee. Inability to pee. Blood in your pee or semen. Pain in the lower back, lower belly (abdomen), hips, or upper thighs. Trouble getting an erection. Trouble emptying all of your pee. How is this treated? Treatment for this condition depends on your age, your health, the kind of treatment you like, and how far the cancer has spread. Treatments include: Being watched. This is called observation. You will be tested from time to time, but you will not get treated. Tests are to make sure that the cancer is not growing. Surgery. This may be done to remove the prostate, to remove the testicles, or to freeze or kill cancer cells. Radiation. This uses a strong beam to kill cancer cells. Ultrasound energy. This uses strong sound waves to kill cancer cells. Chemotherapy. This uses medicines that stop cancer cells from increasing. This kills cancer cells and healthy cells. Targeted therapy. This kills cancer cells only. Healthy cells are not affected. Hormone treatment. This stops the body from making hormones that help the cancer cells to grow. Follow these instructions at home: Take over-the-counter and prescription medicines only as told by your doctor. Eat a healthy diet. Get plenty of sleep. Ask your doctor for help to find a support group for men  with prostate cancer. If you have to go to the hospital, let your cancer doctor (oncologist) know. Treatment may affect your ability to have sex. Touch, hold, hug, and caress your partner to have intimate moments. Keep all follow-up visits as told by your doctor. This is important. Contact a doctor if: You have new or more trouble peeing. You have new or more blood in your pee. You have new or more pain in your hips, back, or chest. Get help right away if: You have weakness in your legs. You lose feeling in your legs. You cannot control your pee or your poop (stool). You have chills or a fever. Summary The prostate is a male gland that helps make semen. Prostate cancer is when abnormal cells grow in this gland. Treatment includes doing surgery, using medicines, using very strong beams, or watching without treatment. Ask your doctor for help to find a support group for men with prostate cancer. Contact a doctor if you have problems peeing or have any new pain that you did not have before. This information is not intended to replace advice given to you by your health care provider. Make sure you discuss any questions you have with your healthcare provider. Document Revised: 04/01/2020 Document Reviewed: 02/11/2019 Elsevier Patient Education  2022 [employer].  

## 2020-11-19 DIAGNOSIS — J449 Chronic obstructive pulmonary disease, unspecified: Secondary | ICD-10-CM | POA: Diagnosis not present

## 2020-11-19 DIAGNOSIS — R0902 Hypoxemia: Secondary | ICD-10-CM | POA: Diagnosis not present

## 2020-12-15 DIAGNOSIS — R69 Illness, unspecified: Secondary | ICD-10-CM | POA: Diagnosis not present

## 2021-01-04 ENCOUNTER — Other Ambulatory Visit: Payer: Self-pay

## 2021-01-04 ENCOUNTER — Other Ambulatory Visit: Payer: Medicare Other

## 2021-01-04 DIAGNOSIS — C61 Malignant neoplasm of prostate: Secondary | ICD-10-CM | POA: Diagnosis not present

## 2021-01-05 LAB — PSA: Prostate Specific Ag, Serum: 1.1 ng/mL (ref 0.0–4.0)

## 2021-01-12 ENCOUNTER — Encounter: Payer: Self-pay | Admitting: Urology

## 2021-01-12 ENCOUNTER — Ambulatory Visit (INDEPENDENT_AMBULATORY_CARE_PROVIDER_SITE_OTHER): Payer: Medicare Other | Admitting: Urology

## 2021-01-12 ENCOUNTER — Other Ambulatory Visit: Payer: Self-pay

## 2021-01-12 VITALS — BP 119/64 | HR 86

## 2021-01-12 DIAGNOSIS — C61 Malignant neoplasm of prostate: Secondary | ICD-10-CM

## 2021-01-12 MED ORDER — LEUPROLIDE ACETATE (3 MONTH) 22.5 MG IM KIT
22.5000 mg | PACK | Freq: Once | INTRAMUSCULAR | Status: AC
  Administered 2021-01-12: 22.5 mg via INTRAMUSCULAR

## 2021-01-12 NOTE — Progress Notes (Signed)
01/12/2021 2:45 PM   Danny Martinez 20-Oct-1933 [id number]  Referring provider: Neale Burly, MD [address] [place],  [place] [address]  Followup prostate cancer   HPI: Danny Martinez is a 87yo here for followup for metastatic prostate cancer. CT and bone scan from 8/19 showed no evidence of metastatic. PSA increased to 1.1 from 1.0 3 months ago. No bone pain. No significant LUTS.  He is due for eligard today   PMH: Past Medical History:  Diagnosis Date   COPD (chronic obstructive pulmonary disease) ([place])    Emphysema    History of prostate cancer    Hypoxemia    Pulmonary hypertension (H[facility])    PVC (premature ventricular contraction)     Surgical History: Past Surgical History:  Procedure Laterality Date   CYSTECTOMY     PROSTATECTOMY      Home Medications:  Allergies as of 01/12/2021       Reactions   Neomycin-bacitracin Zn-polymyx    REACTION: rash        Medication List        Accurate as of January 12, 2021  2:45 PM. If you have any questions, ask your nurse or doctor.          STOP taking these medications    aspirin 81 MG tablet Stopped by: Nicolette Bang, MD   omeprazole 40 MG capsule Commonly known as: PRILOSEC Stopped by: Nicolette Bang, MD       TAKE these medications    albuterol (2.5 MG/3ML) 0.083% nebulizer solution Commonly known as: PROVENTIL Take 2.5 mg by nebulization every 4 (four) hours as needed.   atenolol 50 MG tablet Commonly known as: TENORMIN Take 50 mg by mouth daily.   docusate sodium 100 MG capsule Commonly known as: COLACE Take 100 mg by mouth 2 (two) times daily.   Fluticasone-Salmeterol 250-50 MCG/DOSE Aepb Commonly known as: ADVAIR Inhale 1 puff into the lungs every 12 (twelve) hours.   glimepiride 1 MG tablet Commonly known as: AMARYL Take 1 mg by mouth daily before breakfast.   HM Senna 8.6 MG tablet Generic drug: senna Take 2 tablets by mouth 2 (two) times daily.   OXYGEN Inhale 3.5 L  into the lungs.   sodium chloride (hypertonic) 3 % solution SMARTSIG:3 Milliliter(s) Via Nebulizer Twice Daily PRN   telmisartan-hydrochlorothiazide 80-25 MG tablet Commonly known as: MICARDIS HCT Take 1 tablet by mouth daily.   tiotropium 18 MCG inhalation capsule Commonly known as: SPIRIVA Place 18 mcg into inhaler and inhale daily.   Vitamin D-3 125 MCG (5000 UT) Tabs Take 1 tablet by mouth daily.        Allergies:  Allergies  Allergen Reactions   Neomycin-Bacitracin Zn-Polymyx     REACTION: rash    Family History: Family History  Problem Relation Age of Onset   Sarcoidosis Brother    Heart disease Father    Rheum arthritis Sister    Rheum arthritis Sister    Rheum arthritis Father    Cancer Mother     Social History:  reports that he quit smoking about 38 years ago. His smoking use included cigarettes. He has a 20.00 pack-year smoking history. He has never used smokeless tobacco. He reports that he does not drink alcohol and does not use drugs.  ROS: All other review of systems were reviewed and are negative except what is noted above in HPI  Physical Exam: BP 119/64   Pulse 86   Constitutional:  Alert and oriented,  No acute distress. HEENT: [place] AT, moist mucus membranes.  Trachea midline, no masses. Cardiovascular: No clubbing, cyanosis, or edema. Respiratory: Normal respiratory effort, no increased work of breathing. GI: Abdomen is soft, nontender, nondistended, no abdominal masses GU: No CVA tenderness.  Lymph: No cervical or inguinal lymphadenopathy. Skin: No rashes, bruises or suspicious lesions. Neurologic: Grossly intact, no focal deficits, moving all 4 extremities. Psychiatric: Normal mood and affect.  Laboratory Data: Lab Results  Component Value Date   WBC 7.2 10/23/2006   HGB 9.7 (L) 10/23/2006   HCT 29.7 (L) 10/23/2006   MCV 88.3 10/23/2006   PLT 353 10/23/2006    Lab Results  Component Value Date   CREATININE 1.10 10/29/2020     Lab Results  Component Value Date   PSA 0.3 09/30/2019   PSA 0.3 06/24/2019   PSA 0.4 03/26/2019    No results found for: TESTOSTERONE  No results found for: HGBA1C  Urinalysis    Component Value Date/Time   COLORURINE YELLOW 10/14/2006 1821   APPEARANCEUR Clear 04/15/2020 1358   LABSPEC 1.010 10/14/2006 1821   PHURINE 5.5 10/14/2006 1821   GLUCOSEU Negative 04/15/2020 1358   HGBUR TRACE (A) 10/14/2006 1821   BILIRUBINUR Negative 04/15/2020 1358   [place] 10/14/2006 1821   PROTEINUR Negative 04/15/2020 1358   PROTEINUR NEGATIVE 10/14/2006 1821   UROBILINOGEN 0.2 10/14/2006 1821   NITRITE Negative 04/15/2020 1358   NITRITE NEGATIVE 10/14/2006 1821   LEUKOCYTESUR Trace (A) 04/15/2020 1358    Lab Results  Component Value Date   LABMICR See below: 04/15/2020   WBCUA 0-5 04/15/2020   LABEPIT 0-10 04/15/2020   BACTERIA None seen 04/15/2020    Pertinent Imaging:  No results found for this or any previous visit.  No results found for this or any previous visit.  No results found for this or any previous visit.  No results found for this or any previous visit.  No results found for this or any previous visit.  No results found for this or any previous visit.  No results found for this or any previous visit.  No results found for this or any previous visit.   Assessment & Plan:    1. Prostate cancer ([place]) -RTC 3 months with PSA - leuprolide (LUPRON) injection 22.5 mg - Urinalysis, Routine w reflex microscopic   No follow-ups on file.  Nicolette Bang, MD  [facility] [place]

## 2021-01-12 NOTE — Progress Notes (Signed)
Urological Symptom Review  Patient is experiencing the following symptoms: Frequent urination   Review of Systems  Gastrointestinal (upper)  : Indigestion/heartburn  Gastrointestinal (lower) : Negative for lower GI symptoms  Constitutional : Negative for symptoms  Skin: Negative for skin symptoms  Eyes: Negative for eye symptoms  Ear/Nose/Throat : Sinus problems  Hematologic/Lymphatic: Negative for Hematologic/Lymphatic symptoms  Cardiovascular : Negative for cardiovascular symptoms  Respiratory : COPD  Endocrine: Negative for endocrine symptoms  Musculoskeletal: Negative for musculoskeletal symptoms  Neurological: Negative for neurological symptoms  Psychologic: Negative for psychiatric symptoms  Lupron IM Injection   Due to Prostate Cancer patient is present today for a Lupron Injection.  Medication: Lupron 3 month Dose: 22.5 mg  Location: left upper outer buttocks Patient tolerated well, no complications were noted  Performed by: Shamiyah Ngu LPN  Follow up: Per MD note

## 2021-01-12 NOTE — Patient Instructions (Signed)
Prostate Cancer The prostate is a small gland that produces fluid that makes up semen (seminal fluid). It is located below the bladder in men, in front of the rectum. Prostate cancer is the abnormal growth of cells in the prostate gland. What are the causes? The exact cause of this condition is not known. What increases the risk? You are more likely to develop this condition if: You are 85 years of age or older. You have a family history of prostate cancer. You have a family history of breast and ovarian cancer. You have genes that are passed from parent to child (inherited), such as BRCA1 and BRCA2. You have Lynch syndrome. African American men and men of African descent are diagnosed with prostate cancer at higher rates than other men. The reasons for this are not well understood and are likely due to a combination of genetic and environmental factors. What are the signs or symptoms? Symptoms of this condition include: Problems with urination. This may include: A weak or interrupted flow of urine. Trouble starting or stopping urination. Trouble emptying the bladder all the way. The need to urinate more often, especially at night. Blood in urine or semen. Persistent pain or discomfort in the lower back, lower abdomen, or hips. Trouble getting an erection. Weakness or numbness in the legs or feet. How is this diagnosed? This condition can be diagnosed with: A digital rectal exam. For this exam, a health care provider inserts a gloved finger into the rectum to feel the prostate gland. A blood test called a prostate-specific antigen (PSA) test. A procedure in which a sample of tissue is taken from the prostate and checked under a microscope (prostate biopsy). An imaging test called transrectal ultrasonography. Once the condition is diagnosed, tests will be done to determine how far the cancer has spread. This is called staging the cancer. Staging may involve imaging tests, such as a bone  scan, CT scan, PET scan, or MRI. Stages of prostate cancer The stages of prostate cancer are as follows: Stage 1 (I). At this stage, the cancer is found in the prostate only. The cancer is not visible on imaging tests, and it is usually found by accident, such as during prostate surgery. Stage 2 (II). At this stage, the cancer is more advanced than it is in stage 1, but the cancer has not spread outside the prostate. Stage 3 (III). At this stage, the cancer has spread beyond the outer layer of the prostate to nearby tissues. The cancer may be found in the seminal vesicles, which are near the bladder and the prostate. Stage 4 (IV). At this stage, the cancer has spread to other parts of the body, such as the lymph nodes, bones, bladder, rectum, liver, or lungs. Prostate cancer grading Prostate cancer is also graded according to how the cancer cells look under a microscope. This is called the Gleason score and the total score can range from 6-10, indicating how likely it is that the cancer will spread (metastasize) to other parts of the body. The higher the score, the greater the likelihood that the cancer will spread. Gleason 6 or lower: This indicates that the cancer cells look similar to normal prostate cells (well differentiated). Gleason 7: This indicates that the cancer cells look somewhat similar to normal prostate cells (moderately differentiated). Gleason 8, 9, or 10: This indicates that the cancer cells look very different than normal prostate cells (poorly differentiated). How is this treated? Treatment for this condition depends on several factors,  including the stage of the cancer, your age, personal preferences, and your overall health. Talk with your health care provider about treatment options that are recommended for you. Common treatments include: Observation for early stage prostate cancer (active surveillance). This involves having exams, blood tests, and in some cases, more biopsies.  For some men, this is the only treatment needed. Surgery. Types of surgeries include: Open surgery (radical prostatectomy). In this surgery, a larger incision is made to remove the prostate. A laparoscopic radical prostatectomy. This is a surgery to remove the prostate and lymph nodes through several small incisions. It is often referred to as a minimally invasive surgery. A robotic radical prostatectomy. This is laparoscopic surgery to remove the prostate and lymph nodes with the help of robotic arms that are controlled by the surgeon. Cryoablation. This is surgery to freeze and destroy cancer cells. Radiation treatment. Types of radiation treatment include: External beam radiation. This type aims beams of radiation from outside the body at the prostate to destroy cancerous cells. Brachytherapy. This type uses radioactive needles, seeds, wires, or tubes that are implanted into the prostate gland. Like external beam radiation, brachytherapy destroys cancerous cells. An advantage is that this type of radiation limits the damage to surrounding tissue and has fewer side effects. Chemotherapy. This treatment kills cancer cells or stops them from multiplying. It kills both cancer cells and normal cells. Targeted therapy. This treatment uses medicines to kill cancer cells without damaging normal cells. Hormone treatment. This treatment involves taking medicines that act on testosterone, one of the male hormones, by: Stopping your body from producing testosterone. Blocking testosterone from reaching cancer cells. Follow these instructions at home: Lifestyle Do not use any products that contain nicotine or tobacco. These products include cigarettes, chewing tobacco, and vaping devices, such as e-cigarettes. If you need help quitting, ask your health care provider. Eat a healthy diet. To do this: Eat foods that are high in fiber. These include beans, whole grains, and fresh fruits and vegetables. Limit  foods that are high in fat and sugar. These include fried or sweet foods. Treatment for prostate cancer may affect sexual function. If you have a partner, continue to have intimate moments. This may include touching, holding, hugging, and caressing your partner. Get plenty of sleep. Consider joining a support group for men who have prostate cancer. Meeting with a support group may help you learn to manage the stress of having cancer. General instructions Take over-the-counter and prescription medicines only as told by your health care provider. If you have to go to the hospital, notify your cancer specialist (oncologist). Keep all follow-up visits. This is important. Where to find more information American Cancer Society: www.cancer.org American Society of Clinical Oncology: www.cancer.net National Cancer Institute: www.cancer.gov Contact a health care provider if: You have new or increasing trouble urinating. You have new or increasing blood in your urine. You have new or increasing pain in your hips, back, or chest. Get help right away if: You have weakness or numbness in your legs. You cannot control urination or your bowel movements (incontinence). You have chills or a fever. Summary The prostate is a small gland that is involved in the production of semen. It is located below a man's bladder, in front of the rectum. Prostate cancer is the abnormal growth of cells in the prostate gland. Treatment for this condition depends on the stage of the cancer, your age, personal preferences, and your overall health. Talk with your health care provider about treatment   options that are recommended for you. Consider joining a support group for men who have prostate cancer. Meeting with a support group may help you learn to manage the stress of having cancer. This information is not intended to replace advice given to you by your health care provider. Make sure you discuss any questions you have with  your health care provider. Document Revised: 05/26/2020 Document Reviewed: 05/26/2020 Elsevier Patient Education  2022 Elsevier Inc.  

## 2021-01-31 DIAGNOSIS — I10 Essential (primary) hypertension: Secondary | ICD-10-CM | POA: Diagnosis not present

## 2021-01-31 DIAGNOSIS — J440 Chronic obstructive pulmonary disease with acute lower respiratory infection: Secondary | ICD-10-CM | POA: Diagnosis not present

## 2021-01-31 DIAGNOSIS — E1169 Type 2 diabetes mellitus with other specified complication: Secondary | ICD-10-CM | POA: Diagnosis not present

## 2021-01-31 DIAGNOSIS — K219 Gastro-esophageal reflux disease without esophagitis: Secondary | ICD-10-CM | POA: Diagnosis not present

## 2021-02-08 DIAGNOSIS — R079 Chest pain, unspecified: Secondary | ICD-10-CM | POA: Diagnosis not present

## 2021-04-11 ENCOUNTER — Other Ambulatory Visit: Payer: Medicare Other

## 2021-04-12 ENCOUNTER — Other Ambulatory Visit: Payer: Self-pay

## 2021-04-12 ENCOUNTER — Other Ambulatory Visit: Payer: Medicare Other

## 2021-04-12 DIAGNOSIS — C61 Malignant neoplasm of prostate: Secondary | ICD-10-CM

## 2021-04-13 LAB — PSA: Prostate Specific Ag, Serum: 1.5 ng/mL (ref 0.0–4.0)

## 2021-04-15 ENCOUNTER — Ambulatory Visit: Payer: Medicare Other | Admitting: Urology

## 2021-04-18 ENCOUNTER — Ambulatory Visit: Payer: Medicare Other | Admitting: Urology

## 2021-04-22 ENCOUNTER — Encounter: Payer: Self-pay | Admitting: Urology

## 2021-04-22 ENCOUNTER — Other Ambulatory Visit: Payer: Self-pay

## 2021-04-22 ENCOUNTER — Ambulatory Visit (INDEPENDENT_AMBULATORY_CARE_PROVIDER_SITE_OTHER): Payer: Medicare Other | Admitting: Urology

## 2021-04-22 VITALS — BP 162/68 | HR 80 | Wt 226.0 lb

## 2021-04-22 DIAGNOSIS — C61 Malignant neoplasm of prostate: Secondary | ICD-10-CM | POA: Diagnosis not present

## 2021-04-22 MED ORDER — LEUPROLIDE ACETATE (3 MONTH) 22.5 MG IM KIT
22.5000 mg | PACK | Freq: Once | INTRAMUSCULAR | Status: AC
Start: 2021-04-22 — End: 2021-04-22
  Administered 2021-04-22: 22.5 mg via INTRAMUSCULAR

## 2021-04-22 NOTE — Progress Notes (Signed)
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 His PSA increased to 1.5 from 1.1 three months ago. He denies any bone pain. He denies any fatigue. No worsening LUTS.   PMH: Past Medical History:  Diagnosis Date   COPD (chronic obstructive pulmonary disease) ([place])    Emphysema    History of prostate cancer    Hypoxemia    Pulmonary hypertension ([facility])    PVC (premature ventricular contraction)     Surgical History: Past Surgical History:  Procedure Laterality Date   CYSTECTOMY     PROSTATECTOMY      Home Medications:  Allergies as of 04/22/2021       Reactions   Neomycin-bacitracin Zn-polymyx    REACTION: rash        Medication List        Accurate as of April 22, 2021 10:33 AM. If you have any questions, ask your nurse or doctor.          albuterol (2.5 MG/3ML) 0.083% nebulizer solution Commonly known as: PROVENTIL Take 2.5 mg by nebulization every 4 (four) hours as needed.   atenolol 50 MG tablet Commonly known as: TENORMIN Take 50 mg by mouth daily.   docusate sodium 100 MG capsule Commonly known as: COLACE Take 100 mg by mouth 2 (two) times daily.   Fluticasone-Salmeterol 250-50 MCG/DOSE Aepb Commonly known as: ADVAIR Inhale 1 puff into the lungs every 12 (twelve) hours.   glimepiride 1 MG tablet Commonly known as: AMARYL Take 1 mg by mouth daily before breakfast.   HM Senna 8.6 MG tablet Generic drug: senna Take 2 tablets by mouth 2 (two) times daily.   OXYGEN Inhale 3.5 L into the lungs.   sodium chloride (hypertonic) 3 % solution SMARTSIG:3 Milliliter(s) Via Nebulizer Twice Daily PRN   telmisartan-hydrochlorothiazide 80-25 MG tablet Commonly known as: MICARDIS HCT Take 1 tablet by mouth daily.   tiotropium  18 MCG inhalation capsule Commonly known as: SPIRIVA Place 18 mcg into inhaler and inhale daily.   Vitamin D-3 125 MCG (5000 UT) Tabs Take 1 tablet by mouth daily.        Allergies:  Allergies  Allergen Reactions   Neomycin-Bacitracin Zn-Polymyx     REACTION: rash    Family History: Family History  Problem Relation Age of Onset   Sarcoidosis Brother    Heart disease Father    Rheum arthritis Sister    Rheum arthritis Sister    Rheum arthritis Father    Cancer Mother     Social History:  reports that he quit smoking about 39 years ago. His smoking use included cigarettes. He has a 20.00 pack-year smoking history. He has never used smokeless tobacco. He reports that he does not drink alcohol and does not use drugs.  ROS: All other review of systems were reviewed and are negative except what is noted above in HPI  Physical Exam: BP (!) 162/68    Pulse 80    Wt 226 lb (102.5 kg)    BMI 30.23 kg/m   Constitutional:  Alert and oriented, No acute distress. HEENT: [place] AT, moist mucus membranes.  Trachea midline, no masses. Cardiovascular: No clubbing, cyanosis, or edema. Respiratory: Normal respiratory effort, no increased work of breathing. GI: Abdomen is soft, nontender, nondistended, no abdominal  masses GU: No CVA tenderness.  Lymph: No cervical or inguinal lymphadenopathy. Skin: No rashes, bruises or suspicious lesions. Neurologic: Grossly intact, no focal deficits, moving all 4 extremities. Psychiatric: Normal mood and affect.  Laboratory Data: Lab Results  Component Value Date   WBC 7.2 10/23/2006   HGB 9.7 (L) 10/23/2006   HCT 29.7 (L) 10/23/2006   MCV 88.3 10/23/2006   PLT 353 10/23/2006    Lab Results  Component Value Date   CREATININE 1.10 10/29/2020    Lab Results  Component Value Date   PSA 0.3 09/30/2019   PSA 0.3 06/24/2019   PSA 0.4 03/26/2019    No results found for: TESTOSTERONE  No results found for: HGBA1C  Urinalysis    Component  Value Date/Time   COLORURINE YELLOW 10/14/2006 1821   APPEARANCEUR Clear 04/15/2020 1358   LABSPEC 1.010 10/14/2006 1821   PHURINE 5.5 10/14/2006 1821   GLUCOSEU Negative 04/15/2020 1358   HGBUR TRACE (A) 10/14/2006 1821   BILIRUBINUR Negative 04/15/2020 1358   [place] 10/14/2006 1821   PROTEINUR Negative 04/15/2020 1358   PROTEINUR NEGATIVE 10/14/2006 1821   UROBILINOGEN 0.2 10/14/2006 1821   NITRITE Negative 04/15/2020 1358   NITRITE NEGATIVE 10/14/2006 1821   LEUKOCYTESUR Trace (A) 04/15/2020 1358    Lab Results  Component Value Date   LABMICR See below: 04/15/2020   WBCUA 0-5 04/15/2020   LABEPIT 0-10 04/15/2020   BACTERIA None seen 04/15/2020    Pertinent Imaging:  No results found for this or any previous visit.  No results found for this or any previous visit.  No results found for this or any previous visit.  No results found for this or any previous visit.  No results found for this or any previous visit.  No results found for this or any previous visit.  No results found for this or any previous visit.  No results found for this or any previous visit.   Assessment & Plan:    1. Prostate cancer ([place]) -RTC 3 months with PSA. We discussed PSMA Pet and then starting additional therapy and    No follow-ups on file.  Nicolette Bang, MD  [facility] [place]

## 2021-04-22 NOTE — Patient Instructions (Signed)
Prostate Cancer The prostate is a small gland that produces fluid that makes up semen (seminal fluid). It is located below the bladder in men, in front of the rectum. Prostate cancer is the abnormal growth of cells in the prostate gland. What are the causes? The exact cause of this condition is not known. What increases the risk? You are more likely to develop this condition if: You are 86 years of age or older. You have a family history of prostate cancer. You have a family history of breast and ovarian cancer. You have genes that are passed from parent to child (inherited), such as BRCA1 and BRCA2. You have Lynch syndrome. African American men and men of African descent are diagnosed with prostate cancer at higher rates than other men. The reasons for this are not well understood and are likely due to a combination of genetic and environmental factors. What are the signs or symptoms? Symptoms of this condition include: Problems with urination. This may include: A weak or interrupted flow of urine. Trouble starting or stopping urination. Trouble emptying the bladder all the way. The need to urinate more often, especially at night. Blood in urine or semen. Persistent pain or discomfort in the lower back, lower abdomen, or hips. Trouble getting an erection. Weakness or numbness in the legs or feet. How is this diagnosed? This condition can be diagnosed with: A digital rectal exam. For this exam, a health care provider inserts a gloved finger into the rectum to feel the prostate gland. A blood test called a prostate-specific antigen (PSA) test. A procedure in which a sample of tissue is taken from the prostate and checked under a microscope (prostate biopsy). An imaging test called transrectal ultrasonography. Once the condition is diagnosed, tests will be done to determine how far the cancer has spread. This is called staging the cancer. Staging may involve imaging tests, such as a bone  scan, CT scan, PET scan, or MRI. Stages of prostate cancer The stages of prostate cancer are as follows: Stage 1 (I). At this stage, the cancer is found in the prostate only. The cancer is not visible on imaging tests, and it is usually found by accident, such as during prostate surgery. Stage 2 (II). At this stage, the cancer is more advanced than it is in stage 1, but the cancer has not spread outside the prostate. Stage 3 (III). At this stage, the cancer has spread beyond the outer layer of the prostate to nearby tissues. The cancer may be found in the seminal vesicles, which are near the bladder and the prostate. Stage 4 (IV). At this stage, the cancer has spread to other parts of the body, such as the lymph nodes, bones, bladder, rectum, liver, or lungs. Prostate cancer grading Prostate cancer is also graded according to how the cancer cells look under a microscope. This is called the Gleason score and the total score can range from 6-10, indicating how likely it is that the cancer will spread (metastasize) to other parts of the body. The higher the score, the greater the likelihood that the cancer will spread. Gleason 6 or lower: This indicates that the cancer cells look similar to normal prostate cells (well differentiated). Gleason 7: This indicates that the cancer cells look somewhat similar to normal prostate cells (moderately differentiated). Gleason 8, 9, or 10: This indicates that the cancer cells look very different than normal prostate cells (poorly differentiated). How is this treated? Treatment for this condition depends on several factors,  including the stage of the cancer, your age, personal preferences, and your overall health. Talk with your health care provider about treatment options that are recommended for you. Common treatments include: °Observation for early stage prostate cancer (active surveillance). This involves having exams, blood tests, and in some cases, more biopsies.  For some men, this is the only treatment needed. °Surgery. Types of surgeries include: °Open surgery (radical prostatectomy). In this surgery, a larger incision is made to remove the prostate. °A laparoscopic radical prostatectomy. This is a surgery to remove the prostate and lymph nodes through several small incisions. It is often referred to as a minimally invasive surgery. °A robotic radical prostatectomy. This is laparoscopic surgery to remove the prostate and lymph nodes with the help of robotic arms that are controlled by the surgeon. °Cryoablation. This is surgery to freeze and destroy cancer cells. °Radiation treatment. Types of radiation treatment include: °External beam radiation. This type aims beams of radiation from outside the body at the prostate to destroy cancerous cells. °Brachytherapy. This type uses radioactive needles, seeds, wires, or tubes that are implanted into the prostate gland. Like external beam radiation, brachytherapy destroys cancerous cells. An advantage is that this type of radiation limits the damage to surrounding tissue and has fewer side effects. °Chemotherapy. This treatment kills cancer cells or stops them from multiplying. It kills both cancer cells and normal cells. °Targeted therapy. This treatment uses medicines to kill cancer cells without damaging normal cells. °Hormone treatment. This treatment involves taking medicines that act on testosterone, one of the male hormones, by: °Stopping your body from producing testosterone. °Blocking testosterone from reaching cancer cells. °Follow these instructions at home: °Lifestyle °Do not use any products that contain nicotine or tobacco. These products include cigarettes, chewing tobacco, and vaping devices, such as e-cigarettes. If you need help quitting, ask your health care provider. °Eat a healthy diet. To do this: °Eat foods that are high in fiber. These include beans, whole grains, and fresh fruits and vegetables. °Limit  foods that are high in fat and sugar. These include fried or sweet foods. °Treatment for prostate cancer may affect sexual function. If you have a partner, continue to have intimate moments. This may include touching, holding, hugging, and caressing your partner. °Get plenty of sleep. °Consider joining a support group for men who have prostate cancer. Meeting with a support group may help you learn to manage the stress of having cancer. °General instructions °Take over-the-counter and prescription medicines only as told by your health care provider. °If you have to go to the hospital, notify your cancer specialist (oncologist). °Keep all follow-up visits. This is important. °Where to find more information °American Cancer Society: www.cancer.org °American Society of Clinical Oncology: www.cancer.net °National Cancer Institute: www.cancer.gov °Contact a health care provider if: °You have new or increasing trouble urinating. °You have new or increasing blood in your urine. °You have new or increasing pain in your hips, back, or chest. °Get help right away if: °You have weakness or numbness in your legs. °You cannot control urination or your bowel movements (incontinence). °You have chills or a fever. °Summary °The prostate is a small gland that is involved in the production of semen. It is located below a man's bladder, in front of the rectum. °Prostate cancer is the abnormal growth of cells in the prostate gland. °Treatment for this condition depends on the stage of the cancer, your age, personal preferences, and your overall health. Talk with your health care provider about treatment   options that are recommended for you. °Consider joining a support group for men who have prostate cancer. Meeting with a support group may help you learn to manage the stress of having cancer. °This information is not intended to replace advice given to you by your health care provider. Make sure you discuss any questions you have with  your health care provider. °Document Revised: 05/26/2020 Document Reviewed: 05/26/2020 °Elsevier Patient Education © 2022 [employer] ° °

## 2021-05-02 DIAGNOSIS — E1169 Type 2 diabetes mellitus with other specified complication: Secondary | ICD-10-CM | POA: Diagnosis not present

## 2021-05-02 DIAGNOSIS — R630 Anorexia: Secondary | ICD-10-CM | POA: Diagnosis not present

## 2021-05-02 DIAGNOSIS — J440 Chronic obstructive pulmonary disease with acute lower respiratory infection: Secondary | ICD-10-CM | POA: Diagnosis not present

## 2021-05-02 DIAGNOSIS — K219 Gastro-esophageal reflux disease without esophagitis: Secondary | ICD-10-CM | POA: Diagnosis not present

## 2021-05-02 DIAGNOSIS — H6122 Impacted cerumen, left ear: Secondary | ICD-10-CM | POA: Diagnosis not present

## 2021-05-02 DIAGNOSIS — I10 Essential (primary) hypertension: Secondary | ICD-10-CM | POA: Diagnosis not present

## 2021-07-13 ENCOUNTER — Other Ambulatory Visit: Payer: Medicare Other

## 2021-07-13 DIAGNOSIS — C61 Malignant neoplasm of prostate: Secondary | ICD-10-CM

## 2021-07-14 LAB — PSA: Prostate Specific Ag, Serum: 1.8 ng/mL (ref 0.0–4.0)

## 2021-07-20 ENCOUNTER — Ambulatory Visit: Payer: Medicare Other | Admitting: Urology

## 2021-07-20 ENCOUNTER — Encounter: Payer: Self-pay | Admitting: Urology

## 2021-07-20 VITALS — BP 107/61 | HR 101

## 2021-07-20 DIAGNOSIS — C61 Malignant neoplasm of prostate: Secondary | ICD-10-CM

## 2021-07-20 MED ORDER — LEUPROLIDE ACETATE (3 MONTH) 22.5 MG IM KIT
22.5000 mg | PACK | Freq: Once | INTRAMUSCULAR | Status: AC
  Administered 2021-07-20: 22.5 mg via INTRAMUSCULAR

## 2021-07-20 NOTE — Patient Instructions (Signed)

## 2021-07-20 NOTE — Progress Notes (Signed)
Lupron IM Injection  ? ?Due to Prostate Cancer patient is present today for a Lupron Injection. ? ?Medication: Lupron 3 month ?Dose: 22.5 mg  ?Location: left upper outer buttocks ? ? ?Patient tolerated well, no complications were noted ? ?Performed by: Noele Icenhour LPN ? ?Follow up: Per MD note  ?

## 2021-07-20 NOTE — Progress Notes (Signed)
? ?07/20/2021 ?2:32 PM  ? ?Danny Martinez ?02-18-1934 ?[id number] ? ?Referring provider: Neale Burly, MD ?[address] ?[place],  [place] [address] ? ?Followup prostate cancer ? ? ?HPI: ?Danny Martinez is a 88yo here for followup for prostate cancer PSA increased to 1.8 from 1.5. He is currently on eligard every 3 months.  He denies any significant LUTS. No other complaints today ? ? ?PMH: ?Past Medical History:  ?Diagnosis Date  ? COPD (chronic obstructive pulmonary disease) ([place])   ? Emphysema   ? History of prostate cancer   ? Hypoxemia   ? Pulmonary hypertension ([place])   ? PVC (premature ventricular contraction)   ? ? ?Surgical History: ?Past Surgical History:  ?Procedure Laterality Date  ? CYSTECTOMY    ? PROSTATECTOMY    ? ? ?Home Medications:  ?Allergies as of 07/20/2021   ? ?   Reactions  ? Neomycin-bacitracin Zn-polymyx   ? REACTION: rash  ? ?  ? ?  ?Medication List  ?  ? ?  ? Accurate as of Jul 20, 2021  2:32 PM. If you have any questions, ask your nurse or doctor.  ?  ?  ? ?  ? ?albuterol (2.5 MG/3ML) 0.083% nebulizer solution ?Commonly known as: PROVENTIL ?Take 2.5 mg by nebulization every 4 (four) hours as needed. ?  ?atenolol 50 MG tablet ?Commonly known as: TENORMIN ?Take 50 mg by mouth daily. ?  ?docusate sodium 100 MG capsule ?Commonly known as: COLACE ?Take 100 mg by mouth 2 (two) times daily. ?  ?famotidine 20 MG tablet ?Commonly known as: PEPCID ?Take 20 mg by mouth 2 (two) times daily. ?  ?Fluticasone-Salmeterol 250-50 MCG/DOSE Aepb ?Commonly known as: ADVAIR ?Inhale 1 puff into the lungs every 12 (twelve) hours. ?  ?glimepiride 1 MG tablet ?Commonly known as: AMARYL ?Take 1 mg by mouth daily before breakfast. ?  ?HM Senna 8.6 MG tablet ?Generic drug: senna ?Take 2 tablets by mouth 2 (two) times daily. ?  ?OXYGEN ?Inhale 3.5 L into the lungs. ?  ?sodium chloride (hypertonic) 3 % solution ?SMARTSIG:3 Milliliter(s) Via Nebulizer Twice Daily PRN ?  ?telmisartan-hydrochlorothiazide 80-25 MG  tablet ?Commonly known as: MICARDIS HCT ?Take 1 tablet by mouth daily. ?  ?tiotropium 18 MCG inhalation capsule ?Commonly known as: SPIRIVA ?Place 18 mcg into inhaler and inhale daily. ?  ?Vitamin D-3 125 MCG (5000 UT) Tabs ?Take 1 tablet by mouth daily. ?  ? ?  ? ? ?Allergies:  ?Allergies  ?Allergen Reactions  ? Neomycin-Bacitracin Zn-Polymyx   ?  REACTION: rash  ? ? ?Family History: ?Family History  ?Problem Relation Age of Onset  ? Sarcoidosis Brother   ? Heart disease Father   ? Rheum arthritis Sister   ? Rheum arthritis Sister   ? Rheum arthritis Father   ? Cancer Mother   ? ? ?Social History:  reports that he quit smoking about 39 years ago. His smoking use included cigarettes. He has a 20.00 pack-year smoking history. He has never used smokeless tobacco. He reports that he does not drink alcohol and does not use drugs. ? ?ROS: ?All other review of systems were reviewed and are negative except what is noted above in HPI ? ?Physical Exam: ?BP 107/61   Pulse (!) 101   ?Constitutional:  Alert and oriented, No acute distress. ?HEENT: [place] AT, moist mucus membranes.  Trachea midline, no masses. ?Cardiovascular: No clubbing, cyanosis, or edema. ?Respiratory: Normal respiratory effort, no increased work of breathing. ?GI: Abdomen is soft,  nontender, nondistended, no abdominal masses ?GU: No CVA tenderness.  ?Lymph: No cervical or inguinal lymphadenopathy. ?Skin: No rashes, bruises or suspicious lesions. ?Neurologic: Grossly intact, no focal deficits, moving all 4 extremities. ?Psychiatric: Normal mood and affect. ? ?Laboratory Data: ?Lab Results  ?Component Value Date  ? WBC 7.2 10/23/2006  ? HGB 9.7 (L) 10/23/2006  ? HCT 29.7 (L) 10/23/2006  ? MCV 88.3 10/23/2006  ? PLT 353 10/23/2006  ? ? ?Lab Results  ?Component Value Date  ? CREATININE 1.10 10/29/2020  ? ? ?Lab Results  ?Component Value Date  ? PSA 0.3 09/30/2019  ? PSA 0.3 06/24/2019  ? PSA 0.4 03/26/2019  ? ? ?No results found for: TESTOSTERONE ? ?No results  found for: HGBA1C ? ?Urinalysis ?   ?Component Value Date/Time  ? [place] YELLOW 10/14/2006 1821  ? APPEARANCEUR Clear 04/15/2020 1358  ? LABSPEC 1.010 10/14/2006 1821  ? PHURINE 5.5 10/14/2006 1821  ? GLUCOSEU Negative 04/15/2020 1358  ? HGBUR TRACE (A) 10/14/2006 1821  ? BILIRUBINUR Negative 04/15/2020 1358  ? [place] NEGATIVE 10/14/2006 1821  ? PROTEINUR Negative 04/15/2020 1358  ? [place] NEGATIVE 10/14/2006 1821  ? UROBILINOGEN 0.2 10/14/2006 1821  ? NITRITE Negative 04/15/2020 1358  ? NITRITE NEGATIVE 10/14/2006 1821  ? LEUKOCYTESUR Trace (A) 04/15/2020 1358  ? ? ?Lab Results  ?Component Value Date  ? LABMICR See below: 04/15/2020  ? WBCUA 0-5 04/15/2020  ? LABEPIT 0-10 04/15/2020  ? BACTERIA None seen 04/15/2020  ? ? ?Pertinent Imaging: ? ?No results found for this or any previous visit. ? ?No results found for this or any previous visit. ? ?No results found for this or any previous visit. ? ?No results found for this or any previous visit. ? ?No results found for this or any previous visit. ? ?No results found for this or any previous visit. ? ?No results found for this or any previous visit. ? ?No results found for this or any previous visit. ? ? ?Assessment & Plan:   ? ?1. Prostate cancer ([place]) ?-RTC 3 months with PSA ?- leuprolide (LUPRON) injection 22.5 mg ?- Urinalysis, Routine w reflex microscopic ? ? ?No follow-ups on file. ? ?Nicolette Bang, MD ? ?[facility] [place] ?, ?

## 2021-08-09 DIAGNOSIS — J449 Chronic obstructive pulmonary disease, unspecified: Secondary | ICD-10-CM | POA: Diagnosis not present

## 2021-08-09 DIAGNOSIS — I10 Essential (primary) hypertension: Secondary | ICD-10-CM | POA: Diagnosis not present

## 2021-08-09 DIAGNOSIS — E1169 Type 2 diabetes mellitus with other specified complication: Secondary | ICD-10-CM | POA: Diagnosis not present

## 2021-08-09 DIAGNOSIS — K219 Gastro-esophageal reflux disease without esophagitis: Secondary | ICD-10-CM | POA: Diagnosis not present

## 2021-08-16 DIAGNOSIS — N281 Cyst of kidney, acquired: Secondary | ICD-10-CM | POA: Diagnosis not present

## 2021-08-16 DIAGNOSIS — R109 Unspecified abdominal pain: Secondary | ICD-10-CM | POA: Diagnosis not present

## 2021-10-17 ENCOUNTER — Other Ambulatory Visit: Payer: Medicare Other

## 2021-10-17 DIAGNOSIS — C61 Malignant neoplasm of prostate: Secondary | ICD-10-CM | POA: Diagnosis not present

## 2021-10-18 LAB — PSA: Prostate Specific Ag, Serum: 2.1 ng/mL (ref 0.0–4.0)

## 2021-10-24 ENCOUNTER — Ambulatory Visit (INDEPENDENT_AMBULATORY_CARE_PROVIDER_SITE_OTHER): Payer: Medicare Other | Admitting: Urology

## 2021-10-24 ENCOUNTER — Encounter: Payer: Self-pay | Admitting: Urology

## 2021-10-24 VITALS — BP 128/67 | HR 76 | Ht 73.5 in | Wt 219.0 lb

## 2021-10-24 DIAGNOSIS — C61 Malignant neoplasm of prostate: Secondary | ICD-10-CM | POA: Diagnosis not present

## 2021-10-24 MED ORDER — LEUPROLIDE ACETATE (3 MONTH) 22.5 MG IM KIT
22.5000 mg | PACK | Freq: Once | INTRAMUSCULAR | Status: AC
  Administered 2021-10-24: 22.5 mg via INTRAMUSCULAR

## 2021-10-24 NOTE — Progress Notes (Signed)
10/24/2021 2:58 PM   Barnie Alderman [birth year] [id number]  Referring provider: Neale Burly, MD [address] [place],  [place] [address]  Followup prostate cancer   HPI: Mr Mondry is a 28RS here for followup for castrate resistant prostate cancer. PSA increased to 2.1 from 1.8. No bone pain. He is due fro ADT today. No significant LUTS.    PMH: Past Medical History:  Diagnosis Date   COPD (chronic obstructive pulmonary disease) ([place])    Emphysema    History of prostate cancer    Hypoxemia    Pulmonary hypertension ([facility])    PVC (premature ventricular contraction)     Surgical History: Past Surgical History:  Procedure Laterality Date   CYSTECTOMY     PROSTATECTOMY      Home Medications:  Allergies as of 10/24/2021       Reactions   Neomycin-bacitracin Zn-polymyx    REACTION: rash        Medication List        Accurate as of October 24, 2021  2:58 PM. If you have any questions, ask your nurse or doctor.          albuterol (2.5 MG/3ML) 0.083% nebulizer solution Commonly known as: PROVENTIL Take 2.5 mg by nebulization every 4 (four) hours as needed.   atenolol 50 MG tablet Commonly known as: TENORMIN Take 50 mg by mouth daily.   docusate sodium 100 MG capsule Commonly known as: COLACE Take 100 mg by mouth 2 (two) times daily.   famotidine 20 MG tablet Commonly known as: PEPCID Take 20 mg by mouth 2 (two) times daily.   Fluticasone-Salmeterol 250-50 MCG/DOSE Aepb Commonly known as: ADVAIR Inhale 1 puff into the lungs every 12 (twelve) hours.   glimepiride 1 MG tablet Commonly known as: AMARYL Take 1 mg by mouth daily before breakfast.   HM Senna 8.6 MG tablet Generic drug: senna Take 2 tablets by mouth 2 (two) times daily.   Linzess 72 MCG capsule Generic drug: linaclotide Take 72 mcg by mouth daily.   OXYGEN Inhale 3.5 L into the lungs.   sodium chloride (hypertonic) 3 % solution SMARTSIG:3 Milliliter(s) Via Nebulizer Twice  Daily PRN   telmisartan-hydrochlorothiazide 80-25 MG tablet Commonly known as: MICARDIS HCT Take 1 tablet by mouth daily.   tiotropium 18 MCG inhalation capsule Commonly known as: SPIRIVA Place 18 mcg into inhaler and inhale daily.   Vitamin D-3 125 MCG (5000 UT) Tabs Take 1 tablet by mouth daily.        Allergies:  Allergies  Allergen Reactions   Neomycin-Bacitracin Zn-Polymyx     REACTION: rash    Family History: Family History  Problem Relation Age of Onset   Sarcoidosis Brother    Heart disease Father    Rheum arthritis Sister    Rheum arthritis Sister    Rheum arthritis Father    Cancer Mother     Social History:  reports that he quit smoking about 39 years ago. His smoking use included cigarettes. He has a 20.00 pack-year smoking history. He has never used smokeless tobacco. He reports that he does not drink alcohol and does not use drugs.  ROS: All other review of systems were reviewed and are negative except what is noted above in HPI  Physical Exam: BP 128/67   Pulse 76   Ht 6' 1.5" (1.867 m)   Wt 219 lb (99.3 kg)   BMI 28.50 kg/m   Constitutional:  Alert and oriented, No acute distress.  HEENT: [place] AT, moist mucus membranes.  Trachea midline, no masses. Cardiovascular: No clubbing, cyanosis, or edema. Respiratory: Normal respiratory effort, no increased work of breathing. GI: Abdomen is soft, nontender, nondistended, no abdominal masses GU: No CVA tenderness.  Lymph: No cervical or inguinal lymphadenopathy. Skin: No rashes, bruises or suspicious lesions. Neurologic: Grossly intact, no focal deficits, moving all 4 extremities. Psychiatric: Normal mood and affect.  Laboratory Data: Lab Results  Component Value Date   WBC 7.2 10/23/2006   HGB 9.7 (L) 10/23/2006   HCT 29.7 (L) 10/23/2006   MCV 88.3 10/23/2006   PLT 353 10/23/2006    Lab Results  Component Value Date   CREATININE 1.10 10/29/2020    Lab Results  Component Value Date   PSA  0.3 09/30/2019   PSA 0.3 06/24/2019   PSA 0.4 03/26/2019    No results found for: "TESTOSTERONE"  No results found for: "HGBA1C"  Urinalysis    Component Value Date/Time   COLORURINE YELLOW 10/14/2006 1821   APPEARANCEUR Clear 04/15/2020 1358   LABSPEC 1.010 10/14/2006 1821   PHURINE 5.5 10/14/2006 1821   GLUCOSEU Negative 04/15/2020 1358   HGBUR TRACE (A) 10/14/2006 1821   BILIRUBINUR Negative 04/15/2020 1358   [place] 10/14/2006 1821   PROTEINUR Negative 04/15/2020 1358   PROTEINUR NEGATIVE 10/14/2006 1821   UROBILINOGEN 0.2 10/14/2006 1821   NITRITE Negative 04/15/2020 1358   NITRITE NEGATIVE 10/14/2006 1821   LEUKOCYTESUR Trace (A) 04/15/2020 1358    Lab Results  Component Value Date   LABMICR See below: 04/15/2020   WBCUA 0-5 04/15/2020   LABEPIT 0-10 04/15/2020   BACTERIA None seen 04/15/2020    Pertinent Imaging:  No results found for this or any previous visit.  No results found for this or any previous visit.  No results found for this or any previous visit.  No results found for this or any previous visit.  No results found for this or any previous visit.  No results found for this or any previous visit.  No results found for this or any previous visit.  No results found for this or any previous visit.   Assessment & Plan:    1. Prostate cancer ([place]) PSMA PET scan, RTC 6 weeks    No follow-ups on file.  Nicolette Bang, MD  [facility] [place]

## 2021-10-24 NOTE — Progress Notes (Signed)
Lupron IM Injection   Due to Prostate Cancer patient is present today for a Lupron Injection.  Medication: Lupron 3 month Dose: 22.5 mg  Location: right upper outer buttocks Lot: [id number] Exp: 05/10/2023  Patient tolerated well, no complications were noted  Performed by: Lucill Mauck, CMA  Follow up: Follow up as scheduled.   

## 2021-11-09 DIAGNOSIS — E1169 Type 2 diabetes mellitus with other specified complication: Secondary | ICD-10-CM | POA: Diagnosis not present

## 2021-11-09 DIAGNOSIS — K219 Gastro-esophageal reflux disease without esophagitis: Secondary | ICD-10-CM | POA: Diagnosis not present

## 2021-11-09 DIAGNOSIS — K5900 Constipation, unspecified: Secondary | ICD-10-CM | POA: Diagnosis not present

## 2021-11-09 DIAGNOSIS — I10 Essential (primary) hypertension: Secondary | ICD-10-CM | POA: Diagnosis not present

## 2021-11-21 ENCOUNTER — Ambulatory Visit (INDEPENDENT_AMBULATORY_CARE_PROVIDER_SITE_OTHER): Payer: Medicare Other | Admitting: Urology

## 2021-11-21 VITALS — BP 126/67 | HR 74

## 2021-11-21 DIAGNOSIS — C61 Malignant neoplasm of prostate: Secondary | ICD-10-CM

## 2021-11-29 NOTE — Progress Notes (Signed)
Patient rescheduled

## 2021-12-01 ENCOUNTER — Encounter (HOSPITAL_COMMUNITY)
Admission: RE | Admit: 2021-12-01 | Discharge: 2021-12-01 | Disposition: A | Discharge status: Home / Self Care | Payer: Medicare Other | Source: Ambulatory Visit | Attending: Urology | Admitting: Urology

## 2021-12-01 DIAGNOSIS — C61 Malignant neoplasm of prostate: Secondary | ICD-10-CM | POA: Insufficient documentation

## 2021-12-01 DIAGNOSIS — C778 Secondary and unspecified malignant neoplasm of lymph nodes of multiple regions: Secondary | ICD-10-CM | POA: Diagnosis not present

## 2021-12-01 MED ORDER — PIFLIFOLASTAT F 18 (PYLARIFY) INJECTION
9.0000 | Freq: Once | INTRAVENOUS | Status: AC
  Administered 2021-12-01: 9.62 via INTRAVENOUS

## 2021-12-07 ENCOUNTER — Ambulatory Visit (INDEPENDENT_AMBULATORY_CARE_PROVIDER_SITE_OTHER): Payer: Medicare Other | Admitting: Urology

## 2021-12-07 VITALS — BP 115/64 | HR 69

## 2021-12-07 DIAGNOSIS — C61 Malignant neoplasm of prostate: Secondary | ICD-10-CM

## 2021-12-07 DIAGNOSIS — C775 Secondary and unspecified malignant neoplasm of intrapelvic lymph nodes: Secondary | ICD-10-CM | POA: Diagnosis not present

## 2021-12-07 MED ORDER — ENZALUTAMIDE 80 MG PO TABS: 160.0000 mg | ORAL_TABLET | Freq: Every day | ORAL | 11 refills | Status: DC

## 2021-12-07 NOTE — Progress Notes (Signed)
12/07/2021 2:37 PM   Danny Martinez [birth year] [id number]  Referring provider: Neale Burly, MD [address] [place],  [place] [address]  Followup prostate cancer   HPI: [name] is a 69YP here for followup for castrate resistant prostate cancer. PSMA PET showed multiple pelvic and retroperitoneal lymph nodes. PSA 2.1. He is currently on eligard. No other compalins today   PMH: Past Medical History:  Diagnosis Date   COPD (chronic obstructive pulmonary disease) ([place])    Emphysema    History of prostate cancer    Hypoxemia    Pulmonary hypertension (HCC)    PVC (premature ventricular contraction)     Surgical History: Past Surgical History:  Procedure Laterality Date   CYSTECTOMY     PROSTATECTOMY      Home Medications:  Allergies as of 12/07/2021       Reactions   Neomycin-bacitracin Zn-polymyx    REACTION: rash        Medication List        Accurate as of December 07, 2021  2:37 PM. If you have any questions, ask your nurse or doctor.          albuterol (2.5 MG/3ML) 0.083% nebulizer solution Commonly known as: PROVENTIL Take 2.5 mg by nebulization every 4 (four) hours as needed.   atenolol 50 MG tablet Commonly known as: TENORMIN Take 50 mg by mouth daily.   docusate sodium 100 MG capsule Commonly known as: COLACE Take 100 mg by mouth 2 (two) times daily.   famotidine 20 MG tablet Commonly known as: PEPCID Take 20 mg by mouth 2 (two) times daily.   Fluticasone-Salmeterol 250-50 MCG/DOSE Aepb Commonly known as: ADVAIR Inhale 1 puff into the lungs every 12 (twelve) hours.   glimepiride 1 MG tablet Commonly known as: AMARYL Take 1 mg by mouth daily before breakfast.   HM Senna 8.6 MG tablet Generic drug: senna Take 2 tablets by mouth 2 (two) times daily.   Linzess 72 MCG capsule Generic drug: linaclotide Take 72 mcg by mouth daily.   OXYGEN Inhale 3.5 L into the lungs.   sodium chloride (hypertonic) 3 %  solution SMARTSIG:3 Milliliter(s) Via Nebulizer Twice Daily PRN   telmisartan-hydrochlorothiazide 80-25 MG tablet Commonly known as: MICARDIS HCT Take 1 tablet by mouth daily.   tiotropium 18 MCG inhalation capsule Commonly known as: SPIRIVA Place 18 mcg into inhaler and inhale daily.   Vitamin D-3 125 MCG (5000 UT) Tabs Take 1 tablet by mouth daily.        Allergies:  Allergies  Allergen Reactions   Neomycin-Bacitracin Zn-Polymyx     REACTION: rash    Family History: Family History  Problem Relation Age of Onset   Sarcoidosis Brother    Heart disease Father    Rheum arthritis Sister    Rheum arthritis Sister    Rheum arthritis Father    Cancer Mother     Social History:  reports that he quit smoking about 39 years ago. His smoking use included cigarettes. He has a 20.00 pack-year smoking history. He has never used smokeless tobacco. He reports that he does not drink alcohol and does not use drugs.  ROS: All other review of systems were reviewed and are negative except what is noted above in HPI  Physical Exam: BP 115/64   Pulse 69   Constitutional:  Alert and oriented, No acute distress. HEENT: [place] AT, moist mucus membranes.  Trachea midline, no masses. Cardiovascular: No clubbing, cyanosis, or edema. Respiratory:  Normal respiratory effort, no increased work of breathing. GI: Abdomen is soft, nontender, nondistended, no abdominal masses GU: No CVA tenderness.  Lymph: No cervical or inguinal lymphadenopathy. Skin: No rashes, bruises or suspicious lesions. Neurologic: Grossly intact, no focal deficits, moving all 4 extremities. Psychiatric: Normal mood and affect.  Laboratory Data: Lab Results  Component Value Date   WBC 7.2 10/23/2006   HGB 9.7 (L) 10/23/2006   HCT 29.7 (L) 10/23/2006   MCV 88.3 10/23/2006   PLT 353 10/23/2006    Lab Results  Component Value Date   CREATININE 1.10 10/29/2020    Lab Results  Component Value Date   PSA 0.3  09/30/2019   PSA 0.3 06/24/2019   PSA 0.4 03/26/2019    No results found for: "TESTOSTERONE"  No results found for: "HGBA1C"  Urinalysis    Component Value Date/Time   COLORURINE YELLOW 10/14/2006 1821   APPEARANCEUR Clear 04/15/2020 1358   LABSPEC 1.010 10/14/2006 1821   PHURINE 5.5 10/14/2006 1821   GLUCOSEU Negative 04/15/2020 1358   HGBUR TRACE (A) 10/14/2006 1821   BILIRUBINUR Negative 04/15/2020 1358   [place] 10/14/2006 1821   PROTEINUR Negative 04/15/2020 1358   PROTEINUR NEGATIVE 10/14/2006 1821   UROBILINOGEN 0.2 10/14/2006 1821   NITRITE Negative 04/15/2020 1358   NITRITE NEGATIVE 10/14/2006 1821   LEUKOCYTESUR Trace (A) 04/15/2020 1358    Lab Results  Component Value Date   LABMICR See below: 04/15/2020   WBCUA 0-5 04/15/2020   LABEPIT 0-10 04/15/2020   BACTERIA None seen 04/15/2020    Pertinent Imaging:  No results found for this or any previous visit.  No results found for this or any previous visit.  No results found for this or any previous visit.  No results found for this or any previous visit.  No results found for this or any previous visit.  No valid procedures specified. No results found for this or any previous visit.  No results found for this or any previous visit.   Assessment & Plan:    1. Prostate cancer ([place]) -We discussed adding an androgen receptor blocker to his current therapy, doxetaxal therapy, and continuing current ADT regiment. After discussing the options the patient elects for xtandi therapy. RTC 2 months with PSA - Urinalysis, Routine w reflex microscopic   No follow-ups on file.  Nicolette Bang, MD  [facility] [place]  n

## 2021-12-09 ENCOUNTER — Encounter: Payer: Self-pay | Admitting: Urology

## 2021-12-09 NOTE — Patient Instructions (Signed)

## 2021-12-15 DIAGNOSIS — H35033 Hypertensive retinopathy, bilateral: Secondary | ICD-10-CM | POA: Diagnosis not present

## 2021-12-15 DIAGNOSIS — H524 Presbyopia: Secondary | ICD-10-CM | POA: Diagnosis not present

## 2021-12-19 ENCOUNTER — Telehealth: Payer: Self-pay

## 2021-12-19 NOTE — Telephone Encounter (Signed)
Pharmacy called to request PA be completed for the xtandi rx.  PA completed and approved on 12/19/2021

## 2022-01-18 ENCOUNTER — Other Ambulatory Visit: Payer: Medicare Other

## 2022-01-18 DIAGNOSIS — C775 Secondary and unspecified malignant neoplasm of intrapelvic lymph nodes: Secondary | ICD-10-CM

## 2022-01-18 DIAGNOSIS — C61 Malignant neoplasm of prostate: Secondary | ICD-10-CM | POA: Diagnosis not present

## 2022-01-19 LAB — PSA: Prostate Specific Ag, Serum: 1.1 ng/mL (ref 0.0–4.0)

## 2022-01-23 IMAGING — CT CT ABD-PELV W/ CM
2 of 5 series · 16 of 46 positions shown, 18 images · IV contrast (omnipaque)
Comparison: None.

CLINICAL DATA: Biochemical recurrence of prostate carcinoma.
Previous prostatectomy.

EXAM:
CT ABDOMEN AND PELVIS WITH CONTRAST
TECHNIQUE: Multidetector CT imaging of the abdomen and pelvis was performed
using the standard protocol following bolus administration of
intravenous contrast.
CONTRAST:  100mL OMNIPAQUE IOHEXOL 350 MG/ML SOLN

[Series 2: axial st · axial · 0.90mm/px · z∈[+1282,+1657]mm · 13 of 89 slices shown, 15 images]
[im 7/89  soft-tissue]
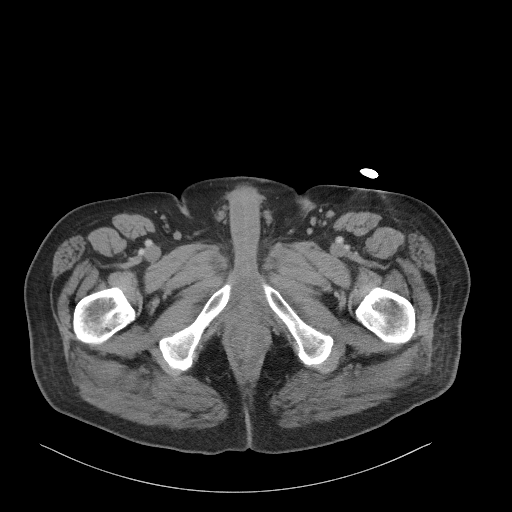
[im 7/89  bone]
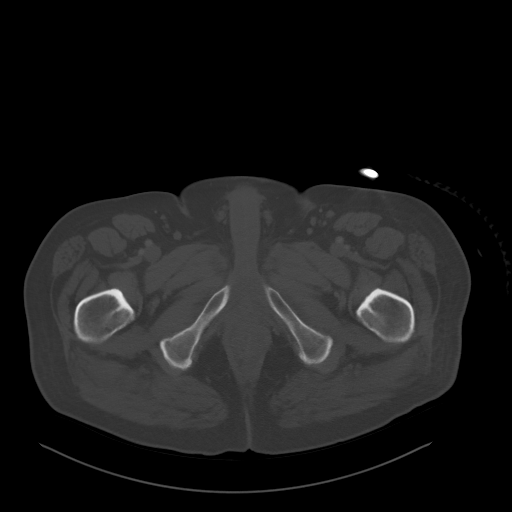
[im 13/89  soft-tissue]
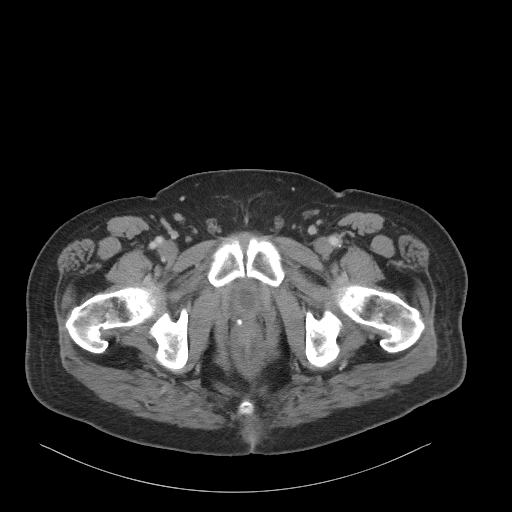
[im 19/89  soft-tissue]
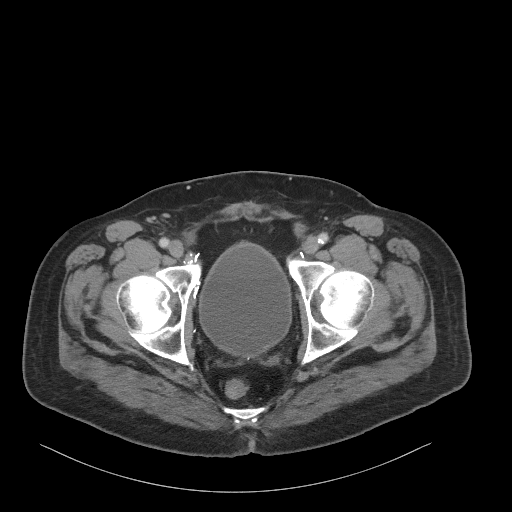
[im 26/89  soft-tissue]
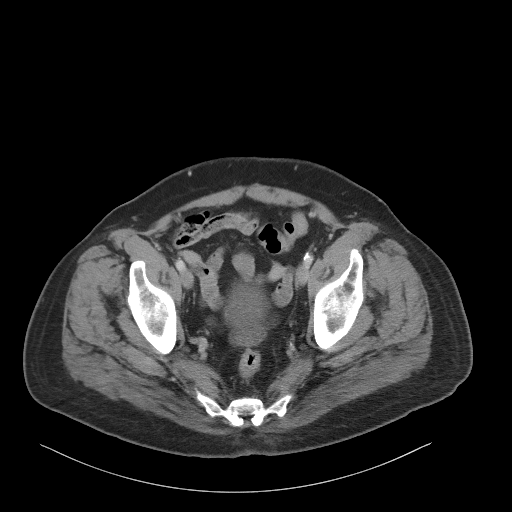
[im 32/89  soft-tissue]
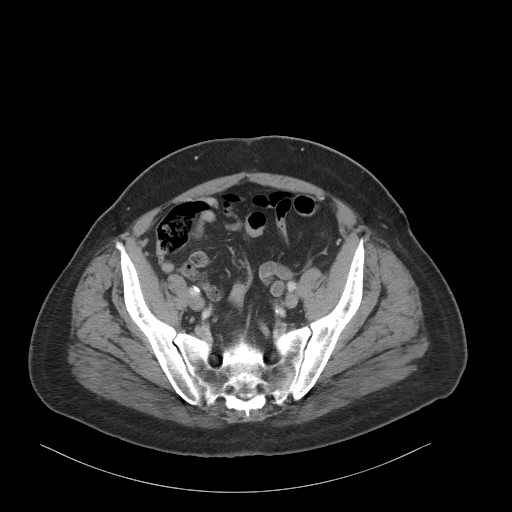
[im 38/89  soft-tissue]
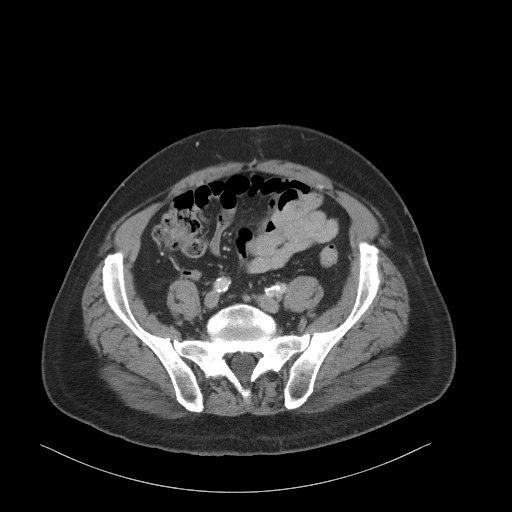
[im 45/89  soft-tissue]
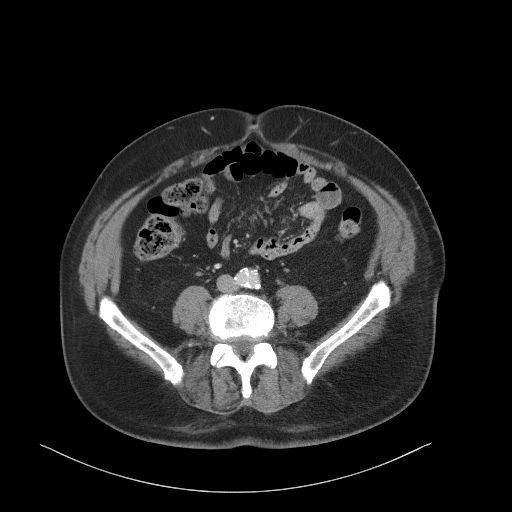
[im 51/89  soft-tissue]
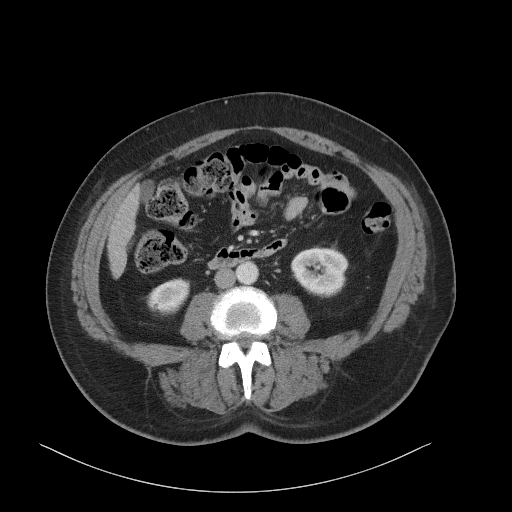
[im 57/89  soft-tissue]
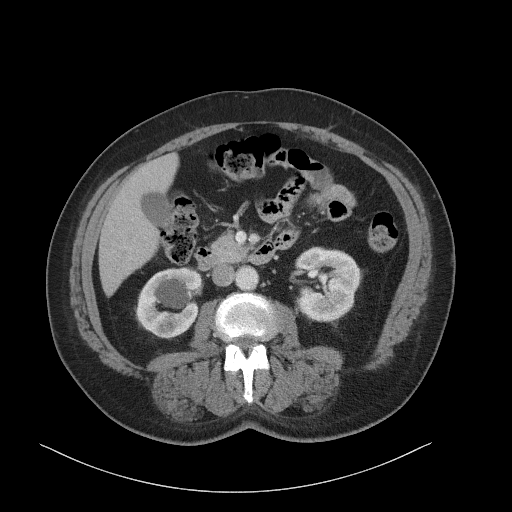
[im 57/89  bone]
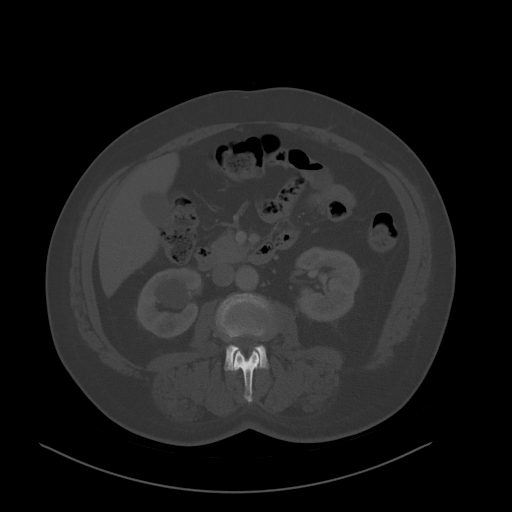
[im 63/89  soft-tissue]
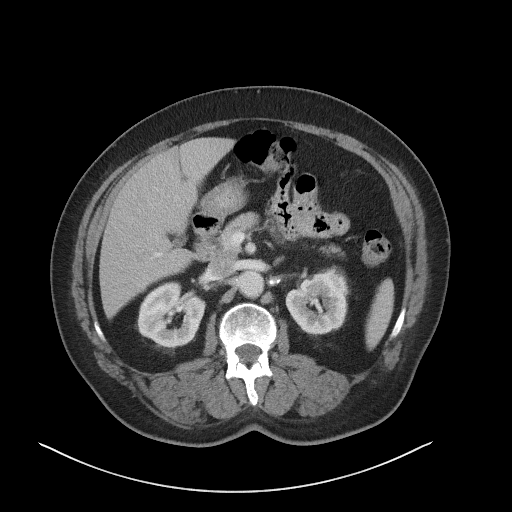
[im 70/89  soft-tissue]
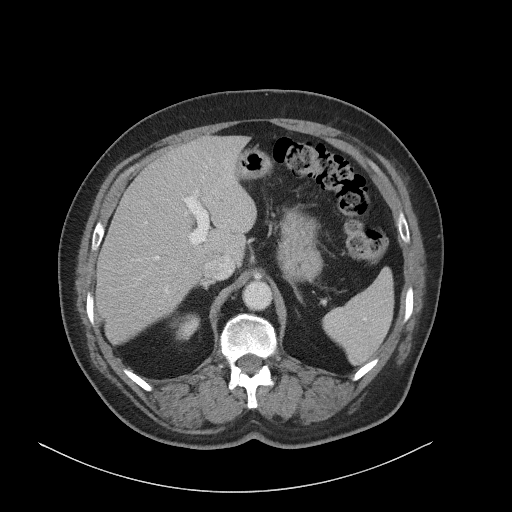
[im 76/89  soft-tissue]
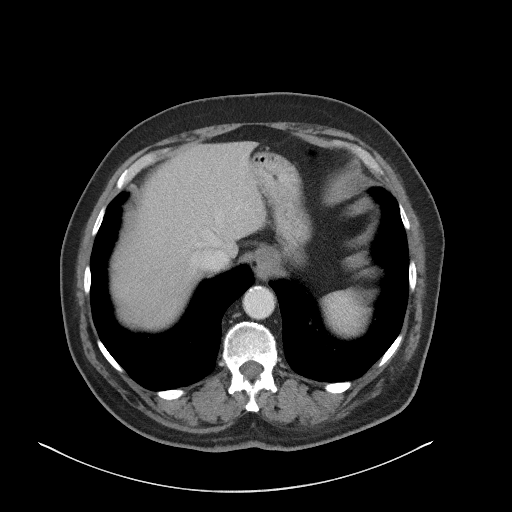
[im 82/89  soft-tissue]
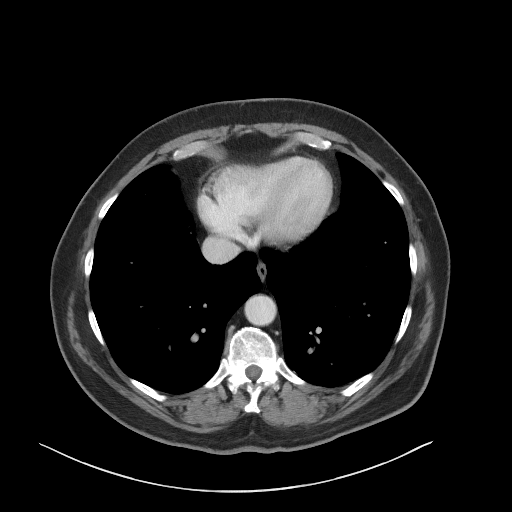

[Series 5: coronal st · coronal · 0.70mm/px · 3 of 101 slices shown]
[im 34/101  soft-tissue]
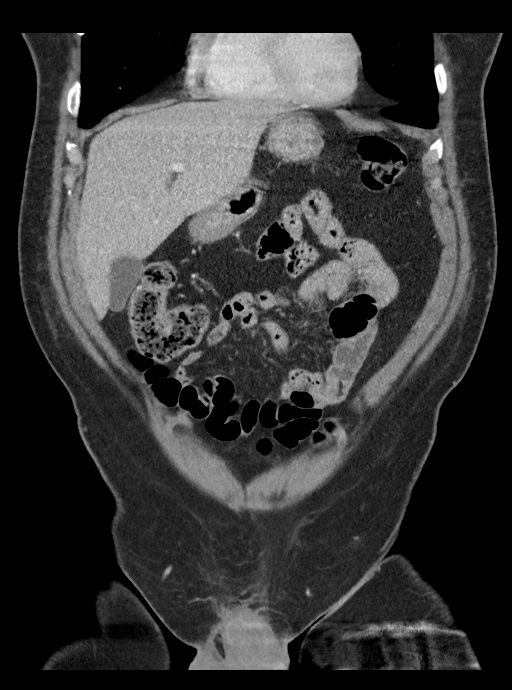
[im 45/101  soft-tissue]
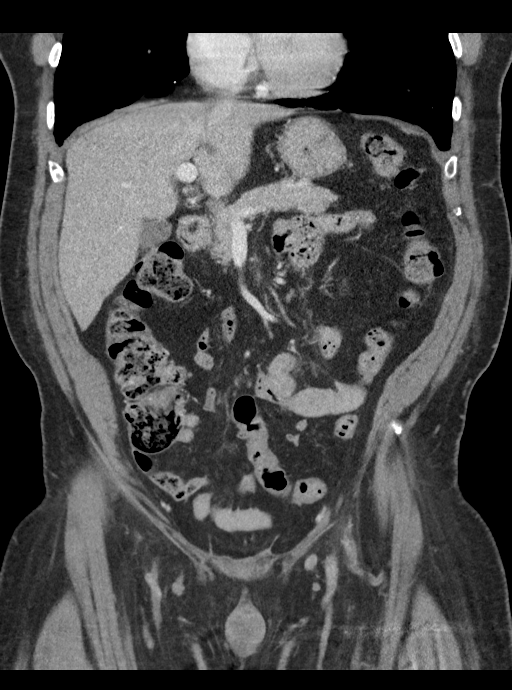
[im 56/101  soft-tissue]
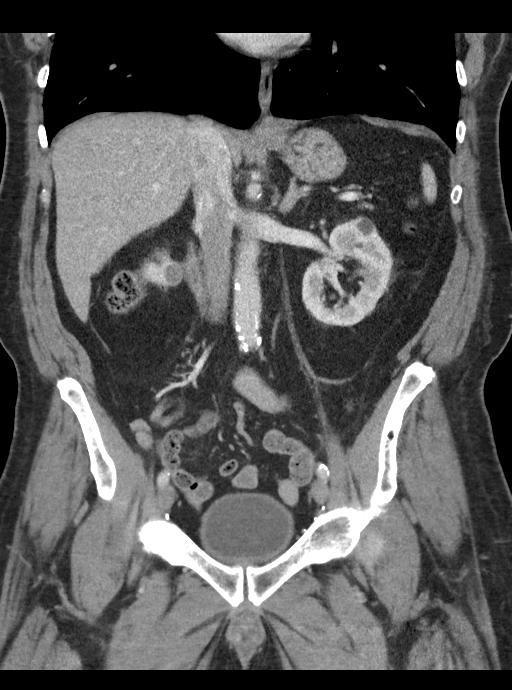

[16 of 46 positions shown; findings below may reference images not displayed]

FINDINGS: Lower Chest: No acute findings.

Hepatobiliary: Tiny sub-cm low-attenuation lesion is seen in segment
4B which is too small to characterize but most likely represents a
tiny cyst or hemangioma. Focal fatty infiltration is seen adjacent
to the falciform ligament. No suspicious liver lesions identified.
Gallbladder is unremarkable. No evidence of biliary ductal
dilatation.

Pancreas:  No mass or inflammatory changes.

Spleen: Within normal limits in size and appearance.

Adrenals/Urinary Tract: No masses identified. A few renal cysts are
noted bilaterally. No evidence of ureteral calculi or
hydronephrosis. Unremarkable unopacified urinary bladder.

Stomach/Bowel: No evidence of obstruction, inflammatory process or
abnormal fluid collections. Normal appendix visualized.

Vascular/Lymphatic: No pathologically enlarged lymph nodes. No acute
vascular findings.

Reproductive: Postop changes are seen from radical prostatectomy. No
abnormal soft tissue masses identified.

Other:  None.

Musculoskeletal:  No suspicious bone lesions identified.
IMPRESSION: No acute findings. No evidence of recurrent or metastatic carcinoma
within the abdomen or pelvis.

## 2022-01-25 ENCOUNTER — Ambulatory Visit (INDEPENDENT_AMBULATORY_CARE_PROVIDER_SITE_OTHER): Payer: Medicare Other | Admitting: Urology

## 2022-01-25 VITALS — BP 138/66 | HR 70

## 2022-01-25 DIAGNOSIS — C61 Malignant neoplasm of prostate: Secondary | ICD-10-CM

## 2022-01-25 DIAGNOSIS — C775 Secondary and unspecified malignant neoplasm of intrapelvic lymph nodes: Secondary | ICD-10-CM | POA: Diagnosis not present

## 2022-01-25 MED ORDER — LEUPROLIDE ACETATE (3 MONTH) 22.5 MG IM KIT
22.5000 mg | PACK | Freq: Once | INTRAMUSCULAR | Status: AC
  Administered 2022-01-25: 22.5 mg via INTRAMUSCULAR

## 2022-01-25 NOTE — Progress Notes (Signed)
01/25/2022 3:04 PM   Barnie Alderman Oct 13, 1933 [id number]  Referring provider: Neale Burly, MD [address] [place],  [place] [address]  Followup prostate cancer   HPI: Mr Azimi is a 88yo here fro followup for metastatic prostate cancer. PSA decreased to 1.1 from 2.1 with the addition of xtandi. Mild hot flashes. He was initially fatigued when starting xtandi but that has improved. NO worsening LUTS. No other complaints today   PMH: Past Medical History:  Diagnosis Date   COPD (chronic obstructive pulmonary disease) ([place])    Emphysema    History of prostate cancer    Hypoxemia    Pulmonary hypertension (HCC)    PVC (premature ventricular contraction)     Surgical History: Past Surgical History:  Procedure Laterality Date   CYSTECTOMY     PROSTATECTOMY      Home Medications:  Allergies as of 01/25/2022       Reactions   Neomycin-bacitracin Zn-polymyx    REACTION: rash        Medication List        Accurate as of January 25, 2022  3:04 PM. If you have any questions, ask your nurse or doctor.          albuterol (2.5 MG/3ML) 0.083% nebulizer solution Commonly known as: PROVENTIL Take 2.5 mg by nebulization every 4 (four) hours as needed.   atenolol 50 MG tablet Commonly known as: TENORMIN Take 50 mg by mouth daily.   docusate sodium 100 MG capsule Commonly known as: COLACE Take 100 mg by mouth 2 (two) times daily.   enzalutamide 80 MG tablet Commonly known as: Xtandi Take 2 tablets (160 mg total) by mouth daily.   famotidine 20 MG tablet Commonly known as: PEPCID Take 20 mg by mouth 2 (two) times daily.   Fluticasone-Salmeterol 250-50 MCG/DOSE Aepb Commonly known as: ADVAIR Inhale 1 puff into the lungs every 12 (twelve) hours.   glimepiride 1 MG tablet Commonly known as: AMARYL Take 1 mg by mouth daily before breakfast.   HM Senna 8.6 MG tablet Generic drug: senna Take 2 tablets by mouth 2 (two) times daily.   Linzess 72 MCG  capsule Generic drug: linaclotide Take 72 mcg by mouth daily.   OXYGEN Inhale 3.5 L into the lungs.   sodium chloride (hypertonic) 3 % solution SMARTSIG:3 Milliliter(s) Via Nebulizer Twice Daily PRN   telmisartan-hydrochlorothiazide 80-25 MG tablet Commonly known as: MICARDIS HCT Take 1 tablet by mouth daily.   tiotropium 18 MCG inhalation capsule Commonly known as: SPIRIVA Place 18 mcg into inhaler and inhale daily.   Vitamin D-3 125 MCG (5000 UT) Tabs Take 1 tablet by mouth daily.        Allergies:  Allergies  Allergen Reactions   Neomycin-Bacitracin Zn-Polymyx     REACTION: rash    Family History: Family History  Problem Relation Age of Onset   Sarcoidosis Brother    Heart disease Father    Rheum arthritis Sister    Rheum arthritis Sister    Rheum arthritis Father    Cancer Mother     Social History:  reports that he quit smoking about 39 years ago. His smoking use included cigarettes. He has a 20.00 pack-year smoking history. He has never used smokeless tobacco. He reports that he does not drink alcohol and does not use drugs.  ROS: All other review of systems were reviewed and are negative except what is noted above in HPI  Physical Exam: BP 138/66  Pulse 70   Constitutional:  Alert and oriented, No acute distress. HEENT: [place] AT, moist mucus membranes.  Trachea midline, no masses. Cardiovascular: No clubbing, cyanosis, or edema. Respiratory: Normal respiratory effort, no increased work of breathing. GI: Abdomen is soft, nontender, nondistended, no abdominal masses GU: No CVA tenderness.  Lymph: No cervical or inguinal lymphadenopathy. Skin: No rashes, bruises or suspicious lesions. Neurologic: Grossly intact, no focal deficits, moving all 4 extremities. Psychiatric: Normal mood and affect.  Laboratory Data: Lab Results  Component Value Date   WBC 7.2 10/23/2006   HGB 9.7 (L) 10/23/2006   HCT 29.7 (L) 10/23/2006   MCV 88.3 10/23/2006   PLT 353  10/23/2006    Lab Results  Component Value Date   CREATININE 1.10 10/29/2020    Lab Results  Component Value Date   PSA 0.3 09/30/2019   PSA 0.3 06/24/2019   PSA 0.4 03/26/2019    No results found for: "TESTOSTERONE"  No results found for: "HGBA1C"  Urinalysis    Component Value Date/Time   COLORURINE YELLOW 10/14/2006 1821   APPEARANCEUR Clear 04/15/2020 1358   LABSPEC 1.010 10/14/2006 1821   PHURINE 5.5 10/14/2006 1821   GLUCOSEU Negative 04/15/2020 1358   HGBUR TRACE (A) 10/14/2006 1821   BILIRUBINUR Negative 04/15/2020 1358   [place] 10/14/2006 1821   PROTEINUR Negative 04/15/2020 1358   PROTEINUR NEGATIVE 10/14/2006 1821   UROBILINOGEN 0.2 10/14/2006 1821   NITRITE Negative 04/15/2020 1358   NITRITE NEGATIVE 10/14/2006 1821   LEUKOCYTESUR Trace (A) 04/15/2020 1358    Lab Results  Component Value Date   LABMICR See below: 04/15/2020   WBCUA 0-5 04/15/2020   LABEPIT 0-10 04/15/2020   BACTERIA None seen 04/15/2020    Pertinent Imaging:  No results found for this or any previous visit.  No results found for this or any previous visit.  No results found for this or any previous visit.  No results found for this or any previous visit.  No results found for this or any previous visit.  No valid procedures specified. No results found for this or any previous visit.  No results found for this or any previous visit.   Assessment & Plan:    1. Prostate cancer metastatic to intrapelvic lymph node ([place]) -Continue xtandi. Followup 3 months with PSA - leuprolide (LUPRON) injection 22.5 mg - Urinalysis, Routine w reflex microscopic   No follow-ups on file.  Nicolette Bang, MD  [facility] [place]

## 2022-01-25 NOTE — Progress Notes (Signed)
Lupron IM Injection   Due to Prostate Cancer patient is present today for a Lupron Injection.  Medication: Lupron 3 month Dose: 22.5 mg  Location: right upper outer buttocks Lot: [id number] Exp: 05/10/2023  Patient tolerated well, no complications were noted  Performed by: Levi Aland, CMA  Follow up: Follow up as scheduled.

## 2022-01-25 NOTE — Patient Instructions (Signed)

## 2022-02-05 ENCOUNTER — Encounter: Payer: Self-pay | Admitting: Urology

## 2022-02-15 DIAGNOSIS — E1169 Type 2 diabetes mellitus with other specified complication: Secondary | ICD-10-CM | POA: Diagnosis not present

## 2022-02-15 DIAGNOSIS — K5900 Constipation, unspecified: Secondary | ICD-10-CM | POA: Diagnosis not present

## 2022-02-15 DIAGNOSIS — N182 Chronic kidney disease, stage 2 (mild): Secondary | ICD-10-CM | POA: Diagnosis not present

## 2022-02-15 DIAGNOSIS — I10 Essential (primary) hypertension: Secondary | ICD-10-CM | POA: Diagnosis not present

## 2022-03-30 ENCOUNTER — Telehealth: Payer: Self-pay

## 2022-03-30 NOTE — Telephone Encounter (Signed)
Patient called requesting a PA renewal for Xtandi.  Renewal completed and sent to pharmacy.

## 2022-04-03 ENCOUNTER — Telehealth: Payer: Self-pay

## 2022-04-03 NOTE — Telephone Encounter (Signed)
Patient call to get up dates on his PA. Patient states his receive an approval letter for 2024 for Xtandi. Made patient aware to contact his pharmacy with the approval code and the pharmacy should be able fill his medication. Patient voiced understanding.

## 2022-04-05 ENCOUNTER — Other Ambulatory Visit: Payer: Self-pay

## 2022-04-05 ENCOUNTER — Telehealth: Payer: Self-pay

## 2022-04-05 MED ORDER — ENZALUTAMIDE 80 MG PO TABS: 160.0000 mg | ORAL_TABLET | Freq: Every day | ORAL | 11 refills | Status: DC

## 2022-04-05 NOTE — Telephone Encounter (Signed)
Return call to patient. Patient voiced that his medication had to been sent to a specialty pharmacy. Made patient aware that prescription was to his specialty pharmacy. Made patient aware to contact me back and let me know if that help or if we need to complete another PA. Patient voiced understanding.

## 2022-04-05 NOTE — Telephone Encounter (Signed)
Patient call in today concern about a medication drug program. Made patient aware that I would look into this and give him a call back. patient voiced understanding

## 2022-04-18 ENCOUNTER — Other Ambulatory Visit: Payer: Medicare Other

## 2022-04-18 ENCOUNTER — Other Ambulatory Visit: Payer: Self-pay

## 2022-04-18 DIAGNOSIS — C775 Secondary and unspecified malignant neoplasm of intrapelvic lymph nodes: Secondary | ICD-10-CM | POA: Diagnosis not present

## 2022-04-18 DIAGNOSIS — C61 Malignant neoplasm of prostate: Secondary | ICD-10-CM

## 2022-04-19 LAB — COMPREHENSIVE METABOLIC PANEL
ALT: 7 IU/L (ref 0–44)
AST: 13 IU/L (ref 0–40)
Albumin/Globulin Ratio: 1.8 (ref 1.2–2.2)
Albumin: 4.2 g/dL (ref 3.7–4.7)
Alkaline Phosphatase: 49 IU/L (ref 44–121)
BUN/Creatinine Ratio: 15 (ref 10–24)
BUN: 20 mg/dL (ref 8–27)
Bilirubin Total: <0.2 mg/dL (ref 0.0–1.2)
CO2: 29 mmol/L (ref 20–29)
Calcium: 10.8 mg/dL — ABNORMAL HIGH (ref 8.6–10.2)
Chloride: 99 mmol/L (ref 96–106)
Creatinine, Ser: 1.31 mg/dL — ABNORMAL HIGH (ref 0.76–1.27)
Globulin, Total: 2.3 g/dL (ref 1.5–4.5)
Glucose: 105 mg/dL — ABNORMAL HIGH (ref 70–99)
Potassium: 4.8 mmol/L (ref 3.5–5.2)
Sodium: 143 mmol/L (ref 134–144)
Total Protein: 6.5 g/dL (ref 6.0–8.5)
eGFR: 53 mL/min/{1.73_m2} — ABNORMAL LOW (ref >59)

## 2022-04-19 LAB — PSA: Prostate Specific Ag, Serum: 0.6 ng/mL (ref 0.0–4.0)

## 2022-04-25 ENCOUNTER — Ambulatory Visit (INDEPENDENT_AMBULATORY_CARE_PROVIDER_SITE_OTHER): Payer: Medicare Other | Admitting: Urology

## 2022-04-25 ENCOUNTER — Encounter: Payer: Self-pay | Admitting: Urology

## 2022-04-25 VITALS — BP 130/73 | HR 98

## 2022-04-25 DIAGNOSIS — C61 Malignant neoplasm of prostate: Secondary | ICD-10-CM | POA: Diagnosis not present

## 2022-04-25 DIAGNOSIS — C775 Secondary and unspecified malignant neoplasm of intrapelvic lymph nodes: Secondary | ICD-10-CM

## 2022-04-25 MED ORDER — LEUPROLIDE ACETATE (3 MONTH) 22.5 MG IM KIT
22.5000 mg | PACK | Freq: Once | INTRAMUSCULAR | Status: AC
  Administered 2022-04-25: 22.5 mg via INTRAMUSCULAR

## 2022-04-25 NOTE — Patient Instructions (Signed)

## 2022-04-25 NOTE — Progress Notes (Unsigned)
04/25/2022 2:20 PM   Barnie Alderman [birth year] [id number]  Referring provider: Neale Burly, MD [address] [place],  [place] [id number]  Followup prostate cancer   HPI: Mr Roshell is a [id number] here for followup for prostate cancer. He had a mild fatigue increase with xtandi. PSA decreased to 0.6 from 1.1. No significant LUTS. No significant incontinence. Mild hot flashes.    PMH: Past Medical History:  Diagnosis Date   COPD (chronic obstructive pulmonary disease) ([place])    Emphysema    History of prostate cancer    Hypoxemia    Pulmonary hypertension (HCC)    PVC (premature ventricular contraction)     Surgical History: Past Surgical History:  Procedure Laterality Date   CYSTECTOMY     PROSTATECTOMY      Home Medications:  Allergies as of 04/25/2022       Reactions   Neomycin-bacitracin Zn-polymyx    REACTION: rash        Medication List        Accurate as of April 25, 2022  2:20 PM. If you have any questions, ask your nurse or doctor.          albuterol (2.5 MG/3ML) 0.083% nebulizer solution Commonly known as: PROVENTIL Take 2.5 mg by nebulization every 4 (four) hours as needed.   atenolol 50 MG tablet Commonly known as: TENORMIN Take 50 mg by mouth daily.   docusate sodium 100 MG capsule Commonly known as: COLACE Take 100 mg by mouth 2 (two) times daily.   enzalutamide 80 MG tablet Commonly known as: Xtandi Take 2 tablets (160 mg total) by mouth daily.   famotidine 20 MG tablet Commonly known as: PEPCID Take 20 mg by mouth 2 (two) times daily.   Fluticasone-Salmeterol 250-50 MCG/DOSE Aepb Commonly known as: ADVAIR Inhale 1 puff into the lungs every 12 (twelve) hours.   glimepiride 1 MG tablet Commonly known as: AMARYL Take 1 mg by mouth daily before breakfast.   HM Senna 8.6 MG tablet Generic drug: senna Take 2 tablets by mouth 2 (two) times daily.   Linzess 72 MCG capsule Generic drug: linaclotide Take 72 mcg by mouth  daily.   OXYGEN Inhale 3.5 L into the lungs.   sodium chloride (hypertonic) 3 % solution SMARTSIG:3 Milliliter(s) Via Nebulizer Twice Daily PRN   telmisartan-hydrochlorothiazide 80-25 MG tablet Commonly known as: MICARDIS HCT Take 1 tablet by mouth daily.   tiotropium 18 MCG inhalation capsule Commonly known as: SPIRIVA Place 18 mcg into inhaler and inhale daily.   Vitamin D-3 125 MCG (5000 UT) Tabs Take 1 tablet by mouth daily.        Allergies:  Allergies  Allergen Reactions   Neomycin-Bacitracin Zn-Polymyx     REACTION: rash    Family History: Family History  Problem Relation Age of Onset   Sarcoidosis Brother    Heart disease Father    Rheum arthritis Sister    Rheum arthritis Sister    Rheum arthritis Father    Cancer Mother     Social History:  reports that he quit smoking about 40 years ago. His smoking use included cigarettes. He has a 20.00 pack-year smoking history. He has never used smokeless tobacco. He reports that he does not drink alcohol and does not use drugs.  ROS: All other review of systems were reviewed and are negative except what is noted above in HPI  Physical Exam: BP 130/73   Pulse 98   Constitutional:  Alert and oriented,  No acute distress. HEENT: [place] AT, moist mucus membranes.  Trachea midline, no masses. Cardiovascular: No clubbing, cyanosis, or edema. Respiratory: Normal respiratory effort, no increased work of breathing. GI: Abdomen is soft, nontender, nondistended, no abdominal masses GU: No CVA tenderness.  Lymph: No cervical or inguinal lymphadenopathy. Skin: No rashes, bruises or suspicious lesions. Neurologic: Grossly intact, no focal deficits, moving all 4 extremities. Psychiatric: Normal mood and affect.  Laboratory Data: Lab Results  Component Value Date   WBC 7.2 10/23/2006   HGB 9.7 (L) 10/23/2006   HCT 29.7 (L) 10/23/2006   MCV 88.3 10/23/2006   PLT 353 10/23/2006    Lab Results  Component Value Date    CREATININE 1.31 (H) 04/18/2022    Lab Results  Component Value Date   PSA 0.3 09/30/2019   PSA 0.3 06/24/2019   PSA 0.4 03/26/2019    No results found for: "TESTOSTERONE"  No results found for: "HGBA1C"  Urinalysis    Component Value Date/Time   COLORURINE YELLOW 10/14/2006 1821   APPEARANCEUR Clear 04/15/2020 1358   LABSPEC 1.010 10/14/2006 1821   PHURINE 5.5 10/14/2006 1821   GLUCOSEU Negative 04/15/2020 1358   HGBUR TRACE (A) 10/14/2006 1821   BILIRUBINUR Negative 04/15/2020 1358   [place] 10/14/2006 1821   PROTEINUR Negative 04/15/2020 1358   PROTEINUR NEGATIVE 10/14/2006 1821   UROBILINOGEN 0.2 10/14/2006 1821   NITRITE Negative 04/15/2020 1358   NITRITE NEGATIVE 10/14/2006 1821   LEUKOCYTESUR Trace (A) 04/15/2020 1358    Lab Results  Component Value Date   LABMICR See below: 04/15/2020   WBCUA 0-5 04/15/2020   LABEPIT 0-10 04/15/2020   BACTERIA None seen 04/15/2020    Pertinent Imaging: *** No results found for this or any previous visit.  No results found for this or any previous visit.  No results found for this or any previous visit.  No results found for this or any previous visit.  No results found for this or any previous visit.  No valid procedures specified. No results found for this or any previous visit.  No results found for this or any previous visit.   Assessment & Plan:    1. Prostate cancer metastatic to intrapelvic lymph node ([facility]) -followup 3 months with PSA - Urinalysis, Routine w reflex microscopic - leuprolide (LUPRON) injection 22.5 mg   No follow-ups on file.  Nicolette Bang, MD  [facility] [place]

## 2022-05-17 DIAGNOSIS — C799 Secondary malignant neoplasm of unspecified site: Secondary | ICD-10-CM | POA: Diagnosis not present

## 2022-05-17 DIAGNOSIS — E1169 Type 2 diabetes mellitus with other specified complication: Secondary | ICD-10-CM | POA: Diagnosis not present

## 2022-05-17 DIAGNOSIS — I10 Essential (primary) hypertension: Secondary | ICD-10-CM | POA: Diagnosis not present

## 2022-05-17 DIAGNOSIS — K5900 Constipation, unspecified: Secondary | ICD-10-CM | POA: Diagnosis not present

## 2022-06-05 DIAGNOSIS — M810 Age-related osteoporosis without current pathological fracture: Secondary | ICD-10-CM | POA: Diagnosis not present

## 2022-06-05 DIAGNOSIS — M8589 Other specified disorders of bone density and structure, multiple sites: Secondary | ICD-10-CM | POA: Diagnosis not present

## 2022-07-11 DIAGNOSIS — G40909 Epilepsy, unspecified, not intractable, without status epilepticus: Secondary | ICD-10-CM | POA: Diagnosis not present

## 2022-07-11 DIAGNOSIS — J449 Chronic obstructive pulmonary disease, unspecified: Secondary | ICD-10-CM | POA: Diagnosis not present

## 2022-07-11 DIAGNOSIS — E1121 Type 2 diabetes mellitus with diabetic nephropathy: Secondary | ICD-10-CM | POA: Diagnosis not present

## 2022-07-11 DIAGNOSIS — J9612 Chronic respiratory failure with hypercapnia: Secondary | ICD-10-CM | POA: Diagnosis not present

## 2022-07-17 DIAGNOSIS — R410 Disorientation, unspecified: Secondary | ICD-10-CM | POA: Diagnosis not present

## 2022-07-17 DIAGNOSIS — R569 Unspecified convulsions: Secondary | ICD-10-CM | POA: Diagnosis not present

## 2022-07-17 DIAGNOSIS — R29810 Facial weakness: Secondary | ICD-10-CM | POA: Diagnosis not present

## 2022-07-17 DIAGNOSIS — Z87891 Personal history of nicotine dependence: Secondary | ICD-10-CM | POA: Diagnosis not present

## 2022-07-17 DIAGNOSIS — R4182 Altered mental status, unspecified: Secondary | ICD-10-CM | POA: Diagnosis not present

## 2022-07-17 DIAGNOSIS — R55 Syncope and collapse: Secondary | ICD-10-CM | POA: Diagnosis not present

## 2022-07-17 DIAGNOSIS — I6389 Other cerebral infarction: Secondary | ICD-10-CM | POA: Diagnosis not present

## 2022-07-17 DIAGNOSIS — I959 Hypotension, unspecified: Secondary | ICD-10-CM | POA: Diagnosis not present

## 2022-07-17 DIAGNOSIS — R000 Tachycardia, unspecified: Secondary | ICD-10-CM | POA: Diagnosis not present

## 2022-07-18 DIAGNOSIS — R531 Weakness: Secondary | ICD-10-CM | POA: Diagnosis not present

## 2022-07-18 DIAGNOSIS — Z7984 Long term (current) use of oral hypoglycemic drugs: Secondary | ICD-10-CM | POA: Diagnosis not present

## 2022-07-18 DIAGNOSIS — J9611 Chronic respiratory failure with hypoxia: Secondary | ICD-10-CM | POA: Diagnosis not present

## 2022-07-18 DIAGNOSIS — Z9981 Dependence on supplemental oxygen: Secondary | ICD-10-CM | POA: Diagnosis not present

## 2022-07-18 DIAGNOSIS — I4510 Unspecified right bundle-branch block: Secondary | ICD-10-CM | POA: Diagnosis not present

## 2022-07-18 DIAGNOSIS — E1165 Type 2 diabetes mellitus with hyperglycemia: Secondary | ICD-10-CM | POA: Diagnosis not present

## 2022-07-18 DIAGNOSIS — R9082 White matter disease, unspecified: Secondary | ICD-10-CM | POA: Diagnosis not present

## 2022-07-18 DIAGNOSIS — R4182 Altered mental status, unspecified: Secondary | ICD-10-CM | POA: Diagnosis not present

## 2022-07-18 DIAGNOSIS — I071 Rheumatic tricuspid insufficiency: Secondary | ICD-10-CM | POA: Diagnosis not present

## 2022-07-18 DIAGNOSIS — N1832 Chronic kidney disease, stage 3b: Secondary | ICD-10-CM | POA: Diagnosis not present

## 2022-07-18 DIAGNOSIS — G40409 Other generalized epilepsy and epileptic syndromes, not intractable, without status epilepticus: Secondary | ICD-10-CM | POA: Diagnosis not present

## 2022-07-18 DIAGNOSIS — Z87891 Personal history of nicotine dependence: Secondary | ICD-10-CM | POA: Diagnosis not present

## 2022-07-18 DIAGNOSIS — Z79899 Other long term (current) drug therapy: Secondary | ICD-10-CM | POA: Diagnosis not present

## 2022-07-18 DIAGNOSIS — R569 Unspecified convulsions: Secondary | ICD-10-CM | POA: Diagnosis not present

## 2022-07-18 DIAGNOSIS — I129 Hypertensive chronic kidney disease with stage 1 through stage 4 chronic kidney disease, or unspecified chronic kidney disease: Secondary | ICD-10-CM | POA: Diagnosis not present

## 2022-07-18 DIAGNOSIS — N183 Chronic kidney disease, stage 3 unspecified: Secondary | ICD-10-CM | POA: Diagnosis not present

## 2022-07-18 DIAGNOSIS — I480 Paroxysmal atrial fibrillation: Secondary | ICD-10-CM | POA: Diagnosis not present

## 2022-07-18 DIAGNOSIS — E1122 Type 2 diabetes mellitus with diabetic chronic kidney disease: Secondary | ICD-10-CM | POA: Diagnosis not present

## 2022-07-18 DIAGNOSIS — Z8546 Personal history of malignant neoplasm of prostate: Secondary | ICD-10-CM | POA: Diagnosis not present

## 2022-07-18 DIAGNOSIS — J449 Chronic obstructive pulmonary disease, unspecified: Secondary | ICD-10-CM | POA: Diagnosis not present

## 2022-07-18 DIAGNOSIS — D649 Anemia, unspecified: Secondary | ICD-10-CM | POA: Diagnosis not present

## 2022-07-18 DIAGNOSIS — Z888 Allergy status to other drugs, medicaments and biological substances status: Secondary | ICD-10-CM | POA: Diagnosis not present

## 2022-07-19 ENCOUNTER — Other Ambulatory Visit: Payer: Medicare Other

## 2022-07-24 ENCOUNTER — Other Ambulatory Visit: Payer: Medicare Other

## 2022-07-26 ENCOUNTER — Ambulatory Visit: Payer: Medicare Other | Admitting: Urology

## 2022-08-09 DIAGNOSIS — I480 Paroxysmal atrial fibrillation: Secondary | ICD-10-CM | POA: Diagnosis not present

## 2022-08-09 DIAGNOSIS — Z6827 Body mass index (BMI) 27.0-27.9, adult: Secondary | ICD-10-CM | POA: Diagnosis not present

## 2022-08-09 DIAGNOSIS — G40909 Epilepsy, unspecified, not intractable, without status epilepticus: Secondary | ICD-10-CM | POA: Diagnosis not present

## 2022-08-14 DIAGNOSIS — I10 Essential (primary) hypertension: Secondary | ICD-10-CM | POA: Diagnosis not present

## 2022-08-14 DIAGNOSIS — Z1330 Encounter for screening examination for mental health and behavioral disorders, unspecified: Secondary | ICD-10-CM | POA: Diagnosis not present

## 2022-08-14 DIAGNOSIS — E785 Hyperlipidemia, unspecified: Secondary | ICD-10-CM | POA: Diagnosis not present

## 2022-08-14 DIAGNOSIS — I480 Paroxysmal atrial fibrillation: Secondary | ICD-10-CM | POA: Diagnosis not present

## 2022-08-14 DIAGNOSIS — J449 Chronic obstructive pulmonary disease, unspecified: Secondary | ICD-10-CM | POA: Diagnosis not present

## 2022-08-16 DIAGNOSIS — J449 Chronic obstructive pulmonary disease, unspecified: Secondary | ICD-10-CM | POA: Diagnosis not present

## 2022-08-16 DIAGNOSIS — E1121 Type 2 diabetes mellitus with diabetic nephropathy: Secondary | ICD-10-CM | POA: Diagnosis not present

## 2022-08-16 DIAGNOSIS — J9612 Chronic respiratory failure with hypercapnia: Secondary | ICD-10-CM | POA: Diagnosis not present

## 2022-08-16 DIAGNOSIS — G40909 Epilepsy, unspecified, not intractable, without status epilepticus: Secondary | ICD-10-CM | POA: Diagnosis not present

## 2022-08-16 DIAGNOSIS — I480 Paroxysmal atrial fibrillation: Secondary | ICD-10-CM | POA: Diagnosis not present

## 2022-08-16 DIAGNOSIS — Z0000 Encounter for general adult medical examination without abnormal findings: Secondary | ICD-10-CM | POA: Diagnosis not present

## 2022-08-17 DIAGNOSIS — Z5181 Encounter for therapeutic drug level monitoring: Secondary | ICD-10-CM | POA: Diagnosis not present

## 2022-08-24 DIAGNOSIS — J9611 Chronic respiratory failure with hypoxia: Secondary | ICD-10-CM | POA: Diagnosis not present

## 2022-08-24 DIAGNOSIS — Z2989 Encounter for other specified prophylactic measures: Secondary | ICD-10-CM | POA: Diagnosis not present

## 2022-08-24 DIAGNOSIS — R569 Unspecified convulsions: Secondary | ICD-10-CM | POA: Diagnosis not present

## 2022-08-28 DIAGNOSIS — I480 Paroxysmal atrial fibrillation: Secondary | ICD-10-CM | POA: Diagnosis not present

## 2022-09-01 ENCOUNTER — Other Ambulatory Visit: Payer: Medicare Other

## 2022-09-01 DIAGNOSIS — C61 Malignant neoplasm of prostate: Secondary | ICD-10-CM | POA: Diagnosis not present

## 2022-09-01 DIAGNOSIS — C775 Secondary and unspecified malignant neoplasm of intrapelvic lymph nodes: Secondary | ICD-10-CM | POA: Diagnosis not present

## 2022-09-02 LAB — PSA: Prostate Specific Ag, Serum: 0.4 ng/mL (ref 0.0–4.0)

## 2022-09-08 ENCOUNTER — Ambulatory Visit: Payer: Medicare Other | Admitting: Urology

## 2022-09-08 ENCOUNTER — Encounter: Payer: Self-pay | Admitting: Urology

## 2022-09-08 VITALS — BP 147/72 | HR 67

## 2022-09-08 DIAGNOSIS — C775 Secondary and unspecified malignant neoplasm of intrapelvic lymph nodes: Secondary | ICD-10-CM | POA: Diagnosis not present

## 2022-09-08 DIAGNOSIS — C61 Malignant neoplasm of prostate: Secondary | ICD-10-CM | POA: Diagnosis not present

## 2022-09-08 MED ORDER — LEUPROLIDE ACETATE (3 MONTH) 22.5 MG ~~LOC~~ KIT
22.5000 mg | PACK | SUBCUTANEOUS | Status: AC
Start: 2022-09-08 — End: ?
  Administered 2022-09-08 – 2023-12-21 (×2): 22.5 mg via SUBCUTANEOUS

## 2022-09-08 NOTE — Progress Notes (Unsigned)
09/08/2022 11:42 AM   Danny Martinez 07-17-1933 [id number]  Referring provider: Toma Deiters, MD [address] [place],  [place] [address]  No chief complaint on file.   HPI: PSA decreased to 0.4 from 0.6. 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    PMH: Past Medical History:  Diagnosis Date   COPD (chronic obstructive pulmonary disease) (HCC)    Emphysema    History of prostate cancer    Hypoxemia    Pulmonary hypertension (HCC)    PVC (premature ventricular contraction)     Surgical History: Past Surgical History:  Procedure Laterality Date   CYSTECTOMY     PROSTATECTOMY      Home Medications:  Allergies as of 09/08/2022       Reactions   Neomycin-bacitracin Zn-polymyx    REACTION: rash        Medication List        Accurate as of September 08, 2022 11:42 AM. If you have any questions, ask your nurse or doctor.          albuterol (2.5 MG/3ML) 0.083% nebulizer solution Commonly known as: PROVENTIL Take 2.5 mg by nebulization every 4 (four) hours as needed.   apixaban 5 MG Tabs tablet Commonly known as: ELIQUIS Take by mouth.   aspirin 81 MG chewable tablet Chew by mouth.   atenolol 50 MG tablet Commonly known as: TENORMIN Take 50 mg by mouth daily.   atorvastatin 20 MG tablet Commonly known as: LIPITOR Take by mouth.   docusate sodium 100 MG capsule Commonly known as: COLACE Take 100 mg by mouth 2 (two) times daily.   enzalutamide 80 MG tablet Commonly known as: Xtandi Take 2 tablets (160 mg total) by mouth daily.   famotidine 20 MG tablet Commonly known as: PEPCID Take 20 mg by mouth 2 (two) times daily.   Fluticasone-Salmeterol 250-50 MCG/DOSE Aepb Commonly known as: ADVAIR Inhale 1 puff into the lungs every 12 (twelve) hours.   glimepiride 1 MG tablet Commonly known as: AMARYL Take 1 mg by mouth daily before breakfast.   HM Senna 8.6 MG tablet Generic drug:  senna Take 2 tablets by mouth 2 (two) times daily.   levETIRAcetam 500 MG tablet Commonly known as: KEPPRA Take 500 mg by mouth 2 (two) times daily.   Linzess 72 MCG capsule Generic drug: linaclotide Take 72 mcg by mouth daily.   metoprolol tartrate 25 MG tablet Commonly known as: LOPRESSOR Take by mouth.   OXYGEN Inhale 3.5 L into the lungs.   Oyster Shell Calcium w/D 500-5 MG-MCG Tabs Take 1 tablet by mouth 2 (two) times daily.   sodium chloride (hypertonic) 3 % solution SMARTSIG:3 Milliliter(s) Via Nebulizer Twice Daily PRN   telmisartan-hydrochlorothiazide 80-25 MG tablet Commonly known as: MICARDIS HCT Take 1 tablet by mouth daily.   tiotropium 18 MCG inhalation capsule Commonly known as: SPIRIVA Place 18 mcg into inhaler and inhale daily.   Vitamin D-3 125 MCG (5000 UT) Tabs Take 1 tablet by mouth daily.        Allergies:  Allergies  Allergen Reactions   Neomycin-Bacitracin Zn-Polymyx     REACTION: rash    Family History: Family History  Problem Relation Age of Onset   Sarcoidosis Brother    Heart disease Father    Rheum arthritis Sister    Rheum arthritis Sister    Rheum arthritis Father    Cancer Mother  Social History:  reports that he quit smoking about 40 years ago. His smoking use included cigarettes. He has a 20.00 pack-year smoking history. He has never used smokeless tobacco. He reports that he does not drink alcohol and does not use drugs.  ROS: All other review of systems were reviewed and are negative except what is noted above in HPI  Physical Exam: BP (!) 153/80   Pulse 74   Constitutional:  Alert and oriented, No acute distress. HEENT: [place] AT, moist mucus membranes.  Trachea midline, no masses. Cardiovascular: No clubbing, cyanosis, or edema. Respiratory: Normal respiratory effort, no increased work of breathing. GI: Abdomen is soft, nontender, nondistended, no abdominal masses GU: No CVA tenderness.  Lymph: No cervical or  inguinal lymphadenopathy. Skin: No rashes, bruises or suspicious lesions. Neurologic: Grossly intact, no focal deficits, moving all 4 extremities. Psychiatric: Normal mood and affect.  Laboratory Data: Lab Results  Component Value Date   WBC 7.2 10/23/2006   HGB 9.7 (L) 10/23/2006   HCT 29.7 (L) 10/23/2006   MCV 88.3 10/23/2006   PLT 353 10/23/2006    Lab Results  Component Value Date   CREATININE 1.31 (H) 04/18/2022    Lab Results  Component Value Date   PSA 0.3 09/30/2019   PSA 0.3 06/24/2019   PSA 0.4 03/26/2019    No results found for: "TESTOSTERONE"  No results found for: "HGBA1C"  Urinalysis    Component Value Date/Time   COLORURINE YELLOW 10/14/2006 1821   APPEARANCEUR Clear 04/15/2020 1358   LABSPEC 1.010 10/14/2006 1821   PHURINE 5.5 10/14/2006 1821   GLUCOSEU Negative 04/15/2020 1358   HGBUR TRACE (A) 10/14/2006 1821   BILIRUBINUR Negative 04/15/2020 1358   KETONESUR NEGATIVE 10/14/2006 1821   PROTEINUR Negative 04/15/2020 1358   PROTEINUR NEGATIVE 10/14/2006 1821   UROBILINOGEN 0.2 10/14/2006 1821   NITRITE Negative 04/15/2020 1358   NITRITE NEGATIVE 10/14/2006 1821   LEUKOCYTESUR Trace (A) 04/15/2020 1358    Lab Results  Component Value Date   LABMICR See below: 04/15/2020   WBCUA 0-5 04/15/2020   LABEPIT 0-10 04/15/2020   BACTERIA None seen 04/15/2020    Pertinent Imaging: *** No results found for this or any previous visit.  No results found for this or any previous visit.  No results found for this or any previous visit.  No results found for this or any previous visit.  No results found for this or any previous visit.  No valid procedures specified. No results found for this or any previous visit.  No results found for this or any previous visit.   Assessment & Plan:    1. 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  Wilkie Aye, MD  [facility] [place]

## 2022-09-08 NOTE — Patient Instructions (Signed)

## 2022-09-08 NOTE — Progress Notes (Signed)
Eligard SubQ Injection   Due to Prostate Cancer patient is present today for a Eligard Injection.  Medication: Eligard 3 month Dose: 22.5 mg  Location: right  Lot: [id number] Exp: [id number]  Patient tolerated well, no complications were noted  Performed by: [id number]. CMA

## 2022-09-10 ENCOUNTER — Encounter: Payer: Self-pay | Admitting: Urology

## 2022-11-21 ENCOUNTER — Other Ambulatory Visit: Payer: Medicare Other

## 2022-11-21 DIAGNOSIS — C61 Malignant neoplasm of prostate: Secondary | ICD-10-CM

## 2022-11-21 DIAGNOSIS — C775 Secondary and unspecified malignant neoplasm of intrapelvic lymph nodes: Secondary | ICD-10-CM | POA: Diagnosis not present

## 2022-11-22 DIAGNOSIS — G40909 Epilepsy, unspecified, not intractable, without status epilepticus: Secondary | ICD-10-CM | POA: Diagnosis not present

## 2022-11-22 DIAGNOSIS — J449 Chronic obstructive pulmonary disease, unspecified: Secondary | ICD-10-CM | POA: Diagnosis not present

## 2022-11-22 DIAGNOSIS — E1121 Type 2 diabetes mellitus with diabetic nephropathy: Secondary | ICD-10-CM | POA: Diagnosis not present

## 2022-11-22 DIAGNOSIS — J9612 Chronic respiratory failure with hypercapnia: Secondary | ICD-10-CM | POA: Diagnosis not present

## 2022-11-22 DIAGNOSIS — Z0000 Encounter for general adult medical examination without abnormal findings: Secondary | ICD-10-CM | POA: Diagnosis not present

## 2022-11-22 LAB — PSA: Prostate Specific Ag, Serum: 1.6 ng/mL (ref 0.0–4.0)

## 2022-12-06 ENCOUNTER — Ambulatory Visit: Payer: Medicare Other | Admitting: Urology

## 2022-12-06 VITALS — BP 150/65 | HR 53

## 2022-12-06 DIAGNOSIS — C775 Secondary and unspecified malignant neoplasm of intrapelvic lymph nodes: Secondary | ICD-10-CM | POA: Diagnosis not present

## 2022-12-06 DIAGNOSIS — C61 Malignant neoplasm of prostate: Secondary | ICD-10-CM

## 2022-12-06 MED ORDER — LEUPROLIDE ACETATE (3 MONTH) 22.5 MG ~~LOC~~ KIT
22.5000 mg | PACK | Freq: Once | SUBCUTANEOUS | Status: AC
Start: 2022-12-06 — End: 2022-12-06
  Administered 2022-12-06: 22.5 mg via SUBCUTANEOUS

## 2022-12-06 NOTE — Progress Notes (Signed)
12/06/2022 1:36 PM   Danny Martinez Oct 27, 1933 [id number]  Referring provider: Toma Deiters, MD [address] [place],  [place] [address]  Followup prostate cancer   HPI: Danny Martinez is a 89yo here for followup for metastatic prostate cancer. His PSA increased to 1.6 off of xtandi. He denies any new bone. No worsening LUTS   PMH: Past Medical History:  Diagnosis Date   COPD (chronic obstructive pulmonary disease) (HCC)    Emphysema    History of prostate cancer    Hypoxemia    Pulmonary hypertension (HCC)    PVC (premature ventricular contraction)     Surgical History: Past Surgical History:  Procedure Laterality Date   CYSTECTOMY     PROSTATECTOMY      Home Medications:  Allergies as of 12/06/2022       Reactions   Neomycin-bacitracin Zn-polymyx    REACTION: rash        Medication List        Accurate as of December 06, 2022  1:36 PM. If you have any questions, ask your nurse or doctor.          albuterol (2.5 MG/3ML) 0.083% nebulizer solution Commonly known as: PROVENTIL Take 2.5 mg by nebulization every 4 (four) hours as needed.   apixaban 5 MG Tabs tablet Commonly known as: ELIQUIS Take by mouth.   aspirin 81 MG chewable tablet Chew by mouth.   atenolol 50 MG tablet Commonly known as: TENORMIN Take 50 mg by mouth daily.   atorvastatin 20 MG tablet Commonly known as: LIPITOR Take by mouth.   docusate sodium 100 MG capsule Commonly known as: COLACE Take 100 mg by mouth 2 (two) times daily.   famotidine 20 MG tablet Commonly known as: PEPCID Take 20 mg by mouth 2 (two) times daily.   Fluticasone-Salmeterol 250-50 MCG/DOSE Aepb Commonly known as: ADVAIR Inhale 1 puff into the lungs every 12 (twelve) hours.   glimepiride 1 MG tablet Commonly known as: AMARYL Take 1 mg by mouth daily before breakfast.   HM Senna 8.6 MG tablet Generic drug: senna Take 2 tablets by mouth 2 (two) times daily.   levETIRAcetam 500 MG  tablet Commonly known as: KEPPRA Take 250 mg by mouth 2 (two) times daily.   Linzess 72 MCG capsule Generic drug: linaclotide Take 72 mcg by mouth daily.   metoprolol tartrate 25 MG tablet Commonly known as: LOPRESSOR Take by mouth.   OXYGEN Inhale 3.5 L into the lungs.   Oyster Shell Calcium w/D 500-5 MG-MCG Tabs Take 1 tablet by mouth 2 (two) times daily.   sodium chloride (hypertonic) 3 % solution SMARTSIG:3 Milliliter(s) Via Nebulizer Twice Daily PRN   telmisartan-hydrochlorothiazide 80-25 MG tablet Commonly known as: MICARDIS HCT Take 1 tablet by mouth daily.   tiotropium 18 MCG inhalation capsule Commonly known as: SPIRIVA Place 18 mcg into inhaler and inhale daily.   Vitamin D-3 125 MCG (5000 UT) Tabs Take 1 tablet by mouth daily.        Allergies:  Allergies  Allergen Reactions   Neomycin-Bacitracin Zn-Polymyx     REACTION: rash    Family History: Family History  Problem Relation Age of Onset   Sarcoidosis Brother    Heart disease Father    Rheum arthritis Sister    Rheum arthritis Sister    Rheum arthritis Father    Cancer Mother     Social History:  reports that he quit smoking about 40 years ago. His smoking use included  cigarettes. He started smoking about 60 years ago. He has a 20 pack-year smoking history. He has never used smokeless tobacco. He reports that he does not drink alcohol and does not use drugs.  ROS: All other review of systems were reviewed and are negative except what is noted above in HPI  Physical Exam: BP (!) 150/65   Pulse (!) 53   Constitutional:  Alert and oriented, No acute distress. HEENT: [place] AT, moist mucus membranes.  Trachea midline, no masses. Cardiovascular: No clubbing, cyanosis, or edema. Respiratory: Normal respiratory effort, no increased work of breathing. GI: Abdomen is soft, nontender, nondistended, no abdominal masses GU: No CVA tenderness.  Lymph: No cervical or inguinal lymphadenopathy. Skin: No  rashes, bruises or suspicious lesions. Neurologic: Grossly intact, no focal deficits, moving all 4 extremities. Psychiatric: Normal mood and affect.  Laboratory Data: Lab Results  Component Value Date   WBC 7.2 10/23/2006   HGB 9.7 (L) 10/23/2006   HCT 29.7 (L) 10/23/2006   MCV 88.3 10/23/2006   PLT 353 10/23/2006    Lab Results  Component Value Date   CREATININE 1.31 (H) 04/18/2022    Lab Results  Component Value Date   PSA 0.3 09/30/2019   PSA 0.3 06/24/2019   PSA 0.4 03/26/2019    No results found for: "TESTOSTERONE"  No results found for: "HGBA1C"  Urinalysis    Component Value Date/Time   COLORURINE YELLOW 10/14/2006 1821   APPEARANCEUR Clear 04/15/2020 1358   LABSPEC 1.010 10/14/2006 1821   PHURINE 5.5 10/14/2006 1821   GLUCOSEU Negative 04/15/2020 1358   HGBUR TRACE (A) 10/14/2006 1821   BILIRUBINUR Negative 04/15/2020 1358   KETONESUR NEGATIVE 10/14/2006 1821   PROTEINUR Negative 04/15/2020 1358   PROTEINUR NEGATIVE 10/14/2006 1821   UROBILINOGEN 0.2 10/14/2006 1821   NITRITE Negative 04/15/2020 1358   NITRITE NEGATIVE 10/14/2006 1821   LEUKOCYTESUR Trace (A) 04/15/2020 1358    Lab Results  Component Value Date   LABMICR See below: 04/15/2020   WBCUA 0-5 04/15/2020   LABEPIT 0-10 04/15/2020   BACTERIA None seen 04/15/2020    Pertinent Imaging:  No results found for this or any previous visit.  No results found for this or any previous visit.  No results found for this or any previous visit.  No results found for this or any previous visit.  No results found for this or any previous visit.  No valid procedures specified. No results found for this or any previous visit.  No results found for this or any previous visit.   Assessment & Plan:    1. 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  Wilkie Aye, MD  [facility] [place]

## 2022-12-10 ENCOUNTER — Encounter: Payer: Self-pay | Admitting: Urology

## 2022-12-10 NOTE — Patient Instructions (Signed)

## 2022-12-18 ENCOUNTER — Inpatient Hospital Stay: Payer: Medicare Other

## 2022-12-18 ENCOUNTER — Telehealth: Payer: Self-pay | Admitting: Pharmacist

## 2022-12-18 ENCOUNTER — Inpatient Hospital Stay: Payer: Medicare Other | Attending: Hematology | Admitting: Hematology

## 2022-12-18 ENCOUNTER — Telehealth: Payer: Self-pay

## 2022-12-18 ENCOUNTER — Other Ambulatory Visit (HOSPITAL_COMMUNITY): Payer: Self-pay

## 2022-12-18 VITALS — BP 133/61 | HR 57 | Temp 97.4°F | Resp 19 | Wt 221.0 lb

## 2022-12-18 DIAGNOSIS — Z9079 Acquired absence of other genital organ(s): Secondary | ICD-10-CM | POA: Insufficient documentation

## 2022-12-18 DIAGNOSIS — Z87891 Personal history of nicotine dependence: Secondary | ICD-10-CM | POA: Insufficient documentation

## 2022-12-18 DIAGNOSIS — Z9981 Dependence on supplemental oxygen: Secondary | ICD-10-CM | POA: Diagnosis not present

## 2022-12-18 DIAGNOSIS — N189 Chronic kidney disease, unspecified: Secondary | ICD-10-CM | POA: Diagnosis not present

## 2022-12-18 DIAGNOSIS — C61 Malignant neoplasm of prostate: Secondary | ICD-10-CM

## 2022-12-18 DIAGNOSIS — Z800 Family history of malignant neoplasm of digestive organs: Secondary | ICD-10-CM | POA: Insufficient documentation

## 2022-12-18 DIAGNOSIS — J449 Chronic obstructive pulmonary disease, unspecified: Secondary | ICD-10-CM | POA: Diagnosis not present

## 2022-12-18 DIAGNOSIS — C775 Secondary and unspecified malignant neoplasm of intrapelvic lymph nodes: Secondary | ICD-10-CM | POA: Diagnosis not present

## 2022-12-18 DIAGNOSIS — D631 Anemia in chronic kidney disease: Secondary | ICD-10-CM | POA: Insufficient documentation

## 2022-12-18 LAB — COMPREHENSIVE METABOLIC PANEL
ALT: 13 U/L (ref 0–44)
AST: 16 U/L (ref 15–41)
Albumin: 3.7 g/dL (ref 3.5–5.0)
Alkaline Phosphatase: 42 U/L (ref 38–126)
Anion gap: 7 (ref 5–15)
BUN: 20 mg/dL (ref 8–23)
CO2: 35 mmol/L — ABNORMAL HIGH (ref 22–32)
Calcium: 9.6 mg/dL (ref 8.9–10.3)
Chloride: 93 mmol/L — ABNORMAL LOW (ref 98–111)
Creatinine, Ser: 1.03 mg/dL (ref 0.61–1.24)
GFR, Estimated: >60 mL/min (ref >60)
Glucose, Bld: 82 mg/dL (ref 70–99)
Potassium: 3.8 mmol/L (ref 3.5–5.1)
Sodium: 135 mmol/L (ref 135–145)
Total Bilirubin: 0.3 mg/dL (ref 0.3–1.2)
Total Protein: 6.8 g/dL (ref 6.5–8.1)

## 2022-12-18 LAB — CBC WITH DIFFERENTIAL/PLATELET
Abs Immature Granulocytes: 0 10*3/uL (ref 0.00–0.07)
Basophils Absolute: 0 10*3/uL (ref 0.0–0.1)
Basophils Relative: 0 %
Eosinophils Absolute: 0.4 10*3/uL (ref 0.0–0.5)
Eosinophils Relative: 6 %
HCT: 31.6 % — ABNORMAL LOW (ref 39.0–52.0)
Hemoglobin: 9.3 g/dL — ABNORMAL LOW (ref 13.0–17.0)
Lymphocytes Relative: 34 %
Lymphs Abs: 2.1 10*3/uL (ref 0.7–4.0)
MCH: 28.1 pg (ref 26.0–34.0)
MCHC: 29.4 g/dL — ABNORMAL LOW (ref 30.0–36.0)
MCV: 95.5 fL (ref 80.0–100.0)
Monocytes Absolute: 0.5 10*3/uL (ref 0.1–1.0)
Monocytes Relative: 8 %
Neutro Abs: 3.2 10*3/uL (ref 1.7–7.7)
Neutrophils Relative %: 52 %
Platelets: 148 10*3/uL — ABNORMAL LOW (ref 150–400)
RBC: 3.31 MIL/uL — ABNORMAL LOW (ref 4.22–5.81)
RDW: 13.6 % (ref 11.5–15.5)
WBC: 6.2 10*3/uL (ref 4.0–10.5)
nRBC: 0 % (ref 0.0–0.2)

## 2022-12-18 LAB — PSA: Prostatic Specific Antigen: 2.23 ng/mL (ref 0.00–4.00)

## 2022-12-18 LAB — GENETIC SCREENING ORDER

## 2022-12-18 MED ORDER — PREDNISONE 5 MG PO TABS: 5.0000 mg | ORAL_TABLET | Freq: Every day | ORAL | 3 refills | Status: DC

## 2022-12-18 MED ORDER — ABIRATERONE ACETATE 250 MG PO TABS
1000.0000 mg | ORAL_TABLET | Freq: Every day | ORAL | 0 refills | Status: DC
  Filled 2022-12-22: qty 120, 30d supply, fill #0

## 2022-12-18 NOTE — Patient Instructions (Addendum)
[facility]  Discharge Instructions  You were seen and examined today by Dr. Ellin Saba. Dr. Ellin Saba is a medical oncologist, meaning that he specializes in the treatment of cancer diagnoses. Dr. Ellin Saba discussed your past medical history, family history of cancers, and the events that led to you being here today.  You were referred to Dr. Ellin Saba for ongoing management of your Prostate Cancer.  You will continue the injections with Dr. Dimas Millin office as scheduled.  Dr. Ellin Saba has recommended additional labs today as well as a repeat PET scan to see the current state of your prostate cancer and if there is any additional spread of the cancer.  Dr. Ellin Saba will order Zytiga and Prednisone, which is a different pill for prostate cancer. Do not start it prior to seeing Dr. Ellin Saba next time.   Follow-up as scheduled.  Thank you for choosing [facility] - [facility] to provide your oncology and hematology care.   To afford each patient quality time with our provider, please arrive at least 15 minutes before your scheduled appointment time. You may need to reschedule your appointment if you arrive late (10 or more minutes). Arriving late affects you and other patients whose appointments are after yours.  Also, if you miss three or more appointments without notifying the office, you may be dismissed from the clinic at the provider's discretion.    Again, thank you for choosing [facility].  Our hope is that these requests will decrease the amount of time that you wait before being seen by our physicians.   If you have a lab appointment with the Cancer Center - please note that after April 8th, all labs will be drawn in the cancer center.  You do not have to check in or register with the main entrance as you have in the past but will complete your check-in at the cancer center.             _____________________________________________________________  Should you have questions after your visit to [facility], please contact our office at [phone] and follow the prompts.  Our office hours are 8:00 a.m. to 4:30 p.m. Monday - Thursday and 8:00 a.m. to 2:30 p.m. Friday.  Please note that voicemails left after 4:00 p.m. may not be returned until the following business day.  We are closed weekends and all major holidays.  You do have access to a nurse 24-7, just call the main number to the clinic [phone] and do not press any options, hold on the line and a nurse will answer the phone.    For prescription refill requests, have your pharmacy contact our office and allow 72 hours.    Masks are no longer required in the cancer centers. If you would like for your care team to wear a mask while they are taking care of you, please let them know. You may have one support person who is at least 87 years old accompany you for your appointments.

## 2022-12-18 NOTE — Progress Notes (Signed)
[facility] [address] [place] [address]   Clinic Day:  12/18/2022  Referring physician: Malen Gauze, MD  Patient Care Team: Toma Deiters, MD as PCP - General (Internal Medicine) Doreatha Massed, MD as Medical Oncologist (Medical Oncology) Therese Sarah, RN as Oncology Nurse Navigator (Medical Oncology)   ASSESSMENT & PLAN:   Assessment:  1.  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  Had biochemical recurrence and was started on Lupron. - PSMA PET scan (12/01/2021): Multiple small intensely radiotracer avid prostate cancer metastasis to the pelvic lymph nodes involving right common iliac, right external iliac and right internal iliac nodes.  Most superior metastatic node at the L4 vertebral body level.  No visceral or skeletal metastatic disease. - Lupron every 3 months started on 03/26/2019 - Enzalutamide started on 12/07/2021 with improvement in PSA.  Discontinued after sustaining seizure on 07/17/2022.  PSA has been rising since then.  2.  Social/family history: - Lives at home with his wife.  On oxygen secondary to COPD since 2008, 4 L/min.  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  Also worked in Recruitment consultant.  Quit smoking in 1985, smoked 1 pack/day for 30 years. - Mother died of colon cancer.  Plan:  1.  Metastatic CRPC to pelvic lymph nodes: - We reviewed pathophysiology and prognosis of metastatic CRPC to the lymph nodes which has a better prognosis. - Latest PSA was 1.6 on 11/21/2022, trending up.  Recommend restaging PSMA PET scan. - I have recommended genetic testing. - We discussed next line of treatment with abiraterone 1000 mg daily with prednisone 5 mg daily.  We discussed side effects in detail including hypotension, hypokalemia, fatigue, fluid retention among others. - Recommend follow-up after the PET  scan.   Orders Placed This Encounter  Procedures   NM PET (PSMA) SKULL TO MID THIGH    Standing Status:   Future    Standing Expiration Date:   12/18/2023    Order Specific Question:   If indicated for the ordered procedure, I authorize the administration of a radiopharmaceutical per Radiology protocol    Answer:   Yes    Order Specific Question:   Preferred imaging location?    Answer:   [facility]    Order Specific Question:   Release to patient    Answer:   Immediate   CBC with Differential    Standing Status:   Future    Number of Occurrences:   1    Standing Expiration Date:   12/18/2023   Comprehensive metabolic panel    Standing Status:   Future    Number of Occurrences:   1    Standing Expiration Date:   12/18/2023   Genetic Screening Order    Standing Status:   Future    Number of Occurrences:   1    Standing Expiration Date:   12/18/2023   PSA    Standing Status:   Future    Number of Occurrences:   1    Standing Expiration Date:   12/18/2023      I,Katie Daubenspeck,acting as a scribe for Doreatha Massed, MD.,have documented all relevant documentation on the behalf of Doreatha Massed, MD,as directed by  Doreatha Massed, MD while in the presence of Doreatha Massed, MD.   I, Doreatha Massed MD, have reviewed the above documentation for  accuracy and completeness, and I agree with the above.   Doreatha Massed, MD   10/7/20244:45 PM  CHIEF COMPLAINT/PURPOSE OF CONSULT:   Diagnosis: metastatic prostate cancer to pelvic lymph nodes    Cancer Staging  No matching staging information was found for the patient.    Prior Therapy: 1. prostatectomy, 2003 2. Diana Eves, 11/2021 - 07/2022, discontinued due to seizures  Current Therapy:  ADT   HISTORY OF PRESENT ILLNESS:   Oncology History   No history exists.      Danny Martinez is a 89 y.o. male presenting to clinic today for evaluation of metastatic prostate cancer to pelvic lymph nodes at the  request of Dr. Ronne Binning.  He has a history of prostate cancer, treated with prostatectomy in 2003. He developed biochemical recurrence and was started on Lupron. More recently, his PSA has risen despite ADT, reaching 2.1 in 10/2021. This prompted a staging PSMA PET scan on 12/01/21 showing: no evidence of local recurrence in prostatectomy bed; multiple small intensely radiotracer-avid right-sided pelvic lymph nodes; no visceral or skeletal metastasis. He was subsequently started on Xtandi, and his PSA dropped to 0.4. Unfortunately, he developed seizures in 07/2022, which were felt to be related to the [place]. The medication was discontinued, and he continues on ADT with Eligard. His most recent PSA from 11/21/22 showed a rise to 1.6 off the [place].  Today, he states that he is doing well overall. His appetite level is at 85%. His energy level is at 70%.  PAST MEDICAL HISTORY:   Past Medical History: Past Medical History:  Diagnosis Date   COPD (chronic obstructive pulmonary disease) (HCC)    Emphysema    History of prostate cancer    Hypoxemia    Pulmonary hypertension (HCC)    PVC (premature ventricular contraction)     Surgical History: Past Surgical History:  Procedure Laterality Date   CYSTECTOMY     PROSTATECTOMY      Social History: Social History   Socioeconomic History   Marital status: Married    Spouse name: Not on file   Number of children: Not on file   Years of education: Not on file   Highest education level: Not on file  Occupational History   Occupation: retired    Associate Professor: RETIRED  Tobacco Use   Smoking status: Former    Current packs/day: 0.00    Average packs/day: 1 pack/day for 20.0 years (20.0 ttl pk-yrs)    Types: Cigarettes    Start date: 03/13/1962    Quit date: 03/13/1982    Years since quitting: 40.7   Smokeless tobacco: Never  Substance and Sexual Activity   Alcohol use: No   Drug use: No   Sexual activity: Not on file  Other Topics Concern    Not on file  Social History Narrative   Not on file   Social Determinants of Health   Financial Resource Strain: Low Risk  (08/14/2022)   Received from [facility]   Overall Financial Resource Strain (CARDIA)    Difficulty of Paying Living Expenses: Not hard at all  Food Insecurity: No Food Insecurity (08/14/2022)   Received from [facility], [facility]   Hunger Vital Sign    Worried About Running Out of Food in the Last Year: Never true    Ran Out of Food in the Last Year: Never true  Transportation Needs: No Transportation Needs (08/14/2022)   Received from [facility]   [facility] - Transportation  Lack of Transportation (Medical): No    Lack of Transportation (Non-Medical): No  Physical Activity: Not on file  Stress: No Stress Concern Present (07/18/2022)   Received from [facility], [facility] - Occupational Stress Questionnaire    Feeling of Stress : Not at all  Social Connections: Unknown (07/17/2022)   Received from [facility], [facility]   Social Network    Social Network: Not on file  Intimate Partner Violence: Not At Risk (07/18/2022)   Received from [facility]   HITS    Over the last 12 months how often did your partner physically hurt you?: 1    Over the last 12 months how often did your partner insult you or talk down to you?: 1    Over the last 12 months how often did your partner threaten you with physical harm?: 1    Over the last 12 months how often did your partner scream or curse at you?: 1    Family History: Family History  Problem Relation Age of Onset   Sarcoidosis Brother    Heart disease Father    Rheum arthritis Sister    Rheum arthritis Sister    Rheum arthritis Father    Cancer Mother     Current Medications:  Current Outpatient Medications:    abiraterone acetate (ZYTIGA) 250 MG tablet, Take 4 tablets (1,000 mg total) by mouth daily. Take  on an empty stomach 1 hour before or 2 hours after a meal, Disp: 120 tablet, Rfl: 0   albuterol (PROVENTIL) (2.5 MG/3ML) 0.083% nebulizer solution, Take 2.5 mg by nebulization every 4 (four) hours as needed., Disp: , Rfl:    apixaban (ELIQUIS) 5 MG TABS tablet, Take by mouth., Disp: , Rfl:    aspirin 81 MG chewable tablet, Chew by mouth., Disp: , Rfl:    atenolol (TENORMIN) 50 MG tablet, Take 50 mg by mouth daily., Disp: , Rfl:    atorvastatin (LIPITOR) 20 MG tablet, Take by mouth., Disp: , Rfl:    Calcium Carb-Cholecalciferol (OYSTER SHELL CALCIUM W/D) 500-5 MG-MCG TABS, Take 1 tablet by mouth 2 (two) times daily., Disp: , Rfl:    Cholecalciferol (VITAMIN D-3) 5000 UNITS TABS, Take 1 tablet by mouth daily., Disp: , Rfl:    docusate sodium (COLACE) 100 MG capsule, Take 100 mg by mouth 2 (two) times daily., Disp: , Rfl:    famotidine (PEPCID) 20 MG tablet, Take 20 mg by mouth 2 (two) times daily., Disp: , Rfl:    Fluticasone-Salmeterol (ADVAIR) 250-50 MCG/DOSE AEPB, Inhale 1 puff into the lungs every 12 (twelve) hours., Disp: , Rfl:    glimepiride (AMARYL) 1 MG tablet, Take 1 mg by mouth daily before breakfast., Disp: , Rfl:    HM SENNA 8.6 MG tablet, Take 2 tablets by mouth 2 (two) times daily., Disp: , Rfl:    levETIRAcetam (KEPPRA) 500 MG tablet, Take 250 mg by mouth 2 (two) times daily., Disp: , Rfl:    LINZESS 72 MCG capsule, Take 72 mcg by mouth daily., Disp: , Rfl:    metoprolol tartrate (LOPRESSOR) 25 MG tablet, Take by mouth., Disp: , Rfl:    OXYGEN, Inhale 3.5 L into the lungs., Disp: , Rfl:    predniSONE (DELTASONE) 5 MG tablet, Take 1 tablet (5 mg total) by mouth daily with breakfast., Disp: 30 tablet, Rfl: 3   sodium chloride, hypertonic, 3 % solution, SMARTSIG:3 Milliliter(s) Via Nebulizer Twice Daily PRN, Disp: ,  Rfl:    telmisartan-hydrochlorothiazide (MICARDIS HCT) 80-25 MG tablet, Take 1 tablet by mouth daily., Disp: , Rfl:    tiotropium (SPIRIVA) 18 MCG inhalation capsule, Place  18 mcg into inhaler and inhale daily., Disp: , Rfl:   Current Facility-Administered Medications:    Leuprolide Acetate (3 Month) (ELIGARD) 22.5 MG injection 22.5 mg, 22.5 mg, Subcutaneous, Q90 days, McKenzie, Mardene Celeste, MD, 22.5 mg at 09/08/22 1209   Allergies: Allergies  Allergen Reactions   Neomycin-Bacitracin Zn-Polymyx     REACTION: rash    REVIEW OF SYSTEMS:   Review of Systems  Constitutional:  Negative for chills, fatigue and fever.  HENT:   Negative for lump/mass, mouth sores, nosebleeds, sore throat and trouble swallowing.   Eyes:  Negative for eye problems.  Respiratory:  Positive for shortness of breath. Negative for cough.   Cardiovascular:  Negative for chest pain, leg swelling and palpitations.  Gastrointestinal:  Positive for constipation. Negative for abdominal pain, diarrhea, nausea and vomiting.  Genitourinary:  Negative for bladder incontinence, difficulty urinating, dysuria, frequency, hematuria and nocturia.   Musculoskeletal:  Negative for arthralgias, back pain, flank pain, myalgias and neck pain.  Skin:  Negative for itching and rash.  Neurological:  Negative for dizziness, headaches and numbness.  Hematological:  Does not bruise/bleed easily.  Psychiatric/Behavioral:  Negative for depression, sleep disturbance and suicidal ideas. The patient is not nervous/anxious.   All other systems reviewed and are negative.    VITALS:   Blood pressure 133/61, pulse (!) 57, temperature (!) 97.4 F (36.3 C), temperature source Oral, resp. rate 19, weight 221 lb (100.2 kg), SpO2 100%.  Wt Readings from Last 3 Encounters:  12/18/22 221 lb (100.2 kg)  10/24/21 219 lb (99.3 kg)  04/22/21 226 lb (102.5 kg)    Body mass index is 28.76 kg/m.  Performance status (ECOG): 1 - Symptomatic but completely ambulatory  PHYSICAL EXAM:   Physical Exam Vitals and nursing note reviewed. Exam conducted with a chaperone present.  Constitutional:      Appearance: Normal  appearance.  Cardiovascular:     Rate and Rhythm: Normal rate and regular rhythm.     Pulses: Normal pulses.     Heart sounds: Normal heart sounds.  Pulmonary:     Effort: Pulmonary effort is normal.     Breath sounds: Normal breath sounds.  Abdominal:     Palpations: Abdomen is soft. There is no hepatomegaly, splenomegaly or mass.     Tenderness: There is no abdominal tenderness.  Musculoskeletal:     Right lower leg: Edema present.     Left lower leg: Edema present.  Lymphadenopathy:     Cervical: No cervical adenopathy.     Right cervical: No superficial, deep or posterior cervical adenopathy.    Left cervical: No superficial, deep or posterior cervical adenopathy.     Upper Body:     Right upper body: No supraclavicular or axillary adenopathy.     Left upper body: No supraclavicular or axillary adenopathy.  Neurological:     General: No focal deficit present.     Mental Status: He is alert and oriented to person, place, and time.  Psychiatric:        Mood and Affect: Mood normal.        Behavior: Behavior normal.     LABS:      Latest Ref Rng & Units 12/18/2022    1:51 PM 10/23/2006    3:50 AM 10/22/2006    3:50 AM  CBC  WBC 4.0 - 10.5 K/uL 6.2  7.2  8.0   Hemoglobin 13.0 - 17.0 g/dL 9.3  9.7  16.1   Hematocrit 39.0 - 52.0 % 31.6  29.7  31.2   Platelets 150 - 400 K/uL 148  353  349       Latest Ref Rng & Units 12/18/2022    1:51 PM 04/18/2022    9:31 AM 10/29/2020   12:34 PM  CMP  Glucose 70 - 99 mg/dL 82  096    BUN 8 - 23 mg/dL 20  20    Creatinine 0.45 - 1.24 mg/dL 4.09  8.11  9.14   Sodium 135 - 145 mmol/L 135  143    Potassium 3.5 - 5.1 mmol/L 3.8  4.8    Chloride 98 - 111 mmol/L 93  99    CO2 22 - 32 mmol/L 35  29    Calcium 8.9 - 10.3 mg/dL 9.6  78.2    Total Protein 6.5 - 8.1 g/dL 6.8  6.5    Total Bilirubin 0.3 - 1.2 mg/dL 0.3  <9.5    Alkaline Phos 38 - 126 U/L 42  49    AST 15 - 41 U/L 16  13    ALT 0 - 44 U/L 13  7       No results found for:  "CEA1", "CEA" / No results found for: "CEA1", "CEA" Lab Results  Component Value Date   PSA1 1.6 11/21/2022   No results found for: "AOZ308" No results found for: "CAN125"  No results found for: "TOTALPROTELP", "ALBUMINELP", "A1GS", "A2GS", "BETS", "BETA2SER", "GAMS", "MSPIKE", "SPEI" No results found for: "TIBC", "FERRITIN", "IRONPCTSAT" No results found for: "LDH"   STUDIES:   No results found.

## 2022-12-18 NOTE — Telephone Encounter (Signed)
Oral Oncology Patient Advocate Encounter  New authorization   Received notification that prior authorization for Abiraterone is required.   PA submitted on 12/18/22  Key [id number]  Status is pending     Ardeen Fillers, CPhT Oncology Pharmacy Patient Advocate  [facility]  [phone] (phone) [phone] (fax) 12/18/2022 4:17 PM

## 2022-12-18 NOTE — Telephone Encounter (Signed)
Clinical Pharmacist Practitioner Encounter   Received new prescription for Zytiga (abiraterone) for the treatment of metastatic prostate cancer in conjunction with prednisone and ADT (given at urology office), planned duration until disease progression or unacceptable drug toxicity.  CMP from 12/18/22 assessed, no relevant lab abnormalities. Prescription dose and frequency assessed.   Current medication list in Epic reviewed, a few DDIs with abiraterone identified: Atorvastatin: Abiraterone Acetate may enhance the myopathic (rhabdomyolysis) effect of HMG-CoA Reductase Inhibitors (Statins). Monitor for evidence of muscle toxicities (eg, myopathy, rhabdomyolysis). No recommendation for baseline dose adjustment. Metoprolol: abiraterone may increase the serum concentration of Metoprolol. Monitor closely for evidence of excessive response to metoprolol. No recommendation for baseline dose adjustment.  Evaluated chart and no patient barriers to medication adherence identified.   Prescription has been e-scribed to the [facility] for benefits analysis and approval.  Oral Oncology Clinic will continue to follow for insurance authorization, copayment issues, initial counseling and start date.   Remi Haggard, PharmD, BCPS, BCOP, CPP Hematology/Oncology Clinical Pharmacist Practitioner [facility] [phone]  12/18/2022 2:47 PM

## 2022-12-19 ENCOUNTER — Other Ambulatory Visit (HOSPITAL_COMMUNITY): Payer: Self-pay

## 2022-12-19 DIAGNOSIS — H35033 Hypertensive retinopathy, bilateral: Secondary | ICD-10-CM | POA: Diagnosis not present

## 2022-12-19 NOTE — Telephone Encounter (Addendum)
Oral Oncology Patient Advocate Encounter  Prior Authorization for Abiraterone has been approved.    PA# [id number]  Effective dates: 12/19/22 through 12/19/23  Patients co-pay is $202.02.  [facility] would be $140.00  [facility] would be ~$105.00 including shipping.     Ardeen Fillers, CPhT Oncology Pharmacy Patient Advocate  [facility]  [phone] (phone) [phone] (fax) 12/19/2022 9:10 AM

## 2022-12-19 NOTE — Telephone Encounter (Signed)
Called patient to go over pricing of Abiraterone. Patient would like to discuss options with family and reach back out to me once decided. Patient knows to call me at [phone] to let us know. I will continue to follow and update until final decision is made.    Ardeen Fillers, CPhT Oncology Pharmacy Patient Advocate  [facility]  [phone] (phone) [phone] (fax) 12/19/2022 2:09 PM

## 2022-12-22 ENCOUNTER — Telehealth: Payer: Self-pay

## 2022-12-22 ENCOUNTER — Other Ambulatory Visit: Payer: Self-pay

## 2022-12-22 ENCOUNTER — Other Ambulatory Visit (HOSPITAL_COMMUNITY): Payer: Self-pay

## 2022-12-22 NOTE — Progress Notes (Signed)
Patient counseled in telephone encounter on 12/22/22.

## 2022-12-22 NOTE — Progress Notes (Signed)
Specialty Pharmacy Initial Fill Coordination Note  DALE RIBEIRO is a 89 y.o. male contacted today regarding refills of specialty medication(s) Abiraterone Acetate   Patient requested Delivery   Delivery date: 12/26/22   Verified address: [address], [place], [place] [address]   Medication will be filled on 12/25/22.   Patient is aware of $140.00 copayment.  CC info sent to Art gallery manager.

## 2022-12-22 NOTE — Telephone Encounter (Signed)
Oral Chemotherapy Pharmacist Encounter  I spoke with patient for overview of: Zytiga for the treatment of metastatic, castration-resistant prostate cancer in conjunction with prednisone and ADT, planned duration until disease progression or unacceptable toxicity.   Counseled patient on administration, dosing, side effects, monitoring, drug-food interactions, safe handling, storage, and disposal.  Patient will take Zytiga 250mg  tablets, 4 tablets (1000mg ) by mouth once daily on an empty stomach, 1 hour before or 2 hours after a meal.  Patient will take prednisone 5mg  tablet, 1 tablet by mouth one daily with breakfast.  Zytiga and prednisone start date: 12/27/2022  Adverse effects include but are not limited to: peripheral edema, GI upset, hypertension, hot flashes, fatigue, and arthralgias.    Prednisone prescription has been sent to [facility] on 12/18/2022. Patient will obtain prednisone and knows to start prednisone on the same day as Zytiga start.  Reviewed with patient importance of keeping a medication schedule and plan for any missed doses. No barriers to medication adherence identified.  Medication reconciliation performed and medication/allergy list updated. Prescription will be sent from [facility] on 12/25/22 to arrive to patient on 12/26/22.  Patient informed the pharmacy will reach out 5-7 days prior to needing next fill of Zytiga to coordinate continued medication acquisition to prevent break in therapy.  All questions answered.  Patient voiced understanding and appreciation.   Medication education handout placed in mail for patient. Patient knows to call the office with questions or concerns. Oral Chemotherapy Clinic phone number provided to patient.   Bethel Born, PharmD Hematology/Oncology Clinical Pharmacist [facility] Oral Chemotherapy Navigation Clinic [phone] 12/22/2022 11:13 AM

## 2022-12-22 NOTE — Telephone Encounter (Signed)
**  Patient decided to go with [facility] and Nedra Hai of $140.00**  Patient successfully OnBoarded and drug education provided by pharmacist. Medication scheduled to be shipped on Monday, 12/25/22, for delivery on Tuesday, 12/26/22, from [facility] to patient's address. Patient also knows to call me at [phone] with any questions or concerns regarding receiving medication or if there is any unexpected change in co-pay.    Ardeen Fillers, CPhT Oncology Pharmacy Patient Advocate  [facility]  [phone] (phone) [phone] (fax) 12/22/2022 11:43 AM

## 2022-12-26 ENCOUNTER — Telehealth: Payer: Self-pay

## 2022-12-26 ENCOUNTER — Other Ambulatory Visit (HOSPITAL_COMMUNITY): Payer: Self-pay

## 2022-12-26 ENCOUNTER — Encounter: Payer: Self-pay | Admitting: Licensed Clinical Social Worker

## 2022-12-26 DIAGNOSIS — Z1379 Encounter for other screening for genetic and chromosomal anomalies: Secondary | ICD-10-CM | POA: Insufficient documentation

## 2022-12-26 NOTE — Telephone Encounter (Signed)
Oral Oncology Patient Advocate Encounter   Was successful in securing patient a $5,500.00 grant from [employer] ([employer]) to provide copayment coverage for Abiraterone. This will keep the out of pocket expense at $0.     I have spoken with the patient.    The billing information is as follows and has been shared with [facility].   RxBin: [id number] PCN:  [id number] Member ID: [id number] Group ID: [id number] Dates of Eligibility: 06/29/22 through 12/26/23   Ardeen Fillers, CPhT Oncology Pharmacy Patient Advocate  [facility]  [phone] (phone) [phone] (fax) 12/26/2022 3:23 PM

## 2022-12-28 ENCOUNTER — Encounter (HOSPITAL_COMMUNITY)
Admission: RE | Admit: 2022-12-28 | Discharge: 2022-12-28 | Disposition: A | Discharge status: Home / Self Care | Payer: Medicare Other | Source: Ambulatory Visit | Attending: Hematology | Admitting: Hematology

## 2022-12-28 ENCOUNTER — Other Ambulatory Visit (HOSPITAL_COMMUNITY): Payer: Medicare Other

## 2022-12-28 DIAGNOSIS — C61 Malignant neoplasm of prostate: Secondary | ICD-10-CM | POA: Diagnosis not present

## 2022-12-28 MED ORDER — FLOTUFOLASTAT F 18 GALLIUM 296-5846 MBQ/ML IV SOLN
8.5200 | Freq: Once | INTRAVENOUS | Status: AC
  Administered 2022-12-28: 8.52 via INTRAVENOUS
  Filled 2022-12-28: qty 9

## 2023-01-03 ENCOUNTER — Inpatient Hospital Stay (HOSPITAL_BASED_OUTPATIENT_CLINIC_OR_DEPARTMENT_OTHER): Payer: Medicare Other | Admitting: Hematology

## 2023-01-03 ENCOUNTER — Other Ambulatory Visit: Payer: Self-pay

## 2023-01-03 ENCOUNTER — Encounter: Payer: Self-pay | Admitting: Hematology

## 2023-01-03 VITALS — BP 149/71 | HR 63 | Temp 98.2°F | Resp 24 | Wt 226.0 lb

## 2023-01-03 DIAGNOSIS — J449 Chronic obstructive pulmonary disease, unspecified: Secondary | ICD-10-CM | POA: Diagnosis not present

## 2023-01-03 DIAGNOSIS — Z9079 Acquired absence of other genital organ(s): Secondary | ICD-10-CM | POA: Diagnosis not present

## 2023-01-03 DIAGNOSIS — N189 Chronic kidney disease, unspecified: Secondary | ICD-10-CM | POA: Diagnosis not present

## 2023-01-03 DIAGNOSIS — C775 Secondary and unspecified malignant neoplasm of intrapelvic lymph nodes: Secondary | ICD-10-CM | POA: Diagnosis not present

## 2023-01-03 DIAGNOSIS — Z800 Family history of malignant neoplasm of digestive organs: Secondary | ICD-10-CM | POA: Diagnosis not present

## 2023-01-03 DIAGNOSIS — D508 Other iron deficiency anemias: Secondary | ICD-10-CM

## 2023-01-03 DIAGNOSIS — Z9981 Dependence on supplemental oxygen: Secondary | ICD-10-CM | POA: Diagnosis not present

## 2023-01-03 DIAGNOSIS — Z87891 Personal history of nicotine dependence: Secondary | ICD-10-CM | POA: Diagnosis not present

## 2023-01-03 DIAGNOSIS — C61 Malignant neoplasm of prostate: Secondary | ICD-10-CM | POA: Diagnosis not present

## 2023-01-03 DIAGNOSIS — D631 Anemia in chronic kidney disease: Secondary | ICD-10-CM | POA: Diagnosis not present

## 2023-01-03 NOTE — Progress Notes (Signed)
[facility] [address] [place] [address]    Clinic Day:  01/03/2023  Referring physician: Toma Deiters, MD  Patient Care Team: Toma Deiters, MD as PCP - General (Internal Medicine) Doreatha Massed, MD as Medical Oncologist (Medical Oncology) Therese Sarah, RN as Oncology Nurse Navigator (Medical Oncology)   ASSESSMENT & PLAN:   Assessment: 1.  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  Had biochemical recurrence and was started on Lupron. - PSMA PET scan (12/01/2021): Multiple small intensely radiotracer avid prostate cancer metastasis to the pelvic lymph nodes involving right common iliac, right external iliac and right internal iliac nodes.  Most superior metastatic node at the L4 vertebral body level.  No visceral or skeletal metastatic disease. - Lupron every 3 months started on 03/26/2019 - Enzalutamide started on 12/07/2021 with improvement in PSA.  Discontinued after sustaining seizure on 07/17/2022.  PSA has been rising since then. - Germline mutation testing: Negative - PSMA PET (12/28/2022): Near identical pattern with the right internal iliac lymph node 10 mm SUV 30.7, right external iliac lymph node.  No new pelvic lymph nodes.  No visceral or bone metastatic disease.   2.  Social/family history: - Lives at home with his wife.  On oxygen secondary to COPD since 2008, 4 L/min.  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  Also worked in Recruitment consultant.  Quit smoking in 1985, smoked 1 pack/day for 30 years. - Mother died of colon cancer.    Plan: 1.  Metastatic CRPC to pelvic lymph nodes: - We reviewed genetic testing results which was negative. - PET scan showed metastatic lymph nodes in the right iliac region.  No bone lesions are visible lesions. - PSA has increased to 0.23. - We talked about starting him on Zytiga  1000 mg daily with prednisone 5 mg daily.  We discussed side effects in detail.  He will start the pills tomorrow. - I will see him back in 2 weeks to see how he is tolerating.  2.  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 - Will do further workup with ferritin, iron panel, B12, folic acid, MMA and copper.    Orders Placed This Encounter  Procedures   Comprehensive metabolic panel    Standing Status:   Future    Standing Expiration Date:   01/03/2024   Iron and TIBC ([facility] ONLY)    Standing Status:   Future    Standing Expiration Date:   01/03/2024   Ferritin    Standing Status:   Future    Standing Expiration Date:   01/03/2024   Vitamin B12    Standing Status:   Future    Standing Expiration Date:   01/03/2024   Folate    Standing Status:   Future    Standing Expiration Date:   01/03/2024   Methylmalonic acid, serum    Standing Status:   Future    Standing Expiration Date:   01/03/2024   Copper, serum    Standing Status:   Future    Standing Expiration Date:   01/03/2024      I,Katie Daubenspeck,acting as a scribe for Doreatha Massed, MD.,have documented all relevant documentation on the behalf of Doreatha Massed, MD,as directed by  Doreatha Massed, MD while in the presence of Doreatha Massed, MD.  I, Doreatha Massed MD, have reviewed the above documentation for accuracy and completeness, and I agree with the above.   Doreatha Massed, MD   10/23/20241:04 PM  CHIEF COMPLAINT:   Diagnosis: metastatic prostate cancer to pelvic lymph nodes    Cancer Staging  No matching staging information was found for the patient.    Prior Therapy: 1. prostatectomy, 2003 2. Diana Eves, 11/2021 - 07/2022, discontinued due to seizures  Current Therapy:  ADT    HISTORY OF PRESENT ILLNESS:   Oncology History   No history exists.     INTERVAL HISTORY:   Danny Martinez is a 89 y.o. male  presenting to clinic today for follow up of metastatic prostate cancer to pelvic lymph nodes. He was last seen by me on 12/18/22 in consultation.  Since his last visit, he underwent restaging PSMA PET scan on 12/28/22.   Today, he states that he is doing well overall. His appetite level is at 100%. His energy level is at 50%.  PAST MEDICAL HISTORY:   Past Medical History: Past Medical History:  Diagnosis Date   COPD (chronic obstructive pulmonary disease) (HCC)    Emphysema    History of prostate cancer    Hypoxemia    Pulmonary hypertension (HCC)    PVC (premature ventricular contraction)     Surgical History: Past Surgical History:  Procedure Laterality Date   CYSTECTOMY     PROSTATECTOMY      Social History: Social History   Socioeconomic History   Marital status: Married    Spouse name: Not on file   Number of children: Not on file   Years of education: Not on file   Highest education level: Not on file  Occupational History   Occupation: retired    Associate Professor: RETIRED  Tobacco Use   Smoking status: Former    Current packs/day: 0.00    Average packs/day: 1 pack/day for 20.0 years (20.0 ttl pk-yrs)    Types: Cigarettes    Start date: 03/13/1962    Quit date: 03/13/1982    Years since quitting: 40.8   Smokeless tobacco: Never  Substance and Sexual Activity   Alcohol use: No   Drug use: No   Sexual activity: Not on file  Other Topics Concern   Not on file  Social History Narrative   Not on file   Social Determinants of Health   Financial Resource Strain: Low Risk  (08/14/2022)   Received from [facility], [facility]   Overall Financial Resource Strain (CARDIA)    Difficulty of Paying Living Expenses: Not hard at all  Food Insecurity: No Food Insecurity (08/14/2022)   Received from [facility], [facility]   Hunger Vital Sign    Worried About Running Out of Food in the Last Year: Never true    Ran Out of Food in the Last Year: Never true   Transportation Needs: No Transportation Needs (08/14/2022)   Received from [facility], [facility]   PRAPARE - Transportation    Lack of Transportation (Medical): No    Lack of Transportation (Non-Medical): No  Physical Activity: Not on file  Stress: No Stress Concern Present (07/18/2022)   Received from [facility], [facility] - Occupational Stress Questionnaire    Feeling of Stress : Not at all  Social Connections: Unknown (07/17/2022)   Received from [facility], [facility]   Social Network    Social Network: Not on file  Intimate Partner Violence: Not At  Risk (07/18/2022)   Received from [facility], [facility]   HITS    Over the last 12 months how often did your partner physically hurt you?: 1    Over the last 12 months how often did your partner insult you or talk down to you?: 1    Over the last 12 months how often did your partner threaten you with physical harm?: 1    Over the last 12 months how often did your partner scream or curse at you?: 1    Family History: Family History  Problem Relation Age of Onset   Sarcoidosis Brother    Heart disease Father    Rheum arthritis Sister    Rheum arthritis Sister    Rheum arthritis Father    Cancer Mother     Current Medications:  Current Outpatient Medications:    albuterol (PROVENTIL) (2.5 MG/3ML) 0.083% nebulizer solution, Take 2.5 mg by nebulization every 4 (four) hours as needed., Disp: , Rfl:    apixaban (ELIQUIS) 5 MG TABS tablet, Take by mouth., Disp: , Rfl:    aspirin 81 MG chewable tablet, Chew by mouth., Disp: , Rfl:    atenolol (TENORMIN) 50 MG tablet, Take 50 mg by mouth daily., Disp: , Rfl:    atorvastatin (LIPITOR) 20 MG tablet, Take by mouth., Disp: , Rfl:    Calcium Carb-Cholecalciferol (OYSTER SHELL CALCIUM W/D) 500-5 MG-MCG TABS, Take 1 tablet by mouth 2 (two) times daily., Disp: , Rfl:    Cholecalciferol (VITAMIN D-3) 5000 UNITS TABS, Take 1 tablet  by mouth daily., Disp: , Rfl:    docusate sodium (COLACE) 100 MG capsule, Take 100 mg by mouth 2 (two) times daily., Disp: , Rfl:    famotidine (PEPCID) 20 MG tablet, Take 20 mg by mouth 2 (two) times daily., Disp: , Rfl:    Fluticasone-Salmeterol (ADVAIR) 250-50 MCG/DOSE AEPB, Inhale 1 puff into the lungs every 12 (twelve) hours., Disp: , Rfl:    glimepiride (AMARYL) 1 MG tablet, Take 1 mg by mouth daily before breakfast., Disp: , Rfl:    HM SENNA 8.6 MG tablet, Take 2 tablets by mouth 2 (two) times daily., Disp: , Rfl:    levETIRAcetam (KEPPRA) 500 MG tablet, Take 250 mg by mouth 2 (two) times daily., Disp: , Rfl:    LINZESS 72 MCG capsule, Take 72 mcg by mouth daily., Disp: , Rfl:    metoprolol tartrate (LOPRESSOR) 25 MG tablet, Take by mouth., Disp: , Rfl:    OXYGEN, Inhale 3.5 L into the lungs., Disp: , Rfl:    predniSONE (DELTASONE) 5 MG tablet, Take 1 tablet (5 mg total) by mouth daily with breakfast., Disp: 30 tablet, Rfl: 3   sodium chloride, hypertonic, 3 % solution, SMARTSIG:3 Milliliter(s) Via Nebulizer Twice Daily PRN, Disp: , Rfl:    telmisartan-hydrochlorothiazide (MICARDIS HCT) 80-25 MG tablet, Take 1 tablet by mouth daily., Disp: , Rfl:    tiotropium (SPIRIVA) 18 MCG inhalation capsule, Place 18 mcg into inhaler and inhale daily., Disp: , Rfl:    abiraterone acetate (ZYTIGA) 250 MG tablet, Take 4 tablets (1,000 mg total) by mouth daily. Take on an empty stomach 1 hour before or 2 hours after a meal (Patient not taking: Reported on 01/03/2023), Disp: 120 tablet, Rfl: 0  Current Facility-Administered Medications:    Leuprolide Acetate (3 Month) (ELIGARD) 22.5 MG injection 22.5 mg, 22.5 mg, Subcutaneous, Q90 days, McKenzie, Mardene Celeste, MD, 22.5 mg at 09/08/22 1209   Allergies: Allergies  Allergen Reactions  Neomycin-Bacitracin Zn-Polymyx     REACTION: rash    REVIEW OF SYSTEMS:   Review of Systems  Constitutional:  Positive for fatigue. Negative for chills and fever.   HENT:   Negative for lump/mass, mouth sores, nosebleeds, sore throat and trouble swallowing.   Eyes:  Negative for eye problems.  Respiratory:  Negative for cough and shortness of breath.   Cardiovascular:  Negative for chest pain, leg swelling and palpitations.  Gastrointestinal:  Negative for abdominal pain, constipation, diarrhea, nausea and vomiting.  Genitourinary:  Negative for bladder incontinence, difficulty urinating, dysuria, frequency, hematuria and nocturia.   Musculoskeletal:  Negative for arthralgias, back pain, flank pain, myalgias and neck pain.  Skin:  Negative for itching and rash.  Neurological:  Negative for dizziness, headaches and numbness.  Hematological:  Does not bruise/bleed easily.  Psychiatric/Behavioral:  Negative for depression, sleep disturbance and suicidal ideas. The patient is not nervous/anxious.   All other systems reviewed and are negative.    VITALS:   Blood pressure (!) 149/71, pulse 63, temperature 98.2 F (36.8 C), temperature source Oral, resp. rate (!) 24, weight 226 lb (102.5 kg), SpO2 95%.  Wt Readings from Last 3 Encounters:  01/03/23 226 lb (102.5 kg)  12/18/22 221 lb (100.2 kg)  10/24/21 219 lb (99.3 kg)    Body mass index is 29.41 kg/m.  Performance status (ECOG): 1 - Symptomatic but completely ambulatory  PHYSICAL EXAM:   Physical Exam Vitals and nursing note reviewed. Exam conducted with a chaperone present.  Constitutional:      Appearance: Normal appearance.  Cardiovascular:     Rate and Rhythm: Normal rate and regular rhythm.     Pulses: Normal pulses.     Heart sounds: Normal heart sounds.  Pulmonary:     Effort: Pulmonary effort is normal.     Breath sounds: Normal breath sounds.  Abdominal:     Palpations: Abdomen is soft. There is no hepatomegaly, splenomegaly or mass.     Tenderness: There is no abdominal tenderness.  Musculoskeletal:     Right lower leg: No edema.     Left lower leg: No edema.   Lymphadenopathy:     Cervical: No cervical adenopathy.     Right cervical: No superficial, deep or posterior cervical adenopathy.    Left cervical: No superficial, deep or posterior cervical adenopathy.     Upper Body:     Right upper body: No supraclavicular or axillary adenopathy.     Left upper body: No supraclavicular or axillary adenopathy.  Neurological:     General: No focal deficit present.     Mental Status: He is alert and oriented to person, place, and time.  Psychiatric:        Mood and Affect: Mood normal.        Behavior: Behavior normal.     LABS:      Latest Ref Rng & Units 12/18/2022    1:51 PM 10/23/2006    3:50 AM 10/22/2006    3:50 AM  CBC  WBC 4.0 - 10.5 K/uL 6.2  7.2  8.0   Hemoglobin 13.0 - 17.0 g/dL 9.3  9.7  16.1   Hematocrit 39.0 - 52.0 % 31.6  29.7  31.2   Platelets 150 - 400 K/uL 148  353  349       Latest Ref Rng & Units 12/18/2022    1:51 PM 04/18/2022    9:31 AM 10/29/2020   12:34 PM  CMP  Glucose 70 - 99  mg/dL 82  366    BUN 8 - 23 mg/dL 20  20    Creatinine 4.40 - 1.24 mg/dL 3.47  4.25  9.56   Sodium 135 - 145 mmol/L 135  143    Potassium 3.5 - 5.1 mmol/L 3.8  4.8    Chloride 98 - 111 mmol/L 93  99    CO2 22 - 32 mmol/L 35  29    Calcium 8.9 - 10.3 mg/dL 9.6  38.7    Total Protein 6.5 - 8.1 g/dL 6.8  6.5    Total Bilirubin 0.3 - 1.2 mg/dL 0.3  <5.6    Alkaline Phos 38 - 126 U/L 42  49    AST 15 - 41 U/L 16  13    ALT 0 - 44 U/L 13  7       No results found for: "CEA1", "CEA" / No results found for: "CEA1", "CEA" Lab Results  Component Value Date   PSA1 1.6 11/21/2022   No results found for: "EPP295" No results found for: "CAN125"  No results found for: "TOTALPROTELP", "ALBUMINELP", "A1GS", "A2GS", "BETS", "BETA2SER", "GAMS", "MSPIKE", "SPEI" No results found for: "TIBC", "FERRITIN", "IRONPCTSAT" No results found for: "LDH"   STUDIES:   NM PET (PSMA) SKULL TO MID THIGH  Result Date: 01/03/2023 CLINICAL DATA:  Prostate  cancer nodal metastasis. EXAM: NUCLEAR MEDICINE PET SKULL BASE TO THIGH TECHNIQUE: 8.5 mCi Flotufolastat (Posluma) was injected intravenously. Full-ring PET imaging was performed from the skull base to thigh after the radiotracer. CT data was obtained and used for attenuation correction and anatomic localization. COMPARISON:  PSMA PET scan 12/01/2021 FINDINGS: NECK No radiotracer activity in neck lymph nodes. Incidental CT finding: None. CHEST No radiotracer accumulation within mediastinal or hilar lymph nodes. No suspicious pulmonary nodules on the CT scan. Incidental CT finding: Marked symmetric bilateral gynecomastia. No change from prior. ABDOMEN/PELVIS Prostate: No focal activity in the prostate bed. Lymph nodes: Again demonstrated intense radiotracer avid iRIGHT common iliac, internal iliac and external iliac lymph nodes. Near identical pattern to comparison exam. for example RIGHT internal iliac lymph node along the operator fossa measures 10 mm (127) compared to 10 mm on prior with SUV max equal 30.7 compared SUV max equal 43.5. RIGHT external iliac node on image 148 measures 5 mm with SUV max equal 33.5 compared SUV max equal 43.5 on prior. No new pelvic lymph nodes. No radiotracer avid lymph nodes present outside of the pelvis. Liver: No evidence of liver metastasis. Incidental CT finding: None. SKELETON No focal activity to suggest skeletal metastasis. IMPRESSION: 1. Near identical pattern of intense radiotracer avid metastatic RIGHT iliac lymph nodes. No evidence of progression or improvement from comparison exam. 2. No evidence of local recurrence in the  prostatectomy bed. 3. No evidence of visceral metastasis or skeletal metastasis. 4. Marked symmetric bilateral gynecomastia.  No interval change. Electronically Signed   By: Genevive Bi M.D.   On: 01/03/2023 12:50

## 2023-01-03 NOTE — Patient Instructions (Addendum)
[facility] at [facility] Discharge Instructions   You were seen and examined today by Dr. Ellin Saba.  He reviewed the results of your lab work from your last visit which are normal/stable. Your hemoglobin is low at 9 and your PSA went up to 2.2.   You may start taking the Zytiga and prednisone tomorrow. Take the Zytiga on an empty stomach, wait 1 hour, then take the prednisone with breakfast.   We will see you back in 2-3 weeks. We will repeat lab work one week prior.   Return as scheduled.    Thank you for choosing [facility] at [facility] to provide your oncology and hematology care.  To afford each patient quality time with our provider, please arrive at least 15 minutes before your scheduled appointment time.   If you have a lab appointment with the Cancer Center please come in thru the Main Entrance and check in at the main information desk.  You need to re-schedule your appointment should you arrive 10 or more minutes late.  We strive to give you quality time with our providers, and arriving late affects you and other patients whose appointments are after yours.  Also, if you no show three or more times for appointments you may be dismissed from the clinic at the providers discretion.     Again, thank you for choosing [facility]  Our hope is that these requests will decrease the amount of time that you wait before being seen by our physicians.       _____________________________________________________________  Should you have questions after your visit to [facility], please contact our office at [phone] and follow the prompts.  Our office hours are 8:00 a.m. and 4:30 p.m. Monday - Friday.  Please note that voicemails left after 4:00 p.m. may not be returned until the following business day.  We are closed weekends and major holidays.  You do have access to a nurse 24-7, just call the main number to the  clinic [phone] and do not press any options, hold on the line and a nurse will answer the phone.    For prescription refill requests, have your pharmacy contact our office and allow 72 hours.    Due to Covid, you will need to wear a mask upon entering the hospital. If you do not have a mask, a mask will be given to you at the Main Entrance upon arrival. For doctor visits, patients may have 1 support person age 46 or older with them. For treatment visits, patients can not have anyone with them due to social distancing guidelines and our immunocompromised population.

## 2023-01-11 ENCOUNTER — Other Ambulatory Visit: Payer: Self-pay

## 2023-01-19 DIAGNOSIS — Z2989 Encounter for other specified prophylactic measures: Secondary | ICD-10-CM | POA: Diagnosis not present

## 2023-01-19 DIAGNOSIS — R569 Unspecified convulsions: Secondary | ICD-10-CM | POA: Diagnosis not present

## 2023-01-22 ENCOUNTER — Inpatient Hospital Stay: Payer: Medicare Other | Attending: Hematology

## 2023-01-22 ENCOUNTER — Other Ambulatory Visit: Payer: Medicare Other

## 2023-01-22 DIAGNOSIS — Z87891 Personal history of nicotine dependence: Secondary | ICD-10-CM | POA: Insufficient documentation

## 2023-01-22 DIAGNOSIS — C61 Malignant neoplasm of prostate: Secondary | ICD-10-CM | POA: Insufficient documentation

## 2023-01-22 DIAGNOSIS — D509 Iron deficiency anemia, unspecified: Secondary | ICD-10-CM | POA: Insufficient documentation

## 2023-01-22 DIAGNOSIS — Z7952 Long term (current) use of systemic steroids: Secondary | ICD-10-CM | POA: Insufficient documentation

## 2023-01-22 DIAGNOSIS — D631 Anemia in chronic kidney disease: Secondary | ICD-10-CM | POA: Diagnosis not present

## 2023-01-22 DIAGNOSIS — Z800 Family history of malignant neoplasm of digestive organs: Secondary | ICD-10-CM | POA: Insufficient documentation

## 2023-01-22 DIAGNOSIS — C775 Secondary and unspecified malignant neoplasm of intrapelvic lymph nodes: Secondary | ICD-10-CM | POA: Insufficient documentation

## 2023-01-22 DIAGNOSIS — D508 Other iron deficiency anemias: Secondary | ICD-10-CM

## 2023-01-22 DIAGNOSIS — Z9981 Dependence on supplemental oxygen: Secondary | ICD-10-CM | POA: Insufficient documentation

## 2023-01-22 DIAGNOSIS — J449 Chronic obstructive pulmonary disease, unspecified: Secondary | ICD-10-CM | POA: Insufficient documentation

## 2023-01-22 DIAGNOSIS — N189 Chronic kidney disease, unspecified: Secondary | ICD-10-CM | POA: Diagnosis not present

## 2023-01-22 DIAGNOSIS — Z9079 Acquired absence of other genital organ(s): Secondary | ICD-10-CM | POA: Insufficient documentation

## 2023-01-22 LAB — COMPREHENSIVE METABOLIC PANEL
ALT: 17 U/L (ref 0–44)
AST: 14 U/L — ABNORMAL LOW (ref 15–41)
Albumin: 3.4 g/dL — ABNORMAL LOW (ref 3.5–5.0)
Alkaline Phosphatase: 51 U/L (ref 38–126)
Anion gap: 6 (ref 5–15)
BUN: 20 mg/dL (ref 8–23)
CO2: 37 mmol/L — ABNORMAL HIGH (ref 22–32)
Calcium: 10.1 mg/dL (ref 8.9–10.3)
Chloride: 95 mmol/L — ABNORMAL LOW (ref 98–111)
Creatinine, Ser: 1.16 mg/dL (ref 0.61–1.24)
GFR, Estimated: >60 mL/min (ref >60)
Glucose, Bld: 157 mg/dL — ABNORMAL HIGH (ref 70–99)
Potassium: 4.3 mmol/L (ref 3.5–5.1)
Sodium: 138 mmol/L (ref 135–145)
Total Bilirubin: 0.4 mg/dL (ref <1.2)
Total Protein: 6.4 g/dL — ABNORMAL LOW (ref 6.5–8.1)

## 2023-01-22 LAB — IRON AND TIBC
Iron: 63 ug/dL (ref 45–182)
Saturation Ratios: 19 % (ref 17.9–39.5)
TIBC: 331 ug/dL (ref 250–450)
UIBC: 268 ug/dL

## 2023-01-22 LAB — FOLATE: Folate: 9.1 ng/mL (ref >5.9)

## 2023-01-22 LAB — FERRITIN: Ferritin: 28 ng/mL (ref 24–336)

## 2023-01-22 LAB — VITAMIN B12: Vitamin B-12: 266 pg/mL (ref 180–914)

## 2023-01-23 ENCOUNTER — Other Ambulatory Visit (HOSPITAL_COMMUNITY): Payer: Self-pay

## 2023-01-23 ENCOUNTER — Inpatient Hospital Stay (HOSPITAL_BASED_OUTPATIENT_CLINIC_OR_DEPARTMENT_OTHER): Payer: Medicare Other | Admitting: Hematology

## 2023-01-23 VITALS — BP 150/59 | HR 68 | Temp 97.8°F | Resp 16

## 2023-01-23 DIAGNOSIS — Z800 Family history of malignant neoplasm of digestive organs: Secondary | ICD-10-CM | POA: Diagnosis not present

## 2023-01-23 DIAGNOSIS — J449 Chronic obstructive pulmonary disease, unspecified: Secondary | ICD-10-CM | POA: Diagnosis not present

## 2023-01-23 DIAGNOSIS — D631 Anemia in chronic kidney disease: Secondary | ICD-10-CM | POA: Diagnosis not present

## 2023-01-23 DIAGNOSIS — Z9981 Dependence on supplemental oxygen: Secondary | ICD-10-CM | POA: Diagnosis not present

## 2023-01-23 DIAGNOSIS — C61 Malignant neoplasm of prostate: Secondary | ICD-10-CM

## 2023-01-23 DIAGNOSIS — C775 Secondary and unspecified malignant neoplasm of intrapelvic lymph nodes: Secondary | ICD-10-CM | POA: Diagnosis not present

## 2023-01-23 DIAGNOSIS — Z87891 Personal history of nicotine dependence: Secondary | ICD-10-CM | POA: Diagnosis not present

## 2023-01-23 DIAGNOSIS — D509 Iron deficiency anemia, unspecified: Secondary | ICD-10-CM | POA: Insufficient documentation

## 2023-01-23 DIAGNOSIS — N189 Chronic kidney disease, unspecified: Secondary | ICD-10-CM | POA: Diagnosis not present

## 2023-01-23 DIAGNOSIS — Z9079 Acquired absence of other genital organ(s): Secondary | ICD-10-CM | POA: Diagnosis not present

## 2023-01-23 DIAGNOSIS — Z7952 Long term (current) use of systemic steroids: Secondary | ICD-10-CM | POA: Diagnosis not present

## 2023-01-23 NOTE — Patient Instructions (Addendum)
[facility] at [facility] Discharge Instructions   You were seen and examined today by Dr. Ellin Saba.  He reviewed the results of your lab work which are mostly normal/stable. Your iron is low. Dr. Kirtland Bouchard thinks you would benefit from IV iron infusions. Your insurance covers an iron preparation called Feraheme. This is given in 2 doses, one week apart. You will receive this here in the clinic. Please take Tyelenol 650 mg and Benadryl 25 mg at home prior to coming to the clinic to get these infusions.   Continue Zytiga and prednisone as prescribed.   We will see you back in 5 weeks. We will repeat lab work prior to this visit.   Return as scheduled.    Thank you for choosing [facility] at [facility] to provide your oncology and hematology care.  To afford each patient quality time with our provider, please arrive at least 15 minutes before your scheduled appointment time.   If you have a lab appointment with the Cancer Center please come in thru the Main Entrance and check in at the main information desk.  You need to re-schedule your appointment should you arrive 10 or more minutes late.  We strive to give you quality time with our providers, and arriving late affects you and other patients whose appointments are after yours.  Also, if you no show three or more times for appointments you may be dismissed from the clinic at the providers discretion.     Again, thank you for choosing [facility].  Our hope is that these requests will decrease the amount of time that you wait before being seen by our physicians.       _____________________________________________________________  Should you have questions after your visit to [facility], please contact our office at [phone] and follow the prompts.  Our office hours are 8:00 a.m. and 4:30 p.m. Monday - Friday.  Please note that voicemails left after 4:00 p.m. may not be  returned until the following business day.  We are closed weekends and major holidays.  You do have access to a nurse 24-7, just call the main number to the clinic [phone] and do not press any options, hold on the line and a nurse will answer the phone.    For prescription refill requests, have your pharmacy contact our office and allow 72 hours.    Due to Covid, you will need to wear a mask upon entering the hospital. If you do not have a mask, a mask will be given to you at the Main Entrance upon arrival. For doctor visits, patients may have 1 support person age 71 or older with them. For treatment visits, patients can not have anyone with them due to social distancing guidelines and our immunocompromised population.

## 2023-01-23 NOTE — Progress Notes (Signed)
Specialty pharmacy outreach- Re-timing refill and new therapy check-in call at patient's request.

## 2023-01-23 NOTE — Progress Notes (Signed)
[facility] [address], [place] [address]    Clinic Day:  01/23/2023  Referring physician: Toma Deiters, MD  Patient Care Team: Toma Deiters, MD as PCP - General (Internal Medicine) Doreatha Massed, MD as Medical Oncologist (Medical Oncology) Therese Sarah, RN as Oncology Nurse Navigator (Medical Oncology)   ASSESSMENT & PLAN:   Assessment: 1.  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  Had biochemical recurrence and was started on Lupron. - PSMA PET scan (12/01/2021): Multiple small intensely radiotracer avid prostate cancer metastasis to the pelvic lymph nodes involving right common iliac, right external iliac and right internal iliac nodes.  Most superior metastatic node at the L4 vertebral body level.  No visceral or skeletal metastatic disease. - Lupron every 3 months started on 03/26/2019 - Enzalutamide started on 12/07/2021 with improvement in PSA.  Discontinued after sustaining seizure on 07/17/2022.  PSA has been rising since then. - Germline mutation testing: Negative - PSMA PET (12/28/2022): Near identical pattern with the right internal iliac lymph node 10 mm SUV 30.7, right external iliac lymph node.  No new pelvic lymph nodes.  No visceral or bone metastatic disease. - Zytiga and prednisone started on 01/05/2023   2.  Social/family history: - Lives at home with his wife.  On oxygen secondary to COPD since 2008, 4 L/min.  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  Also worked in Recruitment consultant.  Quit smoking in 1985, smoked 1 pack/day for 30 years. - Mother died of colon cancer.    Plan: 1.  Metastatic CRPC to pelvic lymph nodes: - PET scan showed metastatic lymph nodes in the right iliac region with no bone lesions visible. - PSA increased to 0.23. - Zytiga 1000 mg with prednisone started on 01/05/2023. - I have  reviewed his blood pressure readings from home.  Systolic blood pressure was ranging between 120-140.  He was told to continue to monitor blood pressure at home. - No significant side effects were noted with Zytiga. - Reviewed labs today: Potassium 4.3.  Creatinine was normal.  Calcium 10.1, albumin 3.4.  Rest of the LFTs were normal. - Continue Zytiga 1000 mg daily and prednisone 5 mg daily.  RTC 5 weeks for follow-up with repeat labs and PSA.  2.  Normocytic anemia: - Combination anemia from CKD and iron deficiency. - Ferritin is 28 and percent saturation 19.  Folic acid is normal.  B12 is 266. - Recommend Feraheme weekly x 2.  Discussed side effects including rare chance of anaphylactic reaction.  We will schedule it.    Orders Placed This Encounter  Procedures   CBC with Differential    Standing Status:   Future    Standing Expiration Date:   01/23/2024   Comprehensive metabolic panel    Standing Status:   Future    Standing Expiration Date:   01/23/2024   PSA    Standing Status:   Future    Standing Expiration Date:   01/23/2024      Mikeal Hawthorne R Teague,acting as a scribe for Doreatha Massed, MD.,have documented all relevant documentation on the behalf of Doreatha Massed, MD,as directed by  Doreatha Massed, MD while in the presence of Doreatha Massed, MD.  I, Doreatha Massed MD, have reviewed the above documentation for accuracy and completeness, and I agree with the above.    Doreatha Massed,  MD   11/12/20245:29 PM  CHIEF COMPLAINT:   Diagnosis: metastatic prostate cancer to pelvic lymph nodes    Cancer Staging  No matching staging information was found for the patient.    Prior Therapy: 1. prostatectomy, 2003 2. Diana Eves, 11/2021 - 07/2022, discontinued due to seizures  Current Therapy:  ADT, Zytiga and prednisone   HISTORY OF PRESENT ILLNESS:   Oncology History   No history exists.     INTERVAL HISTORY:   Danny Martinez is a 89 y.o. male  presenting to clinic today for follow up of metastatic prostate cancer to pelvic lymph nodes. He was last seen by me on 01/03/23.  Today, he states that he is doing well overall. His appetite level is at 100%. His energy level is at 75%. He is accompanied by his wife. He started [place] on 01/05/23 and is taking as prescribed. He is also taking Prednisone as prescribed. He denies any nausea, vomiting, or diarrhea. His constipation is stable. His blood pressure has improved since starting Zytiga in the diastolic range of 120's-130's. He reports an allergy to atorvastatin that causes a rash similar to measles and a dry, hacking cough. He will discontinue Keppra after this visit.   PAST MEDICAL HISTORY:   Past Medical History: Past Medical History:  Diagnosis Date   COPD (chronic obstructive pulmonary disease) (HCC)    Emphysema    History of prostate cancer    Hypoxemia    Pulmonary hypertension (HCC)    PVC (premature ventricular contraction)     Surgical History: Past Surgical History:  Procedure Laterality Date   CYSTECTOMY     PROSTATECTOMY      Social History: Social History   Socioeconomic History   Marital status: Married    Spouse name: Not on file   Number of children: Not on file   Years of education: Not on file   Highest education level: Not on file  Occupational History   Occupation: retired    Associate Professor: RETIRED  Tobacco Use   Smoking status: Former    Current packs/day: 0.00    Average packs/day: 1 pack/day for 20.0 years (20.0 ttl pk-yrs)    Types: Cigarettes    Start date: 03/13/1962    Quit date: 03/13/1982    Years since quitting: 40.8   Smokeless tobacco: Never  Substance and Sexual Activity   Alcohol use: No   Drug use: No   Sexual activity: Not on file  Other Topics Concern   Not on file  Social History Narrative   Not on file   Social Determinants of Health   Financial Resource Strain: Low Risk  (08/14/2022)   Received from [facility], [facility]   Overall Financial Resource Strain (CARDIA)    Difficulty of Paying Living Expenses: Not hard at all  Food Insecurity: No Food Insecurity (08/14/2022)   Received from [facility], [facility]   Hunger Vital Sign    Worried About Running Out of Food in the Last Year: Never true    Ran Out of Food in the Last Year: Never true  Transportation Needs: No Transportation Needs (08/14/2022)   Received from [employer], [facility]   PRAPARE - Transportation    Lack of Transportation (Medical): No    Lack of Transportation (Non-Medical): No  Physical Activity: Not on file  Stress: No Stress Concern Present (07/18/2022)   Received from [facility], [facility] - Occupational Stress Questionnaire    Feeling  of Stress : Not at all  Social Connections: Unknown (07/17/2022)   Received from [facility], [facility]   Social Network    Social Network: Not on file  Intimate Partner Violence: Not At Risk (07/18/2022)   Received from [facility], [facility]   HITS    Over the last 12 months how often did your partner physically hurt you?: Never    Over the last 12 months how often did your partner insult you or talk down to you?: Never    Over the last 12 months how often did your partner threaten you with physical harm?: Never    Over the last 12 months how often did your partner scream or curse at you?: Never    Family History: Family History  Problem Relation Age of Onset   Sarcoidosis Brother    Heart disease Father    Rheum arthritis Sister    Rheum arthritis Sister    Rheum arthritis Father    Cancer Mother     Current Medications:  Current Outpatient Medications:    abiraterone acetate (ZYTIGA) 250 MG tablet, Take 4 tablets (1,000 mg total) by mouth daily. Take on an empty stomach 1 hour before or 2 hours after a meal, Disp: 120 tablet, Rfl: 0   albuterol (PROVENTIL) (2.5 MG/3ML) 0.083% nebulizer solution, Take 2.5 mg by  nebulization every 4 (four) hours as needed., Disp: , Rfl:    apixaban (ELIQUIS) 5 MG TABS tablet, Take by mouth., Disp: , Rfl:    aspirin 81 MG chewable tablet, Chew by mouth., Disp: , Rfl:    atenolol (TENORMIN) 50 MG tablet, Take 50 mg by mouth daily., Disp: , Rfl:    atorvastatin (LIPITOR) 20 MG tablet, Take by mouth., Disp: , Rfl:    Calcium Carb-Cholecalciferol (OYSTER SHELL CALCIUM W/D) 500-5 MG-MCG TABS, Take 1 tablet by mouth 2 (two) times daily., Disp: , Rfl:    Cholecalciferol (VITAMIN D-3) 5000 UNITS TABS, Take 1 tablet by mouth daily., Disp: , Rfl:    docusate sodium (COLACE) 100 MG capsule, Take 100 mg by mouth 2 (two) times daily., Disp: , Rfl:    famotidine (PEPCID) 20 MG tablet, Take 20 mg by mouth 2 (two) times daily., Disp: , Rfl:    Fluticasone-Salmeterol (ADVAIR) 250-50 MCG/DOSE AEPB, Inhale 1 puff into the lungs every 12 (twelve) hours., Disp: , Rfl:    glimepiride (AMARYL) 1 MG tablet, Take 1 mg by mouth daily before breakfast., Disp: , Rfl:    HM SENNA 8.6 MG tablet, Take 2 tablets by mouth 2 (two) times daily., Disp: , Rfl:    levETIRAcetam (KEPPRA) 250 MG tablet, Take by mouth., Disp: , Rfl:    levETIRAcetam (KEPPRA) 500 MG tablet, Take 250 mg by mouth 2 (two) times daily., Disp: , Rfl:    LINZESS 72 MCG capsule, Take 72 mcg by mouth daily., Disp: , Rfl:    metoprolol tartrate (LOPRESSOR) 25 MG tablet, Take by mouth., Disp: , Rfl:    OXYGEN, Inhale 3.5 L into the lungs., Disp: , Rfl:    predniSONE (DELTASONE) 5 MG tablet, Take 1 tablet (5 mg total) by mouth daily with breakfast., Disp: 30 tablet, Rfl: 3   sodium chloride, hypertonic, 3 % solution, SMARTSIG:3 Milliliter(s) Via Nebulizer Twice Daily PRN, Disp: , Rfl:    telmisartan-hydrochlorothiazide (MICARDIS HCT) 80-25 MG tablet, Take 1 tablet by mouth daily., Disp: , Rfl:    tiotropium (SPIRIVA) 18 MCG inhalation capsule, Place 18 mcg into inhaler and inhale  daily., Disp: , Rfl:   Current Facility-Administered  Medications:    Leuprolide Acetate (3 Month) (ELIGARD) 22.5 MG injection 22.5 mg, 22.5 mg, Subcutaneous, Q90 days, McKenzie, Mardene Celeste, MD, 22.5 mg at 09/08/22 1209   Allergies: Allergies  Allergen Reactions   Neomycin-Bacitracin Zn-Polymyx     REACTION: rash    REVIEW OF SYSTEMS:   Review of Systems  Constitutional:  Negative for chills, fatigue and fever.  HENT:   Negative for lump/mass, mouth sores, nosebleeds, sore throat and trouble swallowing.   Eyes:  Negative for eye problems.  Respiratory:  Negative for cough and shortness of breath.   Cardiovascular:  Negative for chest pain, leg swelling and palpitations.  Gastrointestinal:  Positive for constipation. Negative for abdominal pain, diarrhea, nausea and vomiting.  Genitourinary:  Negative for bladder incontinence, difficulty urinating, dysuria, frequency, hematuria and nocturia.   Musculoskeletal:  Negative for arthralgias, back pain, flank pain, myalgias and neck pain.  Skin:  Negative for itching and rash.  Neurological:  Negative for dizziness, headaches and numbness.  Hematological:  Does not bruise/bleed easily.  Psychiatric/Behavioral:  Negative for depression, sleep disturbance and suicidal ideas. The patient is not nervous/anxious.   All other systems reviewed and are negative.    VITALS:   Blood pressure (!) 150/59, pulse 68, temperature 97.8 F (36.6 C), temperature source Oral, resp. rate 16, SpO2 99%.  Wt Readings from Last 3 Encounters:  01/03/23 226 lb (102.5 kg)  12/18/22 221 lb (100.2 kg)  10/24/21 219 lb (99.3 kg)    There is no height or weight on file to calculate BMI.  Performance status (ECOG): 1 - Symptomatic but completely ambulatory  PHYSICAL EXAM:   Physical Exam Vitals and nursing note reviewed. Exam conducted with a chaperone present.  Constitutional:      Appearance: Normal appearance.  Cardiovascular:     Rate and Rhythm: Normal rate and regular rhythm.     Pulses: Normal pulses.      Heart sounds: Normal heart sounds.  Pulmonary:     Effort: Pulmonary effort is normal.     Breath sounds: Normal breath sounds.  Abdominal:     Palpations: Abdomen is soft. There is no hepatomegaly, splenomegaly or mass.     Tenderness: There is no abdominal tenderness.  Musculoskeletal:     Right lower leg: No edema.     Left lower leg: No edema.  Lymphadenopathy:     Cervical: No cervical adenopathy.     Right cervical: No superficial, deep or posterior cervical adenopathy.    Left cervical: No superficial, deep or posterior cervical adenopathy.     Upper Body:     Right upper body: No supraclavicular or axillary adenopathy.     Left upper body: No supraclavicular or axillary adenopathy.  Neurological:     General: No focal deficit present.     Mental Status: He is alert and oriented to person, place, and time.  Psychiatric:        Mood and Affect: Mood normal.        Behavior: Behavior normal.     LABS:      Latest Ref Rng & Units 12/18/2022    1:51 PM 10/23/2006    3:50 AM 10/22/2006    3:50 AM  CBC  WBC 4.0 - 10.5 K/uL 6.2  7.2  8.0   Hemoglobin 13.0 - 17.0 g/dL 9.3  9.7  20.2   Hematocrit 39.0 - 52.0 % 31.6  29.7  31.2   Platelets  150 - 400 K/uL 148  353  349       Latest Ref Rng & Units 01/22/2023   10:33 AM 12/18/2022    1:51 PM 04/18/2022    9:31 AM  CMP  Glucose 70 - 99 mg/dL 161  82  096   BUN 8 - 23 mg/dL 20  20  20    Creatinine 0.61 - 1.24 mg/dL 0.45  4.09  8.11   Sodium 135 - 145 mmol/L 138  135  143   Potassium 3.5 - 5.1 mmol/L 4.3  3.8  4.8   Chloride 98 - 111 mmol/L 95  93  99   CO2 22 - 32 mmol/L 37  35  29   Calcium 8.9 - 10.3 mg/dL 91.4  9.6  78.2   Total Protein 6.5 - 8.1 g/dL 6.4  6.8  6.5   Total Bilirubin <1.2 mg/dL 0.4  0.3  <9.5   Alkaline Phos 38 - 126 U/L 51  42  49   AST 15 - 41 U/L 14  16  13    ALT 0 - 44 U/L 17  13  7       No results found for: "CEA1", "CEA" / No results found for: "CEA1", "CEA" Lab Results  Component Value  Date   PSA1 1.6 11/21/2022   No results found for: "AOZ308" No results found for: "CAN125"  No results found for: "TOTALPROTELP", "ALBUMINELP", "A1GS", "A2GS", "BETS", "BETA2SER", "GAMS", "MSPIKE", "SPEI" Lab Results  Component Value Date   TIBC 331 01/22/2023   FERRITIN 28 01/22/2023   IRONPCTSAT 19 01/22/2023   No results found for: "LDH"   STUDIES:   NM PET (PSMA) SKULL TO MID THIGH  Result Date: 01/03/2023 CLINICAL DATA:  Prostate cancer nodal metastasis. EXAM: NUCLEAR MEDICINE PET SKULL BASE TO THIGH TECHNIQUE: 8.5 mCi Flotufolastat (Posluma) was injected intravenously. Full-ring PET imaging was performed from the skull base to thigh after the radiotracer. CT data was obtained and used for attenuation correction and anatomic localization. COMPARISON:  PSMA PET scan 12/01/2021 FINDINGS: NECK No radiotracer activity in neck lymph nodes. Incidental CT finding: None. CHEST No radiotracer accumulation within mediastinal or hilar lymph nodes. No suspicious pulmonary nodules on the CT scan. Incidental CT finding: Marked symmetric bilateral gynecomastia. No change from prior. ABDOMEN/PELVIS Prostate: No focal activity in the prostate bed. Lymph nodes: Again demonstrated intense radiotracer avid iRIGHT common iliac, internal iliac and external iliac lymph nodes. Near identical pattern to comparison exam. for example RIGHT internal iliac lymph node along the operator fossa measures 10 mm (127) compared to 10 mm on prior with SUV max equal 30.7 compared SUV max equal 43.5. RIGHT external iliac node on image 148 measures 5 mm with SUV max equal 33.5 compared SUV max equal 43.5 on prior. No new pelvic lymph nodes. No radiotracer avid lymph nodes present outside of the pelvis. Liver: No evidence of liver metastasis. Incidental CT finding: None. SKELETON No focal activity to suggest skeletal metastasis. IMPRESSION: 1. Near identical pattern of intense radiotracer avid metastatic RIGHT iliac lymph nodes. No  evidence of progression or improvement from comparison exam. 2. No evidence of local recurrence in the  prostatectomy bed. 3. No evidence of visceral metastasis or skeletal metastasis. 4. Marked symmetric bilateral gynecomastia.  No interval change. Electronically Signed   By: Genevive Bi M.D.   On: 01/03/2023 12:50

## 2023-01-23 NOTE — Progress Notes (Signed)
Patient is taking Zytiga as prescribed.  He has not missed any doses and reports no side effects at this time.   

## 2023-01-24 ENCOUNTER — Other Ambulatory Visit: Payer: Self-pay

## 2023-01-24 LAB — COPPER, SERUM: Copper: 107 ug/dL (ref 69–132)

## 2023-01-26 ENCOUNTER — Other Ambulatory Visit: Payer: Self-pay

## 2023-01-26 ENCOUNTER — Other Ambulatory Visit: Payer: Self-pay | Admitting: Hematology

## 2023-01-26 ENCOUNTER — Other Ambulatory Visit (HOSPITAL_COMMUNITY): Payer: Self-pay

## 2023-01-26 LAB — METHYLMALONIC ACID, SERUM: Methylmalonic Acid, Quantitative: 404 nmol/L — ABNORMAL HIGH (ref 0–378)

## 2023-01-26 NOTE — Progress Notes (Signed)
Specialty Pharmacy Ongoing Clinical Assessment Note  Danny Martinez is a 89 y.o. male who is being followed by the specialty pharmacy service for RxSp Oncology   Patient's specialty medication(s) reviewed today: Abiraterone Acetate   Missed doses in the last 4 weeks: 0   Patient/Caregiver did not have any additional questions or concerns.   Therapeutic benefit summary: Unable to assess   Adverse events/side effects summary: No adverse events/side effects   Patient's therapy is appropriate to: Continue    Goals Addressed             This Visit's Progress    Stabilization of disease       Patient is on track. Patient will maintain adherence.          Follow up:  3 months  Bobette Mo Specialty Pharmacist

## 2023-01-26 NOTE — Progress Notes (Signed)
Specialty Pharmacy Refill Coordination Note  Danny Martinez is a 89 y.o. male contacted today regarding refills of specialty medication(s) Abiraterone Acetate   Patient requested Delivery   Delivery date: 01/31/23   Verified address: [address]   [place] [address]   Medication will be filled on 01/30/23 pending a refill request.

## 2023-01-30 ENCOUNTER — Other Ambulatory Visit: Payer: Self-pay | Admitting: Hematology

## 2023-01-30 ENCOUNTER — Other Ambulatory Visit (HOSPITAL_COMMUNITY): Payer: Self-pay

## 2023-01-30 ENCOUNTER — Other Ambulatory Visit: Payer: Self-pay

## 2023-01-30 MED ORDER — ABIRATERONE ACETATE 250 MG PO TABS
1000.0000 mg | ORAL_TABLET | Freq: Every day | ORAL | 2 refills | Status: DC
  Filled 2023-01-30: qty 120, 30d supply, fill #0
  Filled 2023-02-22: qty 120, 30d supply, fill #1
  Filled 2023-03-21: qty 120, 30d supply, fill #2

## 2023-01-30 NOTE — Telephone Encounter (Signed)
Chart reviewed. Zytiga refilled per last office note with Dr. Delton Coombes

## 2023-01-31 ENCOUNTER — Inpatient Hospital Stay: Payer: Medicare Other

## 2023-01-31 VITALS — BP 134/53 | HR 54 | Temp 97.9°F | Resp 17

## 2023-01-31 DIAGNOSIS — Z800 Family history of malignant neoplasm of digestive organs: Secondary | ICD-10-CM | POA: Diagnosis not present

## 2023-01-31 DIAGNOSIS — J449 Chronic obstructive pulmonary disease, unspecified: Secondary | ICD-10-CM | POA: Diagnosis not present

## 2023-01-31 DIAGNOSIS — D509 Iron deficiency anemia, unspecified: Secondary | ICD-10-CM

## 2023-01-31 DIAGNOSIS — Z7952 Long term (current) use of systemic steroids: Secondary | ICD-10-CM | POA: Diagnosis not present

## 2023-01-31 DIAGNOSIS — C61 Malignant neoplasm of prostate: Secondary | ICD-10-CM | POA: Diagnosis not present

## 2023-01-31 DIAGNOSIS — D631 Anemia in chronic kidney disease: Secondary | ICD-10-CM | POA: Diagnosis not present

## 2023-01-31 DIAGNOSIS — Z9079 Acquired absence of other genital organ(s): Secondary | ICD-10-CM | POA: Diagnosis not present

## 2023-01-31 DIAGNOSIS — Z9981 Dependence on supplemental oxygen: Secondary | ICD-10-CM | POA: Diagnosis not present

## 2023-01-31 DIAGNOSIS — N189 Chronic kidney disease, unspecified: Secondary | ICD-10-CM | POA: Diagnosis not present

## 2023-01-31 DIAGNOSIS — C775 Secondary and unspecified malignant neoplasm of intrapelvic lymph nodes: Secondary | ICD-10-CM | POA: Diagnosis not present

## 2023-01-31 DIAGNOSIS — Z87891 Personal history of nicotine dependence: Secondary | ICD-10-CM | POA: Diagnosis not present

## 2023-01-31 MED ORDER — SODIUM CHLORIDE 0.9 % IV SOLN
510.0000 mg | Freq: Once | INTRAVENOUS | Status: AC
  Administered 2023-01-31: 510 mg via INTRAVENOUS
  Filled 2023-01-31: qty 510

## 2023-01-31 MED ORDER — SODIUM CHLORIDE 0.9 % IV SOLN: INTRAVENOUS | Status: DC

## 2023-01-31 NOTE — Progress Notes (Signed)
Patient presents today for iron infusion. Patient is in satisfactory condition with no new complaints voiced.  Vital signs are stable.  We will proceed with infusion per provider orders.    Peripheral IV started with good blood return pre and post infusion.  Feraheme 510 mg given today per MD orders. Tolerated infusion without adverse affects. Vital signs stable. No complaints at this time. Discharged from clinic via wheelchair in stable condition. Alert and oriented x 3. F/U with [facility] as scheduled.   

## 2023-01-31 NOTE — Patient Instructions (Signed)
 [facility] - A DEPT OF [facility]  Discharge Instructions: Thank you for choosing [facility] to provide your oncology and hematology care.  If you have a lab appointment with the Cancer Center - please note that after April 8th, 2024, all labs will be drawn in the cancer center.  You do not have to check in or register with the main entrance as you have in the past but will complete your check-in in the cancer center.  Wear comfortable clothing and clothing appropriate for easy access to any Portacath or PICC line.   We strive to give you quality time with your provider. You may need to reschedule your appointment if you arrive late (15 or more minutes).  Arriving late affects you and other patients whose appointments are after yours.  Also, if you miss three or more appointments without notifying the office, you may be dismissed from the clinic at the provider's discretion.      For prescription refill requests, have your pharmacy contact our office and allow 72 hours for refills to be completed.    Today you received Feraheme IV iron infusion.   BELOW ARE SYMPTOMS THAT SHOULD BE REPORTED IMMEDIATELY: *FEVER GREATER THAN 100.4 F (38 C) OR HIGHER *CHILLS OR SWEATING *NAUSEA AND VOMITING THAT IS NOT CONTROLLED WITH YOUR NAUSEA MEDICATION *UNUSUAL SHORTNESS OF BREATH *UNUSUAL BRUISING OR BLEEDING *URINARY PROBLEMS (pain or burning when urinating, or frequent urination) *BOWEL PROBLEMS (unusual diarrhea, constipation, pain near the anus) TENDERNESS IN MOUTH AND THROAT WITH OR WITHOUT PRESENCE OF ULCERS (sore throat, sores in mouth, or a toothache) UNUSUAL RASH, SWELLING OR PAIN  UNUSUAL VAGINAL DISCHARGE OR ITCHING   Items with * indicate a potential emergency and should be followed up as soon as possible or go to the Emergency Department if any problems should occur.  Please show the CHEMOTHERAPY ALERT CARD or IMMUNOTHERAPY ALERT CARD at  check-in to the Emergency Department and triage nurse.  Should you have questions after your visit or need to cancel or reschedule your appointment, please contact [facility] - A DEPT OF [facility] [phone]  and follow the prompts.  Office hours are 8:00 a.m. to 4:30 p.m. Monday - Friday. Please note that voicemails left after 4:00 p.m. may not be returned until the following business day.  We are closed weekends and major holidays. You have access to a nurse at all times for urgent questions. Please call the main number to the clinic [phone] and follow the prompts.  For any non-urgent questions, you may also contact your provider using MyChart. We now offer e-Visits for anyone 32 and older to request care online for non-urgent symptoms. For details visit [email or web address].   Also download the MyChart app! Go to the app store, search "MyChart", open the app, select [place], and log in with your MyChart username and password.

## 2023-02-06 ENCOUNTER — Other Ambulatory Visit (HOSPITAL_COMMUNITY): Payer: Self-pay

## 2023-02-06 DIAGNOSIS — E1165 Type 2 diabetes mellitus with hyperglycemia: Secondary | ICD-10-CM | POA: Diagnosis not present

## 2023-02-06 DIAGNOSIS — J9611 Chronic respiratory failure with hypoxia: Secondary | ICD-10-CM | POA: Diagnosis not present

## 2023-02-06 DIAGNOSIS — D509 Iron deficiency anemia, unspecified: Secondary | ICD-10-CM | POA: Diagnosis not present

## 2023-02-06 DIAGNOSIS — I480 Paroxysmal atrial fibrillation: Secondary | ICD-10-CM | POA: Diagnosis not present

## 2023-02-07 ENCOUNTER — Other Ambulatory Visit (HOSPITAL_COMMUNITY): Payer: Self-pay

## 2023-02-07 ENCOUNTER — Other Ambulatory Visit: Payer: Self-pay

## 2023-02-07 NOTE — Progress Notes (Signed)
Added note to Specialty Program to make sure grant is billed Secondary to Primary Medicare Part D insurance for $0.00 co-pay for patient's Zytiga (abiraterone).    Ardeen Fillers, CPhT Oncology Pharmacy Patient Advocate  [facility]  [phone] (phone) [phone] (fax) 02/07/2023 8:58 AM

## 2023-02-12 ENCOUNTER — Inpatient Hospital Stay: Payer: Medicare Other | Attending: Hematology

## 2023-02-12 VITALS — BP 143/78 | HR 70 | Temp 97.0°F | Resp 20

## 2023-02-12 DIAGNOSIS — C61 Malignant neoplasm of prostate: Secondary | ICD-10-CM | POA: Diagnosis not present

## 2023-02-12 DIAGNOSIS — N189 Chronic kidney disease, unspecified: Secondary | ICD-10-CM | POA: Diagnosis not present

## 2023-02-12 DIAGNOSIS — C775 Secondary and unspecified malignant neoplasm of intrapelvic lymph nodes: Secondary | ICD-10-CM | POA: Diagnosis not present

## 2023-02-12 DIAGNOSIS — D631 Anemia in chronic kidney disease: Secondary | ICD-10-CM | POA: Insufficient documentation

## 2023-02-12 DIAGNOSIS — D509 Iron deficiency anemia, unspecified: Secondary | ICD-10-CM | POA: Insufficient documentation

## 2023-02-12 MED ORDER — SODIUM CHLORIDE 0.9 % IV SOLN
510.0000 mg | Freq: Once | INTRAVENOUS | Status: AC
  Administered 2023-02-12: 510 mg via INTRAVENOUS
  Filled 2023-02-12: qty 17

## 2023-02-12 MED ORDER — SODIUM CHLORIDE 0.9 % IV SOLN: INTRAVENOUS | Status: DC

## 2023-02-12 NOTE — Progress Notes (Signed)
Patient took premeds at home.   Patient tolerated iron infusion with no complaints voiced.  Peripheral IV site clean and dry with good blood return noted before and after infusion.  Band aid applied.  VSS with discharge and left in satisfactory condition with no s/s of distress noted.

## 2023-02-12 NOTE — Patient Instructions (Signed)

## 2023-02-20 ENCOUNTER — Other Ambulatory Visit: Payer: Self-pay

## 2023-02-21 DIAGNOSIS — G40909 Epilepsy, unspecified, not intractable, without status epilepticus: Secondary | ICD-10-CM | POA: Diagnosis not present

## 2023-02-21 DIAGNOSIS — E1122 Type 2 diabetes mellitus with diabetic chronic kidney disease: Secondary | ICD-10-CM | POA: Diagnosis not present

## 2023-02-21 DIAGNOSIS — I480 Paroxysmal atrial fibrillation: Secondary | ICD-10-CM | POA: Diagnosis not present

## 2023-02-21 DIAGNOSIS — J9612 Chronic respiratory failure with hypercapnia: Secondary | ICD-10-CM | POA: Diagnosis not present

## 2023-02-22 ENCOUNTER — Encounter: Payer: Self-pay | Admitting: Hematology

## 2023-02-22 ENCOUNTER — Other Ambulatory Visit (HOSPITAL_COMMUNITY): Payer: Self-pay

## 2023-02-22 NOTE — Progress Notes (Signed)
Specialty Pharmacy Refill Coordination Note  Danny Martinez is a 89 y.o. male contacted today regarding refills of specialty medication(s) Abiraterone Acetate [name]   Patient requested Delivery   Delivery date: 03/01/23   Verified address: [address] [place] [address]   Medication will be filled on 02/28/23.

## 2023-02-27 ENCOUNTER — Inpatient Hospital Stay: Payer: Medicare Other

## 2023-02-27 DIAGNOSIS — C775 Secondary and unspecified malignant neoplasm of intrapelvic lymph nodes: Secondary | ICD-10-CM | POA: Diagnosis not present

## 2023-02-27 DIAGNOSIS — D509 Iron deficiency anemia, unspecified: Secondary | ICD-10-CM | POA: Diagnosis not present

## 2023-02-27 DIAGNOSIS — C61 Malignant neoplasm of prostate: Secondary | ICD-10-CM

## 2023-02-27 DIAGNOSIS — N189 Chronic kidney disease, unspecified: Secondary | ICD-10-CM | POA: Diagnosis not present

## 2023-02-27 DIAGNOSIS — D631 Anemia in chronic kidney disease: Secondary | ICD-10-CM | POA: Diagnosis not present

## 2023-02-27 LAB — CBC WITH DIFFERENTIAL/PLATELET
Abs Immature Granulocytes: 0.02 10*3/uL (ref 0.00–0.07)
Basophils Absolute: 0 10*3/uL (ref 0.0–0.1)
Basophils Relative: 1 %
Eosinophils Absolute: 0.2 10*3/uL (ref 0.0–0.5)
Eosinophils Relative: 3 %
HCT: 34.5 % — ABNORMAL LOW (ref 39.0–52.0)
Hemoglobin: 10.4 g/dL — ABNORMAL LOW (ref 13.0–17.0)
Immature Granulocytes: 0 %
Lymphocytes Relative: 31 %
Lymphs Abs: 2.1 10*3/uL (ref 0.7–4.0)
MCH: 29.5 pg (ref 26.0–34.0)
MCHC: 30.1 g/dL (ref 30.0–36.0)
MCV: 98 fL (ref 80.0–100.0)
Monocytes Absolute: 1 10*3/uL (ref 0.1–1.0)
Monocytes Relative: 15 %
Neutro Abs: 3.3 10*3/uL (ref 1.7–7.7)
Neutrophils Relative %: 50 %
Platelets: 148 10*3/uL — ABNORMAL LOW (ref 150–400)
RBC: 3.52 MIL/uL — ABNORMAL LOW (ref 4.22–5.81)
RDW: 14.3 % (ref 11.5–15.5)
WBC: 6.6 10*3/uL (ref 4.0–10.5)
nRBC: 0 % (ref 0.0–0.2)

## 2023-02-27 LAB — COMPREHENSIVE METABOLIC PANEL
ALT: 24 U/L (ref 0–44)
AST: 21 U/L (ref 15–41)
Albumin: 3.7 g/dL (ref 3.5–5.0)
Alkaline Phosphatase: 45 U/L (ref 38–126)
Anion gap: 5 (ref 5–15)
BUN: 18 mg/dL (ref 8–23)
CO2: 38 mmol/L — ABNORMAL HIGH (ref 22–32)
Calcium: 10.5 mg/dL — ABNORMAL HIGH (ref 8.9–10.3)
Chloride: 93 mmol/L — ABNORMAL LOW (ref 98–111)
Creatinine, Ser: 0.97 mg/dL (ref 0.61–1.24)
GFR, Estimated: >60 mL/min (ref >60)
Glucose, Bld: 107 mg/dL — ABNORMAL HIGH (ref 70–99)
Potassium: 3.9 mmol/L (ref 3.5–5.1)
Sodium: 136 mmol/L (ref 135–145)
Total Bilirubin: 0.5 mg/dL (ref <1.2)
Total Protein: 6.7 g/dL (ref 6.5–8.1)

## 2023-02-27 LAB — PSA: Prostatic Specific Antigen: 1.04 ng/mL (ref 0.00–4.00)

## 2023-02-28 ENCOUNTER — Encounter: Payer: Self-pay | Admitting: Hematology

## 2023-02-28 ENCOUNTER — Other Ambulatory Visit: Payer: Self-pay

## 2023-02-28 NOTE — Progress Notes (Signed)
[facility] [address] [place] [address]    Clinic Day:  03/01/2023  Referring physician: Toma Deiters, MD  Patient Care Team: Toma Deiters, MD as PCP - General (Internal Medicine) Doreatha Massed, MD as Medical Oncologist (Medical Oncology) Therese Sarah, RN as Oncology Nurse Navigator (Medical Oncology)   ASSESSMENT & PLAN:   Assessment: 1.  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  Had biochemical recurrence and was started on Lupron. - PSMA PET scan (12/01/2021): Multiple small intensely radiotracer avid prostate cancer metastasis to the pelvic lymph nodes involving right common iliac, right external iliac and right internal iliac nodes.  Most superior metastatic node at the L4 vertebral body level.  No visceral or skeletal metastatic disease. - Lupron every 3 months started on 03/26/2019 - Enzalutamide started on 12/07/2021 with improvement in PSA.  Discontinued after sustaining seizure on 07/17/2022.  PSA has been rising since then. - Germline mutation testing: Negative - PSMA PET (12/28/2022): Near identical pattern with the right internal iliac lymph node 10 mm SUV 30.7, right external iliac lymph node.  No new pelvic lymph nodes.  No visceral or bone metastatic disease. - Zytiga and prednisone started on 01/05/2023   2.  Social/family history: - Lives at home with his wife.  On oxygen secondary to COPD since 2008, 4 L/min.  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  Also worked in Recruitment consultant.  Quit smoking in 1985, smoked 1 pack/day for 30 years. - Mother died of colon cancer.    Plan: 1.  Metastatic CRPC to pelvic lymph nodes: - PET scan showed metastatic lymph nodes in the right iliac region with no bone lesions. - He is tolerating abiraterone and prednisone reasonably well. - Reviewed labs: Normal LFTs.   Potassium was normal.  CBC grossly normal.  PSA improved from 2.23-1.04. - His systolic blood pressures at home are staying between 130-140.  Diastolic blood pressure is normal. - Recommend continuing abiraterone 1000 mg daily and prednisone 5 mg daily. - RTC 8 weeks for follow-up with repeat PSA and LFTs.  2.  Normocytic anemia: - Combination anemia from CKD and iron deficiency. - Received Feraheme on 01/31/2023 on 02/12/2023. - Hemoglobin improved to 10.4.  Will check ferritin and iron panel in 8 weeks.  3.  Mild hypercalcemia: - He is taking vitamin D3 5000 units daily.  He is also taking calcium plus vitamin D.  Calcium level is 10.5 with albumin 3.7. - Recommend stopping calcium plus vitamin D and taking calcium only pills.    Orders Placed This Encounter  Procedures   CBC with Differential    Standing Status:   Future    Expected Date:   04/25/2023    Expiration Date:   02/29/2024   Comprehensive metabolic panel    Standing Status:   Future    Expected Date:   04/25/2023    Expiration Date:   02/29/2024   Iron and TIBC ([facility] ONLY)    Standing Status:   Future    Expected Date:   04/25/2023    Expiration Date:   02/29/2024   Ferritin    Standing Status:   Future    Expected Date:   04/25/2023    Expiration Date:   02/29/2024   PSA    Standing Status:   Future    Expected Date:  04/25/2023    Expiration Date:   02/29/2024   VITAMIN D 25 Hydroxy (Vit-D Deficiency, Fractures)    Standing Status:   Future    Expected Date:   04/25/2023    Expiration Date:   02/29/2024      Alben Deeds Teague,acting as a scribe for Doreatha Massed, MD.,have documented all relevant documentation on the behalf of Doreatha Massed, MD,as directed by  Doreatha Massed, MD while in the presence of Doreatha Massed, MD.  I, Doreatha Massed MD, have reviewed the above documentation for accuracy and completeness, and I agree with the above.     Doreatha Massed, MD   12/19/20244:47 PM  CHIEF COMPLAINT:   Diagnosis: metastatic prostate cancer to pelvic lymph nodes    Cancer Staging  No matching staging information was found for the patient.    Prior Therapy: 1. prostatectomy, 2003 2. Diana Eves, 11/2021 - 07/2022, discontinued due to seizures  Current Therapy:  ADT, Zytiga and prednisone   HISTORY OF PRESENT ILLNESS:   Oncology History   No history exists.     INTERVAL HISTORY:   Nathin is a 89 y.o. male presenting to clinic today for follow up of metastatic prostate cancer to pelvic lymph nodes. He was last seen by me on 01/23/23.  Today, he states that he is doing well overall. His appetite level is at 100%. His energy level is at 75%. He is accompanied by his wife. He states his energy levels improved after his last IV iron on 01/31/23 and 02/12/23. He is taking calcium as prescribed and vitamin D 5,000 units OD. He denies any side effects from [facility] or ADT. His systolic blood pressure at home has been in the 130-140's range.   PAST MEDICAL HISTORY:   Past Medical History: Past Medical History:  Diagnosis Date   COPD (chronic obstructive pulmonary disease) (HCC)    Emphysema    History of prostate cancer    Hypoxemia    Pulmonary hypertension (HCC)    PVC (premature ventricular contraction)     Surgical History: Past Surgical History:  Procedure Laterality Date   CYSTECTOMY     PROSTATECTOMY      Social History: Social History   Socioeconomic History   Marital status: Married    Spouse name: Not on file   Number of children: Not on file   Years of education: Not on file   Highest education level: Not on file  Occupational History   Occupation: retired    Associate Professor: RETIRED  Tobacco Use   Smoking status: Former    Current packs/day: 0.00    Average packs/day: 1 pack/day for 20.0 years (20.0 ttl pk-yrs)    Types: Cigarettes    Start date: 03/13/1962    Quit date: 03/13/1982    Years since quitting: 40.9    Smokeless tobacco: Never  Substance and Sexual Activity   Alcohol use: No   Drug use: No   Sexual activity: Not on file  Other Topics Concern   Not on file  Social History Narrative   Not on file   Social Drivers of Health   Financial Resource Strain: Low Risk  (08/14/2022)   Received from [facility], [facility]   Overall Financial Resource Strain (CARDIA)    Difficulty of Paying Living Expenses: Not hard at all  Food Insecurity: No Food Insecurity (08/14/2022)   Received from [facility], [facility]   Hunger Vital Sign    Worried About Running Out of Food in the  Last Year: Never true    Ran Out of Food in the Last Year: Never true  Transportation Needs: No Transportation Needs (08/14/2022)   Received from [facility], [facility]   PRAPARE - Transportation    Lack of Transportation (Medical): No    Lack of Transportation (Non-Medical): No  Physical Activity: Not on file  Stress: No Stress Concern Present (07/18/2022)   Received from [facility], [facility] - Occupational Stress Questionnaire    Feeling of Stress : Not at all  Social Connections: Unknown (07/17/2022)   Received from [facility], [facility]   Social Network    Social Network: Not on file  Intimate Partner Violence: Not At Risk (07/18/2022)   Received from [facility], [facility]   HITS    Over the last 12 months how often did your partner physically hurt you?: Never    Over the last 12 months how often did your partner insult you or talk down to you?: Never    Over the last 12 months how often did your partner threaten you with physical harm?: Never    Over the last 12 months how often did your partner scream or curse at you?: Never    Family History: Family History  Problem Relation Age of Onset   Sarcoidosis Brother    Heart disease Father    Rheum arthritis Sister    Rheum arthritis Sister    Rheum arthritis Father    Cancer Mother      Current Medications:  Current Outpatient Medications:    abiraterone acetate (ZYTIGA) 250 MG tablet, Take 4 tablets (1,000 mg total) by mouth daily. Take on an empty stomach 1 hour before or 2 hours after a meal, Disp: 120 tablet, Rfl: 2   albuterol (PROVENTIL) (2.5 MG/3ML) 0.083% nebulizer solution, Take 2.5 mg by nebulization every 4 (four) hours as needed., Disp: , Rfl:    apixaban (ELIQUIS) 5 MG TABS tablet, Take by mouth., Disp: , Rfl:    Calcium Carb-Cholecalciferol (OYSTER SHELL CALCIUM W/D) 500-5 MG-MCG TABS, Take 1 tablet by mouth 2 (two) times daily., Disp: , Rfl:    Cholecalciferol (VITAMIN D-3) 5000 UNITS TABS, Take 1 tablet by mouth daily., Disp: , Rfl:    famotidine (PEPCID) 20 MG tablet, Take 20 mg by mouth 2 (two) times daily., Disp: , Rfl:    Fluticasone-Salmeterol (ADVAIR) 250-50 MCG/DOSE AEPB, Inhale 1 puff into the lungs every 12 (twelve) hours., Disp: , Rfl:    glimepiride (AMARYL) 1 MG tablet, Take 1 mg by mouth daily before breakfast., Disp: , Rfl:    levETIRAcetam (KEPPRA) 250 MG tablet, Take by mouth., Disp: , Rfl:    LINZESS 72 MCG capsule, Take 72 mcg by mouth daily., Disp: , Rfl:    metoprolol tartrate (LOPRESSOR) 25 MG tablet, Take by mouth., Disp: , Rfl:    OXYGEN, Inhale 3.5 L into the lungs., Disp: , Rfl:    predniSONE (DELTASONE) 5 MG tablet, Take 1 tablet (5 mg total) by mouth daily with breakfast., Disp: 30 tablet, Rfl: 3   sodium chloride, hypertonic, 3 % solution, SMARTSIG:3 Milliliter(s) Via Nebulizer Twice Daily PRN, Disp: , Rfl:    telmisartan-hydrochlorothiazide (MICARDIS HCT) 80-25 MG tablet, Take 1 tablet by mouth daily., Disp: , Rfl:    tiotropium (SPIRIVA) 18 MCG inhalation capsule, Place 18 mcg into inhaler and inhale daily., Disp: , Rfl:   Current Facility-Administered Medications:    Leuprolide Acetate (3 Month) (ELIGARD)  22.5 MG injection 22.5 mg, 22.5 mg, Subcutaneous, Q90 days, McKenzie, Mardene Celeste, MD, 22.5 mg at 09/08/22 1209    Allergies: Allergies  Allergen Reactions   Neomycin-Bacitracin Zn-Polymyx     REACTION: rash    REVIEW OF SYSTEMS:   Review of Systems  Constitutional:  Negative for chills, fatigue and fever.  HENT:   Negative for lump/mass, mouth sores, nosebleeds, sore throat and trouble swallowing.   Eyes:  Negative for eye problems.  Respiratory:  Positive for cough and shortness of breath.   Cardiovascular:  Negative for chest pain, leg swelling and palpitations.  Gastrointestinal:  Positive for constipation. Negative for abdominal pain, diarrhea, nausea and vomiting.  Genitourinary:  Negative for bladder incontinence, difficulty urinating, dysuria, frequency, hematuria and nocturia.   Musculoskeletal:  Negative for arthralgias, back pain, flank pain, myalgias and neck pain.  Skin:  Negative for itching and rash.  Neurological:  Negative for dizziness, headaches and numbness.  Hematological:  Does not bruise/bleed easily.  Psychiatric/Behavioral:  Negative for depression, sleep disturbance and suicidal ideas. The patient is not nervous/anxious.   All other systems reviewed and are negative.    VITALS:   Blood pressure (!) 153/67, pulse 80, temperature 97.6 F (36.4 C), temperature source Tympanic, resp. rate 18, height 6' 1.5" (1.867 m), weight 219 lb 12.8 oz (99.7 kg), SpO2 99%.  Wt Readings from Last 3 Encounters:  03/01/23 219 lb 12.8 oz (99.7 kg)  01/03/23 226 lb (102.5 kg)  12/18/22 221 lb (100.2 kg)    Body mass index is 28.61 kg/m.  Performance status (ECOG): 1 - Symptomatic but completely ambulatory  PHYSICAL EXAM:   Physical Exam Vitals and nursing note reviewed. Exam conducted with a chaperone present.  Constitutional:      Appearance: Normal appearance.  Cardiovascular:     Rate and Rhythm: Normal rate and regular rhythm.     Pulses: Normal pulses.     Heart sounds: Normal heart sounds.  Pulmonary:     Effort: Pulmonary effort is normal.     Breath sounds:  Normal breath sounds.  Abdominal:     Palpations: Abdomen is soft. There is no hepatomegaly, splenomegaly or mass.     Tenderness: There is no abdominal tenderness.  Musculoskeletal:     Right lower leg: No edema.     Left lower leg: No edema.  Lymphadenopathy:     Cervical: No cervical adenopathy.     Right cervical: No superficial, deep or posterior cervical adenopathy.    Left cervical: No superficial, deep or posterior cervical adenopathy.     Upper Body:     Right upper body: No supraclavicular or axillary adenopathy.     Left upper body: No supraclavicular or axillary adenopathy.  Neurological:     General: No focal deficit present.     Mental Status: He is alert and oriented to person, place, and time.  Psychiatric:        Mood and Affect: Mood normal.        Behavior: Behavior normal.     LABS:      Latest Ref Rng & Units 02/27/2023   10:51 AM 12/18/2022    1:51 PM 10/23/2006    3:50 AM  CBC  WBC 4.0 - 10.5 K/uL 6.6  6.2  7.2   Hemoglobin 13.0 - 17.0 g/dL 30.8  9.3  9.7   Hematocrit 39.0 - 52.0 % 34.5  31.6  29.7   Platelets 150 - 400 K/uL 148  148  353  Latest Ref Rng & Units 02/27/2023   10:51 AM 01/22/2023   10:33 AM 12/18/2022    1:51 PM  CMP  Glucose 70 - 99 mg/dL 829  562  82   BUN 8 - 23 mg/dL 18  20  20    Creatinine 0.61 - 1.24 mg/dL 1.30  8.65  7.84   Sodium 135 - 145 mmol/L 136  138  135   Potassium 3.5 - 5.1 mmol/L 3.9  4.3  3.8   Chloride 98 - 111 mmol/L 93  95  93   CO2 22 - 32 mmol/L 38  37  35   Calcium 8.9 - 10.3 mg/dL 69.6  29.5  9.6   Total Protein 6.5 - 8.1 g/dL 6.7  6.4  6.8   Total Bilirubin <1.2 mg/dL 0.5  0.4  0.3   Alkaline Phos 38 - 126 U/L 45  51  42   AST 15 - 41 U/L 21  14  16    ALT 0 - 44 U/L 24  17  13       No results found for: "CEA1", "CEA" / No results found for: "CEA1", "CEA" Lab Results  Component Value Date   PSA1 1.6 11/21/2022   No results found for: "MWU132" No results found for: "CAN125"  No results  found for: "TOTALPROTELP", "ALBUMINELP", "A1GS", "A2GS", "BETS", "BETA2SER", "GAMS", "MSPIKE", "SPEI" Lab Results  Component Value Date   TIBC 331 01/22/2023   FERRITIN 28 01/22/2023   IRONPCTSAT 19 01/22/2023   No results found for: "LDH"   STUDIES:   No results found.

## 2023-03-01 ENCOUNTER — Inpatient Hospital Stay: Payer: Medicare Other | Admitting: Hematology

## 2023-03-01 VITALS — BP 153/67 | HR 80 | Temp 97.6°F | Resp 18 | Ht 73.5 in | Wt 219.8 lb

## 2023-03-01 DIAGNOSIS — D631 Anemia in chronic kidney disease: Secondary | ICD-10-CM | POA: Diagnosis not present

## 2023-03-01 DIAGNOSIS — D509 Iron deficiency anemia, unspecified: Secondary | ICD-10-CM | POA: Diagnosis not present

## 2023-03-01 DIAGNOSIS — N189 Chronic kidney disease, unspecified: Secondary | ICD-10-CM | POA: Diagnosis not present

## 2023-03-01 DIAGNOSIS — C61 Malignant neoplasm of prostate: Secondary | ICD-10-CM | POA: Diagnosis not present

## 2023-03-01 DIAGNOSIS — C775 Secondary and unspecified malignant neoplasm of intrapelvic lymph nodes: Secondary | ICD-10-CM | POA: Diagnosis not present

## 2023-03-01 NOTE — Progress Notes (Signed)
Patient is taking Zytiga as prescribed.  He has not missed any doses and reports no side effects at this time.   

## 2023-03-01 NOTE — Patient Instructions (Addendum)
[facility] at [facility] Discharge Instructions   You were seen and examined today by Dr. Ellin Saba.  He reviewed the results of your lab work which are mostly normal/stable. Your calcium is a little high. Cut out the calcium + Vitamin D pill. All other results are normal/stable.   We will see you back in 2 months. We will repeat your lab work prior to this visit.   Return as scheduled.    Thank you for choosing [facility] at [facility] to provide your oncology and hematology care.  To afford each patient quality time with our provider, please arrive at least 15 minutes before your scheduled appointment time.   If you have a lab appointment with the Cancer Center please come in thru the Main Entrance and check in at the main information desk.  You need to re-schedule your appointment should you arrive 10 or more minutes late.  We strive to give you quality time with our providers, and arriving late affects you and other patients whose appointments are after yours.  Also, if you no show three or more times for appointments you may be dismissed from the clinic at the providers discretion.     Again, thank you for choosing [facility].  Our hope is that these requests will decrease the amount of time that you wait before being seen by our physicians.       _____________________________________________________________  Should you have questions after your visit to [facility], please contact our office at [phone] and follow the prompts.  Our office hours are 8:00 a.m. and 4:30 p.m. Monday - Friday.  Please note that voicemails left after 4:00 p.m. may not be returned until the following business day.  We are closed weekends and major holidays.  You do have access to a nurse 24-7, just call the main number to the clinic [phone] and do not press any options, hold on the line and a nurse will answer the phone.     For prescription refill requests, have your pharmacy contact our office and allow 72 hours.    Due to Covid, you will need to wear a mask upon entering the hospital. If you do not have a mask, a mask will be given to you at the Main Entrance upon arrival. For doctor visits, patients may have 1 support person age 58 or older with them. For treatment visits, patients can not have anyone with them due to social distancing guidelines and our immunocompromised population.

## 2023-03-02 ENCOUNTER — Other Ambulatory Visit: Payer: Medicare Other

## 2023-03-05 ENCOUNTER — Other Ambulatory Visit: Payer: Medicare Other

## 2023-03-16 ENCOUNTER — Ambulatory Visit: Payer: Medicare Other | Admitting: Urology

## 2023-03-16 DIAGNOSIS — C61 Malignant neoplasm of prostate: Secondary | ICD-10-CM | POA: Diagnosis not present

## 2023-03-16 MED ORDER — LEUPROLIDE ACETATE (3 MONTH) 22.5 MG IM KIT
22.5000 mg | PACK | Freq: Once | INTRAMUSCULAR | Status: AC
  Administered 2023-03-16: 22.5 mg via INTRAMUSCULAR

## 2023-03-16 NOTE — Progress Notes (Signed)
 03/16/2023 12:47 PM   Danny Martinez 07/13/1933 [id number]  Referring provider: Orpha Yancey LABOR, MD [address] [place],  [place] [address]  No chief complaint on file.   HPI: Danny Martinez is a 89yo here for followup for metastatic prostate cancer. PSA decreased to 1.0 while on xytiga. No hot flashes. Stable fatigue. No significant LUTS.    PMH: Past Medical History:  Diagnosis Date   COPD (chronic obstructive pulmonary disease) (HCC)    Emphysema    History of prostate cancer    Hypoxemia    Pulmonary hypertension (HCC)    PVC (premature ventricular contraction)     Surgical History: Past Surgical History:  Procedure Laterality Date   CYSTECTOMY     PROSTATECTOMY      Home Medications:  Allergies as of 03/16/2023       Reactions   Neomycin-bacitracin Zn-polymyx    REACTION: rash        Medication List        Accurate as of March 16, 2023 12:47 PM. If you have any questions, ask your nurse or doctor.          abiraterone  acetate 250 MG tablet Commonly known as: ZYTIGA  Take 4 tablets (1,000 mg total) by mouth daily. Take on an empty stomach 1 hour before or 2 hours after a meal   albuterol  (2.5 MG/3ML) 0.083% nebulizer solution Commonly known as: PROVENTIL  Take 2.5 mg by nebulization every 4 (four) hours as needed.   apixaban 5 MG Tabs tablet Commonly known as: ELIQUIS Take by mouth.   famotidine 20 MG tablet Commonly known as: PEPCID Take 20 mg by mouth 2 (two) times daily.   Fluticasone-Salmeterol 250-50 MCG/DOSE Aepb Commonly known as: ADVAIR Inhale 1 puff into the lungs every 12 (twelve) hours.   glimepiride 1 MG tablet Commonly known as: AMARYL Take 1 mg by mouth daily before breakfast.   levETIRAcetam  250 MG tablet Commonly known as: KEPPRA  Take by mouth.   Linzess 72 MCG capsule Generic drug: linaclotide Take 72 mcg by mouth daily.   metoprolol tartrate 25 MG tablet Commonly known as: LOPRESSOR Take by mouth.    OXYGEN  Inhale 3.5 L into the lungs.   Oyster Shell Calcium w/D 500-5 MG-MCG Tabs Take 1 tablet by mouth 2 (two) times daily.   predniSONE  5 MG tablet Commonly known as: DELTASONE  Take 1 tablet (5 mg total) by mouth daily with breakfast.   sodium chloride  (hypertonic) 3 % solution SMARTSIG:3 Milliliter(s) Via Nebulizer Twice Daily PRN   telmisartan-hydrochlorothiazide 80-25 MG tablet Commonly known as: MICARDIS HCT Take 1 tablet by mouth daily.   tiotropium 18 MCG inhalation capsule Commonly known as: SPIRIVA Place 18 mcg into inhaler and inhale daily.   Vitamin D -3 125 MCG (5000 UT) Tabs Take 1 tablet by mouth daily.        Allergies:  Allergies  Allergen Reactions   Neomycin-Bacitracin Zn-Polymyx     REACTION: rash    Family History: Family History  Problem Relation Age of Onset   Sarcoidosis Brother    Heart disease Father    Rheum arthritis Sister    Rheum arthritis Sister    Rheum arthritis Father    Cancer Mother     Social History:  reports that he quit smoking about 41 years ago. His smoking use included cigarettes. He started smoking about 61 years ago. He has a 20 pack-year smoking history. He has never used smokeless tobacco. He reports that he does not drink  alcohol and does not use drugs.  ROS: All other review of systems were reviewed and are negative except what is noted above in HPI  Physical Exam: There were no vitals taken for this visit.  Constitutional:  Alert and oriented, No acute distress. HEENT: [place] AT, moist mucus membranes.  Trachea midline, no masses. Cardiovascular: No clubbing, cyanosis, or edema. Respiratory: Normal respiratory effort, no increased work of breathing. GI: Abdomen is soft, nontender, nondistended, no abdominal masses GU: No CVA tenderness.  Lymph: No cervical or inguinal lymphadenopathy. Skin: No rashes, bruises or suspicious lesions. Neurologic: Grossly intact, no focal deficits, moving all 4  extremities. Psychiatric: Normal mood and affect.  Laboratory Data: Lab Results  Component Value Date   WBC 6.6 02/27/2023   HGB 10.4 (L) 02/27/2023   HCT 34.5 (L) 02/27/2023   MCV 98.0 02/27/2023   PLT 148 (L) 02/27/2023    Lab Results  Component Value Date   CREATININE 0.97 02/27/2023    Lab Results  Component Value Date   PSA 0.3 09/30/2019   PSA 0.3 06/24/2019   PSA 0.4 03/26/2019    No results found for: TESTOSTERONE   No results found for: HGBA1C  Urinalysis    Component Value Date/Time   COLORURINE YELLOW 10/14/2006 1821   APPEARANCEUR Clear 04/15/2020 1358   LABSPEC 1.010 10/14/2006 1821   PHURINE 5.5 10/14/2006 1821   GLUCOSEU Negative 04/15/2020 1358   HGBUR TRACE (A) 10/14/2006 1821   BILIRUBINUR Negative 04/15/2020 1358   KETONESUR NEGATIVE 10/14/2006 1821   PROTEINUR Negative 04/15/2020 1358   PROTEINUR NEGATIVE 10/14/2006 1821   UROBILINOGEN 0.2 10/14/2006 1821   NITRITE Negative 04/15/2020 1358   NITRITE NEGATIVE 10/14/2006 1821   LEUKOCYTESUR Trace (A) 04/15/2020 1358    Lab Results  Component Value Date   LABMICR See below: 04/15/2020   WBCUA 0-5 04/15/2020   LABEPIT 0-10 04/15/2020   BACTERIA None seen 04/15/2020    Pertinent Imaging:  No results found for this or any previous visit.  No results found for this or any previous visit.  No results found for this or any previous visit.  No results found for this or any previous visit.  No results found for this or any previous visit.  No results found for this or any previous visit.  No results found for this or any previous visit.  No results found for this or any previous visit.   Assessment & Plan:    1. Prostate cancer (HCC) (Primary) Eligard  22.5mg  today Followup 3 months with PSA   No follow-ups on file.  Belvie Clara, MD  [facility] [place]

## 2023-03-16 NOTE — Patient Instructions (Signed)

## 2023-03-16 NOTE — Progress Notes (Signed)
 Lupron  IM Injection   Due to Prostate Cancer patient is present today for a Lupron  Injection.  Medication: Lupron  3 month Dose: 22.5 mg  Location: right upper outer buttocks Lot: [id number] Exp: 12/08/2024  Patient tolerated well, no complications were noted  Performed by: Veleria, CMA

## 2023-03-19 ENCOUNTER — Other Ambulatory Visit: Payer: Self-pay

## 2023-03-19 ENCOUNTER — Other Ambulatory Visit (HOSPITAL_COMMUNITY): Payer: Self-pay

## 2023-03-19 NOTE — Progress Notes (Signed)
 Patient called in stating November and December claims were billed incorrectly at cash pay price when insurance/grant should have covered. Successfully billed both to insurance/grant returning $0 copayment. December claim was refunded to credit card on file and statements will be mailed to patient. Will obtain management assistance in refunding November claims and reach back out to patient with outcome.

## 2023-03-20 ENCOUNTER — Other Ambulatory Visit: Payer: Self-pay

## 2023-03-21 ENCOUNTER — Other Ambulatory Visit: Payer: Self-pay

## 2023-03-21 NOTE — Progress Notes (Signed)
 Specialty Pharmacy Refill Coordination Note  Danny Martinez is a 89 y.o. male contacted today regarding refills of specialty medication(s) Abiraterone  Acetate (ZYTIGA )   Patient requested Delivery   Delivery date: 03/28/23   Verified address: [address] [place] [address]   Medication will be filled on 01.14.25.

## 2023-03-27 ENCOUNTER — Encounter: Payer: Self-pay | Admitting: Urology

## 2023-03-27 ENCOUNTER — Other Ambulatory Visit: Payer: Self-pay

## 2023-04-06 ENCOUNTER — Other Ambulatory Visit: Payer: Self-pay

## 2023-04-13 ENCOUNTER — Other Ambulatory Visit: Payer: Self-pay

## 2023-04-16 ENCOUNTER — Other Ambulatory Visit: Payer: Self-pay

## 2023-04-17 ENCOUNTER — Other Ambulatory Visit: Payer: Self-pay

## 2023-04-17 ENCOUNTER — Other Ambulatory Visit: Payer: Self-pay | Admitting: Hematology

## 2023-04-17 ENCOUNTER — Other Ambulatory Visit (HOSPITAL_COMMUNITY): Payer: Self-pay

## 2023-04-17 MED ORDER — ABIRATERONE ACETATE 250 MG PO TABS
1000.0000 mg | ORAL_TABLET | Freq: Every day | ORAL | 2 refills | Status: DC
  Filled 2023-04-17: qty 120, 30d supply, fill #0
  Filled 2023-05-24: qty 120, 30d supply, fill #1
  Filled 2023-06-27: qty 120, 30d supply, fill #2

## 2023-04-17 NOTE — Progress Notes (Signed)
 Specialty Pharmacy Ongoing Clinical Assessment Note  Danny Martinez is a 89 y.o. male who is being followed by the specialty pharmacy service for RxSp Oncology   Patient's specialty medication(s) reviewed today: Abiraterone  Acetate (ZYTIGA )   Missed doses in the last 4 weeks: 0   Patient/Caregiver did not have any additional questions or concerns.   Therapeutic benefit summary: Patient is achieving benefit   Adverse events/side effects summary: No adverse events/side effects   Patient's therapy is appropriate to: Continue    Goals Addressed             This Visit's Progress    Maintain optimal adherence to therapy   On track    Patient is initiating therapy. Patient will maintain adherence      Stabilization of disease   On track    Patient is on track. Patient will maintain adherence.          Follow up:  3 months  Braya Habermehl M Bronwen Pendergraft Specialty Pharmacist

## 2023-04-17 NOTE — Progress Notes (Signed)
 Specialty Pharmacy Refill Coordination Note  Danny Martinez is a 89 y.o. male contacted today regarding refills of specialty medication(s) Abiraterone  Acetate (ZYTIGA )   Patient requested Delivery   Delivery date: 04/24/23   Verified address: [address] [place] [address]   Medication will be filled on 04/23/23.

## 2023-04-18 ENCOUNTER — Other Ambulatory Visit: Payer: Self-pay

## 2023-04-18 ENCOUNTER — Other Ambulatory Visit: Payer: Self-pay | Admitting: Hematology

## 2023-04-23 ENCOUNTER — Other Ambulatory Visit: Payer: Self-pay

## 2023-05-01 ENCOUNTER — Inpatient Hospital Stay: Payer: Medicare Other | Attending: Hematology

## 2023-05-01 DIAGNOSIS — Z9981 Dependence on supplemental oxygen: Secondary | ICD-10-CM | POA: Diagnosis not present

## 2023-05-01 DIAGNOSIS — D509 Iron deficiency anemia, unspecified: Secondary | ICD-10-CM | POA: Insufficient documentation

## 2023-05-01 DIAGNOSIS — C7951 Secondary malignant neoplasm of bone: Secondary | ICD-10-CM | POA: Insufficient documentation

## 2023-05-01 DIAGNOSIS — Z87891 Personal history of nicotine dependence: Secondary | ICD-10-CM | POA: Insufficient documentation

## 2023-05-01 DIAGNOSIS — C775 Secondary and unspecified malignant neoplasm of intrapelvic lymph nodes: Secondary | ICD-10-CM | POA: Diagnosis not present

## 2023-05-01 DIAGNOSIS — C61 Malignant neoplasm of prostate: Secondary | ICD-10-CM | POA: Diagnosis not present

## 2023-05-01 DIAGNOSIS — N189 Chronic kidney disease, unspecified: Secondary | ICD-10-CM | POA: Diagnosis not present

## 2023-05-01 DIAGNOSIS — D631 Anemia in chronic kidney disease: Secondary | ICD-10-CM | POA: Insufficient documentation

## 2023-05-01 DIAGNOSIS — Z800 Family history of malignant neoplasm of digestive organs: Secondary | ICD-10-CM | POA: Diagnosis not present

## 2023-05-01 DIAGNOSIS — J449 Chronic obstructive pulmonary disease, unspecified: Secondary | ICD-10-CM | POA: Diagnosis not present

## 2023-05-01 DIAGNOSIS — Z7952 Long term (current) use of systemic steroids: Secondary | ICD-10-CM | POA: Insufficient documentation

## 2023-05-01 LAB — CBC WITH DIFFERENTIAL/PLATELET
Abs Immature Granulocytes: 0.03 10*3/uL (ref 0.00–0.07)
Basophils Absolute: 0 10*3/uL (ref 0.0–0.1)
Basophils Relative: 0 %
Eosinophils Absolute: 0.2 10*3/uL (ref 0.0–0.5)
Eosinophils Relative: 3 %
HCT: 33.8 % — ABNORMAL LOW (ref 39.0–52.0)
Hemoglobin: 10.3 g/dL — ABNORMAL LOW (ref 13.0–17.0)
Immature Granulocytes: 0 %
Lymphocytes Relative: 28 %
Lymphs Abs: 2.1 10*3/uL (ref 0.7–4.0)
MCH: 30.3 pg (ref 26.0–34.0)
MCHC: 30.5 g/dL (ref 30.0–36.0)
MCV: 99.4 fL (ref 80.0–100.0)
Monocytes Absolute: 0.9 10*3/uL (ref 0.1–1.0)
Monocytes Relative: 12 %
Neutro Abs: 4.4 10*3/uL (ref 1.7–7.7)
Neutrophils Relative %: 57 %
Platelets: 141 10*3/uL — ABNORMAL LOW (ref 150–400)
RBC: 3.4 MIL/uL — ABNORMAL LOW (ref 4.22–5.81)
RDW: 13.4 % (ref 11.5–15.5)
WBC: 7.6 10*3/uL (ref 4.0–10.5)
nRBC: 0 % (ref 0.0–0.2)

## 2023-05-01 LAB — COMPREHENSIVE METABOLIC PANEL
ALT: 20 U/L (ref 0–44)
AST: 19 U/L (ref 15–41)
Albumin: 3.7 g/dL (ref 3.5–5.0)
Alkaline Phosphatase: 50 U/L (ref 38–126)
Anion gap: 6 (ref 5–15)
BUN: 24 mg/dL — ABNORMAL HIGH (ref 8–23)
CO2: 38 mmol/L — ABNORMAL HIGH (ref 22–32)
Calcium: 10.7 mg/dL — ABNORMAL HIGH (ref 8.9–10.3)
Chloride: 95 mmol/L — ABNORMAL LOW (ref 98–111)
Creatinine, Ser: 1.1 mg/dL (ref 0.61–1.24)
GFR, Estimated: >60 mL/min (ref >60)
Glucose, Bld: 75 mg/dL (ref 70–99)
Potassium: 3.7 mmol/L (ref 3.5–5.1)
Sodium: 139 mmol/L (ref 135–145)
Total Bilirubin: 0.6 mg/dL (ref 0.0–1.2)
Total Protein: 7 g/dL (ref 6.5–8.1)

## 2023-05-01 LAB — IRON AND TIBC
Iron: 80 ug/dL (ref 45–182)
Saturation Ratios: 26 % (ref 17.9–39.5)
TIBC: 303 ug/dL (ref 250–450)
UIBC: 223 ug/dL

## 2023-05-01 LAB — VITAMIN D 25 HYDROXY (VIT D DEFICIENCY, FRACTURES): Vit D, 25-Hydroxy: 106 ng/mL — ABNORMAL HIGH (ref 30–100)

## 2023-05-01 LAB — PSA: Prostatic Specific Antigen: 0.84 ng/mL (ref 0.00–4.00)

## 2023-05-01 LAB — FERRITIN: Ferritin: 243 ng/mL (ref 24–336)

## 2023-05-08 ENCOUNTER — Inpatient Hospital Stay (HOSPITAL_BASED_OUTPATIENT_CLINIC_OR_DEPARTMENT_OTHER): Payer: Medicare Other | Admitting: Hematology

## 2023-05-08 VITALS — BP 153/73 | HR 71 | Temp 96.7°F | Resp 20 | Wt 216.9 lb

## 2023-05-08 DIAGNOSIS — Z7952 Long term (current) use of systemic steroids: Secondary | ICD-10-CM | POA: Diagnosis not present

## 2023-05-08 DIAGNOSIS — C61 Malignant neoplasm of prostate: Secondary | ICD-10-CM | POA: Diagnosis not present

## 2023-05-08 DIAGNOSIS — D509 Iron deficiency anemia, unspecified: Secondary | ICD-10-CM

## 2023-05-08 DIAGNOSIS — Z800 Family history of malignant neoplasm of digestive organs: Secondary | ICD-10-CM | POA: Diagnosis not present

## 2023-05-08 DIAGNOSIS — Z9981 Dependence on supplemental oxygen: Secondary | ICD-10-CM | POA: Diagnosis not present

## 2023-05-08 DIAGNOSIS — C7951 Secondary malignant neoplasm of bone: Secondary | ICD-10-CM | POA: Diagnosis not present

## 2023-05-08 DIAGNOSIS — N189 Chronic kidney disease, unspecified: Secondary | ICD-10-CM | POA: Diagnosis not present

## 2023-05-08 DIAGNOSIS — C775 Secondary and unspecified malignant neoplasm of intrapelvic lymph nodes: Secondary | ICD-10-CM | POA: Diagnosis not present

## 2023-05-08 DIAGNOSIS — Z87891 Personal history of nicotine dependence: Secondary | ICD-10-CM | POA: Diagnosis not present

## 2023-05-08 DIAGNOSIS — D631 Anemia in chronic kidney disease: Secondary | ICD-10-CM | POA: Diagnosis not present

## 2023-05-08 DIAGNOSIS — J449 Chronic obstructive pulmonary disease, unspecified: Secondary | ICD-10-CM | POA: Diagnosis not present

## 2023-05-08 NOTE — Progress Notes (Signed)
 Patient is taking Zytiga as prescribed. He has not missed any doses and reports no side effects at this time.

## 2023-05-08 NOTE — Progress Notes (Signed)
 [facility] [address] [place] [address]    Clinic Day:  05/08/2023  Referring physician: Toma Deiters, MD  Patient Care Team: Toma Deiters, MD as PCP - General (Internal Medicine) Doreatha Massed, MD as Medical Oncologist (Medical Oncology) Therese Sarah, RN as Oncology Nurse Navigator (Medical Oncology)   ASSESSMENT & PLAN:   Assessment: 1.  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  Had biochemical recurrence and was started on Lupron. - PSMA PET scan (12/01/2021): Multiple small intensely radiotracer avid prostate cancer metastasis to the pelvic lymph nodes involving right common iliac, right external iliac and right internal iliac nodes.  Most superior metastatic node at the L4 vertebral body level.  No visceral or skeletal metastatic disease. - Lupron every 3 months started on 03/26/2019 - Enzalutamide started on 12/07/2021 with improvement in PSA.  Discontinued after sustaining seizure on 07/17/2022.  PSA has been rising since then. - Germline mutation testing: Negative - PSMA PET (12/28/2022): Near identical pattern with the right internal iliac lymph node 10 mm SUV 30.7, right external iliac lymph node.  No new pelvic lymph nodes.  No visceral or bone metastatic disease. - Zytiga and prednisone started on 01/05/2023   2.  Social/family history: - Lives at home with his wife.  On oxygen secondary to COPD since 2008, 4 L/min.  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  Also worked in Recruitment consultant.  Quit smoking in 1985, smoked 1 pack/day for 30 years. - Mother died of colon cancer.    Plan: 1.  Metastatic CRPC to pelvic lymph nodes: - Previous PET scan showed metastatic lymph nodes in the right iliac region and no bone lesions. - He is tolerating abiraterone and prednisone fairly well. - I have reviewed blood pressure  log from home which showed blood pressure equal to or less than 130/80.  Today blood pressure is 150/70. - Labs from 05/01/2023: Normal LFTs.  Potassium was normal.  PSA improved to 0.84 from 1.04. - Continue abiraterone 1000 mg daily and prednisone 5 mg daily.  RTC 3 months for follow-up with repeat labs and PSA.  2.  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  Last Feraheme on 02/12/2023. - Ferritin is 243, percent saturation 26 and hemoglobin stable at 10.3.  3.  Mild hypercalcemia: - He is taking vitamin D3 5000 units daily and calcium 100 mg daily.  Calcium is elevated at 10.7. - Recommend stopping vitamin D supplements completely.  May continue calcium daily.    Orders Placed This Encounter  Procedures   CBC with Differential    Standing Status:   Future    Expected Date:   08/06/2023    Expiration Date:   05/07/2024   Comprehensive metabolic panel    Standing Status:   Future    Expected Date:   08/06/2023    Expiration Date:   05/07/2024   PSA    Standing Status:   Future    Expected Date:   08/06/2023    Expiration Date:   05/07/2024   Iron and TIBC ([facility] ONLY)    Standing Status:   Future    Expected Date:   08/06/2023    Expiration Date:   05/07/2024   Ferritin    Standing Status:   Future    Expected Date:   08/06/2023  Expiration Date:   05/07/2024   VITAMIN D 25 Hydroxy (Vit-D Deficiency, Fractures)    Standing Status:   Future    Expected Date:   08/06/2023    Expiration Date:   05/07/2024      Alben Deeds Teague,acting as a scribe for Doreatha Massed, MD.,have documented all relevant documentation on the behalf of Doreatha Massed, MD,as directed by  Doreatha Massed, MD while in the presence of Doreatha Massed, MD.  I, Doreatha Massed MD, have reviewed the above documentation for accuracy and completeness, and I agree with the above.      Doreatha Massed, MD   2/25/20252:28  PM  CHIEF COMPLAINT:   Diagnosis: metastatic prostate cancer to pelvic lymph nodes    Cancer Staging  No matching staging information was found for the patient.    Prior Therapy: 1. prostatectomy, 2003 2. Diana Eves, 11/2021 - 07/2022, discontinued due to seizures  Current Therapy:  ADT, Zytiga and prednisone   HISTORY OF PRESENT ILLNESS:   Oncology History   No history exists.     INTERVAL HISTORY:   Danny Martinez is a 89 y.o. male presenting to clinic today for follow up of metastatic prostate cancer to pelvic lymph nodes. He was last seen by me on 03/01/23.  Today, he states that he is doing well overall. His appetite level is at 100%. His energy level is at 70%. He is accompanied by his wife. Hicks denies any side effects from Proctor and is taking it as prescribed. He has not checked his blood pressure at home for 2 weeks. He has discontinued his calcium-Vitamin D pill. Zaccary instead takes calcium 500 mg OD and 1,000 units of Vitamin D daily.   PAST MEDICAL HISTORY:   Past Medical History: Past Medical History:  Diagnosis Date   COPD (chronic obstructive pulmonary disease) (HCC)    Emphysema    History of prostate cancer    Hypoxemia    Pulmonary hypertension (HCC)    PVC (premature ventricular contraction)     Surgical History: Past Surgical History:  Procedure Laterality Date   CYSTECTOMY     PROSTATECTOMY      Social History: Social History   Socioeconomic History   Marital status: Married    Spouse name: Not on file   Number of children: Not on file   Years of education: Not on file   Highest education level: Not on file  Occupational History   Occupation: retired    Associate Professor: RETIRED  Tobacco Use   Smoking status: Former    Current packs/day: 0.00    Average packs/day: 1 pack/day for 20.0 years (20.0 ttl pk-yrs)    Types: Cigarettes    Start date: 03/13/1962    Quit date: 03/13/1982    Years since quitting: 41.1   Smokeless tobacco: Never  Substance and  Sexual Activity   Alcohol use: No   Drug use: No   Sexual activity: Not on file  Other Topics Concern   Not on file  Social History Narrative   Not on file   Social Drivers of Health   Financial Resource Strain: Low Risk  (08/14/2022)   Received from [facility], [facility]   Overall Financial Resource Strain (CARDIA)    Difficulty of Paying Living Expenses: Not hard at all  Food Insecurity: No Food Insecurity (08/14/2022)   Received from [facility], [facility]   Hunger Vital Sign    Worried About Running Out of Food in the Last Year: Never true  Ran Out of Food in the Last Year: Never true  Transportation Needs: No Transportation Needs (08/14/2022)   Received from [facility], [facility]   PRAPARE - Transportation    Lack of Transportation (Medical): No    Lack of Transportation (Non-Medical): No  Physical Activity: Not on file  Stress: No Stress Concern Present (07/18/2022)   Received from [facility], [facility] [place] [facility] - Occupational Stress Questionnaire    Feeling of Stress : Not at all  Social Connections: Unknown (07/17/2022)   Received from [facility], [facility]   Social Network    Social Network: Not on file  Intimate Partner Violence: Not At Risk (07/18/2022)   Received from [facility], [facility]   HITS    Over the last 12 months how often did your partner physically hurt you?: Never    Over the last 12 months how often did your partner insult you or talk down to you?: Never    Over the last 12 months how often did your partner threaten you with physical harm?: Never    Over the last 12 months how often did your partner scream or curse at you?: Never    Family History: Family History  Problem Relation Age of Onset   Sarcoidosis Brother    Heart disease Father    Rheum arthritis Sister    Rheum arthritis Sister    Rheum arthritis Father    Cancer Mother     Current Medications:  Current  Outpatient Medications:    abiraterone acetate (ZYTIGA) 250 MG tablet, Take 4 tablets (1,000 mg total) by mouth daily. Take on an empty stomach 1 hour before or 2 hours after a meal, Disp: 120 tablet, Rfl: 2   albuterol (PROVENTIL) (2.5 MG/3ML) 0.083% nebulizer solution, Take 2.5 mg by nebulization every 4 (four) hours as needed., Disp: , Rfl:    apixaban (ELIQUIS) 5 MG TABS tablet, Take by mouth., Disp: , Rfl:    Calcium Carb-Cholecalciferol (OYSTER SHELL CALCIUM W/D) 500-5 MG-MCG TABS, Take 1 tablet by mouth 2 (two) times daily., Disp: , Rfl:    Cholecalciferol (VITAMIN D-3) 5000 UNITS TABS, Take 1 tablet by mouth daily., Disp: , Rfl:    famotidine (PEPCID) 20 MG tablet, Take 20 mg by mouth 2 (two) times daily., Disp: , Rfl:    Fluticasone-Salmeterol (ADVAIR) 250-50 MCG/DOSE AEPB, Inhale 1 puff into the lungs every 12 (twelve) hours., Disp: , Rfl:    glimepiride (AMARYL) 1 MG tablet, Take 1 mg by mouth daily before breakfast., Disp: , Rfl:    levETIRAcetam (KEPPRA) 250 MG tablet, Take by mouth., Disp: , Rfl:    LINZESS 72 MCG capsule, Take 72 mcg by mouth daily., Disp: , Rfl:    metoprolol tartrate (LOPRESSOR) 25 MG tablet, Take by mouth., Disp: , Rfl:    OXYGEN, Inhale 3.5 L into the lungs., Disp: , Rfl:    predniSONE (DELTASONE) 5 MG tablet, TAKE 1 TABLET BY MOUTH DAILY WITH BREAKFAST, Disp: 30 tablet, Rfl: 3   sodium chloride, hypertonic, 3 % solution, SMARTSIG:3 Milliliter(s) Via Nebulizer Twice Daily PRN, Disp: , Rfl:    telmisartan-hydrochlorothiazide (MICARDIS HCT) 80-25 MG tablet, Take 1 tablet by mouth daily., Disp: , Rfl:    tiotropium (SPIRIVA) 18 MCG inhalation capsule, Place 18 mcg into inhaler and inhale daily., Disp: , Rfl:   Current Facility-Administered Medications:    Leuprolide Acetate (3 Month) (ELIGARD) 22.5 MG injection 22.5 mg, 22.5 mg, Subcutaneous, Q90 days,  Malen Gauze, MD, 22.5 mg at 09/08/22 1209   Allergies: Allergies  Allergen Reactions    Neomycin-Bacitracin Zn-Polymyx     REACTION: rash    REVIEW OF SYSTEMS:   Review of Systems  Constitutional:  Negative for chills, fatigue and fever.  HENT:   Negative for lump/mass, mouth sores, nosebleeds, sore throat and trouble swallowing.   Eyes:  Negative for eye problems.  Respiratory:  Positive for shortness of breath. Negative for cough.   Cardiovascular:  Negative for chest pain, leg swelling and palpitations.  Gastrointestinal:  Positive for constipation. Negative for abdominal pain, diarrhea, nausea and vomiting.  Genitourinary:  Positive for frequency. Negative for bladder incontinence, difficulty urinating, dysuria, hematuria and nocturia.   Musculoskeletal:  Negative for arthralgias, back pain, flank pain, myalgias and neck pain.  Skin:  Negative for itching and rash.  Neurological:  Negative for dizziness, headaches and numbness.  Hematological:  Does not bruise/bleed easily.  Psychiatric/Behavioral:  Negative for depression, sleep disturbance and suicidal ideas. The patient is not nervous/anxious.   All other systems reviewed and are negative.    VITALS:   Blood pressure (!) 153/73, pulse 71, temperature (!) 96.7 F (35.9 C), temperature source Tympanic, resp. rate 20, weight 216 lb 14.9 oz (98.4 kg), SpO2 91%.  Wt Readings from Last 3 Encounters:  05/08/23 216 lb 14.9 oz (98.4 kg)  03/01/23 219 lb 12.8 oz (99.7 kg)  01/03/23 226 lb (102.5 kg)    Body mass index is 28.23 kg/m.  Performance status (ECOG): 1 - Symptomatic but completely ambulatory  PHYSICAL EXAM:   Physical Exam Vitals and nursing note reviewed. Exam conducted with a chaperone present.  Constitutional:      Appearance: Normal appearance.  Cardiovascular:     Rate and Rhythm: Normal rate and regular rhythm.     Pulses: Normal pulses.     Heart sounds: Normal heart sounds.  Pulmonary:     Effort: Pulmonary effort is normal.     Breath sounds: Normal breath sounds.  Abdominal:      Palpations: Abdomen is soft. There is no hepatomegaly, splenomegaly or mass.     Tenderness: There is no abdominal tenderness.  Musculoskeletal:     Right lower leg: No edema.     Left lower leg: No edema.  Lymphadenopathy:     Cervical: No cervical adenopathy.     Right cervical: No superficial, deep or posterior cervical adenopathy.    Left cervical: No superficial, deep or posterior cervical adenopathy.     Upper Body:     Right upper body: No supraclavicular or axillary adenopathy.     Left upper body: No supraclavicular or axillary adenopathy.  Neurological:     General: No focal deficit present.     Mental Status: He is alert and oriented to person, place, and time.  Psychiatric:        Mood and Affect: Mood normal.        Behavior: Behavior normal.     LABS:      Latest Ref Rng & Units 05/01/2023   10:45 AM 02/27/2023   10:51 AM 12/18/2022    1:51 PM  CBC  WBC 4.0 - 10.5 K/uL 7.6  6.6  6.2   Hemoglobin 13.0 - 17.0 g/dL 11.9  14.7  9.3   Hematocrit 39.0 - 52.0 % 33.8  34.5  31.6   Platelets 150 - 400 K/uL 141  148  148       Latest Ref Rng &  Units 05/01/2023   10:45 AM 02/27/2023   10:51 AM 01/22/2023   10:33 AM  CMP  Glucose 70 - 99 mg/dL 75  161  096   BUN 8 - 23 mg/dL 24  18  20    Creatinine 0.61 - 1.24 mg/dL 0.45  4.09  8.11   Sodium 135 - 145 mmol/L 139  136  138   Potassium 3.5 - 5.1 mmol/L 3.7  3.9  4.3   Chloride 98 - 111 mmol/L 95  93  95   CO2 22 - 32 mmol/L 38  38  37   Calcium 8.9 - 10.3 mg/dL 91.4  78.2  95.6   Total Protein 6.5 - 8.1 g/dL 7.0  6.7  6.4   Total Bilirubin 0.0 - 1.2 mg/dL 0.6  0.5  0.4   Alkaline Phos 38 - 126 U/L 50  45  51   AST 15 - 41 U/L 19  21  14    ALT 0 - 44 U/L 20  24  17       No results found for: "CEA1", "CEA" / No results found for: "CEA1", "CEA" Lab Results  Component Value Date   PSA1 1.6 11/21/2022   No results found for: "OZH086" No results found for: "CAN125"  No results found for: "TOTALPROTELP",  "ALBUMINELP", "A1GS", "A2GS", "BETS", "BETA2SER", "GAMS", "MSPIKE", "SPEI" Lab Results  Component Value Date   TIBC 303 05/01/2023   TIBC 331 01/22/2023   FERRITIN 243 05/01/2023   FERRITIN 28 01/22/2023   IRONPCTSAT 26 05/01/2023   IRONPCTSAT 19 01/22/2023   No results found for: "LDH"   STUDIES:   No results found.

## 2023-05-08 NOTE — Patient Instructions (Signed)
 [facility] at [facility] Discharge Instructions   You were seen and examined today by Dr. Ellin Saba.  He reviewed the results of your lab work which are mostly normal/stable. Your calcium is slightly high and your Vitamin D is high. Dr. Kirtland Bouchard would like for you to stop taking vitamin D supplements altogether.   Continue Zytiga as prescribed.   We will see you back in 3 months. We will repeat lab work prior to this visit.   Return as scheduled.    Thank you for choosing [facility] at [facility] to provide your oncology and hematology care.  To afford each patient quality time with our provider, please arrive at least 15 minutes before your scheduled appointment time.   If you have a lab appointment with the Cancer Center please come in thru the Main Entrance and check in at the main information desk.  You need to re-schedule your appointment should you arrive 10 or more minutes late.  We strive to give you quality time with our providers, and arriving late affects you and other patients whose appointments are after yours.  Also, if you no show three or more times for appointments you may be dismissed from the clinic at the providers discretion.     Again, thank you for choosing [facility].  Our hope is that these requests will decrease the amount of time that you wait before being seen by our physicians.       _____________________________________________________________  Should you have questions after your visit to [facility], please contact our office at [phone] and follow the prompts.  Our office hours are 8:00 a.m. and 4:30 p.m. Monday - Friday.  Please note that voicemails left after 4:00 p.m. may not be returned until the following business day.  We are closed weekends and major holidays.  You do have access to a nurse 24-7, just call the main number to the clinic [phone] and do not press any options,  hold on the line and a nurse will answer the phone.    For prescription refill requests, have your pharmacy contact our office and allow 72 hours.    Due to Covid, you will need to wear a mask upon entering the hospital. If you do not have a mask, a mask will be given to you at the Main Entrance upon arrival. For doctor visits, patients may have 1 support person age 69 or older with them. For treatment visits, patients can not have anyone with them due to social distancing guidelines and our immunocompromised population.

## 2023-05-11 ENCOUNTER — Other Ambulatory Visit: Payer: Self-pay

## 2023-05-23 DIAGNOSIS — E1122 Type 2 diabetes mellitus with diabetic chronic kidney disease: Secondary | ICD-10-CM | POA: Diagnosis not present

## 2023-05-23 DIAGNOSIS — I10 Essential (primary) hypertension: Secondary | ICD-10-CM | POA: Diagnosis not present

## 2023-05-23 DIAGNOSIS — J9612 Chronic respiratory failure with hypercapnia: Secondary | ICD-10-CM | POA: Diagnosis not present

## 2023-05-23 DIAGNOSIS — E7849 Other hyperlipidemia: Secondary | ICD-10-CM | POA: Diagnosis not present

## 2023-05-24 ENCOUNTER — Other Ambulatory Visit: Payer: Self-pay

## 2023-05-24 NOTE — Progress Notes (Signed)
 Specialty Pharmacy Refill Coordination Note  Danny Martinez is a 89 y.o. male contacted today regarding refills of specialty medication(s) Abiraterone Acetate [name]   Patient requested Delivery   Delivery date: 05/31/23   Verified address: [address] [place] [address]   Medication will be filled on 05/30/23.

## 2023-05-28 DIAGNOSIS — J9612 Chronic respiratory failure with hypercapnia: Secondary | ICD-10-CM | POA: Diagnosis not present

## 2023-05-28 DIAGNOSIS — E7849 Other hyperlipidemia: Secondary | ICD-10-CM | POA: Diagnosis not present

## 2023-05-28 DIAGNOSIS — E1122 Type 2 diabetes mellitus with diabetic chronic kidney disease: Secondary | ICD-10-CM | POA: Diagnosis not present

## 2023-05-28 DIAGNOSIS — I10 Essential (primary) hypertension: Secondary | ICD-10-CM | POA: Diagnosis not present

## 2023-05-30 ENCOUNTER — Other Ambulatory Visit: Payer: Self-pay

## 2023-06-13 ENCOUNTER — Encounter: Payer: Self-pay | Admitting: Urology

## 2023-06-13 ENCOUNTER — Ambulatory Visit: Payer: Medicare Other | Admitting: Urology

## 2023-06-13 VITALS — BP 123/70 | HR 72

## 2023-06-13 DIAGNOSIS — C775 Secondary and unspecified malignant neoplasm of intrapelvic lymph nodes: Secondary | ICD-10-CM | POA: Diagnosis not present

## 2023-06-13 DIAGNOSIS — C61 Malignant neoplasm of prostate: Secondary | ICD-10-CM | POA: Diagnosis not present

## 2023-06-13 MED ORDER — LEUPROLIDE ACETATE (3 MONTH) 22.5 MG IM KIT
22.5000 mg | PACK | INTRAMUSCULAR | Status: AC
Start: 2023-06-13 — End: 2024-03-09
  Administered 2023-06-13 – 2023-09-12 (×2): 22.5 mg via INTRAMUSCULAR

## 2023-06-13 NOTE — Patient Instructions (Signed)

## 2023-06-13 NOTE — Progress Notes (Signed)
 06/13/2023 11:45 AM   Danny Martinez 1933-05-27 [id number]  Referring provider: Toma Deiters, MD [address] [place],  [place] [address]  Followup prostate cancer   HPI: Danny Martinez is a 89yo here for followup for metastatic prostate cancer. PSa decreased to 0.8 on xytiga. He has urinary frequency every 4-5 hours and nocturia 1-2x. Mild hot flashes. He is on linzess for constipation   PMH: Past Medical History:  Diagnosis Date   COPD (chronic obstructive pulmonary disease) (HCC)    Emphysema    History of prostate cancer    Hypoxemia    Pulmonary hypertension (HCC)    PVC (premature ventricular contraction)     Surgical History: Past Surgical History:  Procedure Laterality Date   CYSTECTOMY     PROSTATECTOMY      Home Medications:  Allergies as of 06/13/2023       Reactions   Neomycin-bacitracin Zn-polymyx    REACTION: rash        Medication List        Accurate as of June 13, 2023 11:45 AM. If you have any questions, ask your nurse or doctor.          abiraterone acetate 250 MG tablet Commonly known as: ZYTIGA Take 4 tablets (1,000 mg total) by mouth daily. Take on an empty stomach 1 hour before or 2 hours after a meal   albuterol (2.5 MG/3ML) 0.083% nebulizer solution Commonly known as: PROVENTIL Take 2.5 mg by nebulization every 4 (four) hours as needed.   apixaban 5 MG Tabs tablet Commonly known as: ELIQUIS Take by mouth.   famotidine 20 MG tablet Commonly known as: PEPCID Take 20 mg by mouth 2 (two) times daily.   Fluticasone-Salmeterol 250-50 MCG/DOSE Aepb Commonly known as: ADVAIR Inhale 1 puff into the lungs every 12 (twelve) hours.   glimepiride 1 MG tablet Commonly known as: AMARYL Take 1 mg by mouth daily before breakfast.   levETIRAcetam 250 MG tablet Commonly known as: KEPPRA Take by mouth.   Linzess 72 MCG capsule Generic drug: linaclotide Take 72 mcg by mouth daily.   metoprolol tartrate 25 MG tablet Commonly  known as: LOPRESSOR Take by mouth.   OXYGEN Inhale 3.5 L into the lungs.   Oyster Shell Calcium w/D 500-5 MG-MCG Tabs Take 1 tablet by mouth 2 (two) times daily.   predniSONE 5 MG tablet Commonly known as: DELTASONE TAKE 1 TABLET BY MOUTH DAILY WITH BREAKFAST   sodium chloride (hypertonic) 3 % solution SMARTSIG:3 Milliliter(s) Via Nebulizer Twice Daily PRN   telmisartan-hydrochlorothiazide 80-25 MG tablet Commonly known as: MICARDIS HCT Take 1 tablet by mouth daily.   tiotropium 18 MCG inhalation capsule Commonly known as: SPIRIVA Place 18 mcg into inhaler and inhale daily.   Vitamin D-3 125 MCG (5000 UT) Tabs Take 1 tablet by mouth daily.        Allergies:  Allergies  Allergen Reactions   Neomycin-Bacitracin Zn-Polymyx     REACTION: rash    Family History: Family History  Problem Relation Age of Onset   Sarcoidosis Brother    Heart disease Father    Rheum arthritis Sister    Rheum arthritis Sister    Rheum arthritis Father    Cancer Mother     Social History:  reports that he quit smoking about 41 years ago. His smoking use included cigarettes. He started smoking about 61 years ago. He has a 20 pack-year smoking history. He has never used smokeless tobacco. He reports that  he does not drink alcohol and does not use drugs.  ROS: All other review of systems were reviewed and are negative except what is noted above in HPI  Physical Exam: BP 123/70   Pulse 72   Constitutional:  Alert and oriented, No acute distress. HEENT: [place] AT, moist mucus membranes.  Trachea midline, no masses. Cardiovascular: No clubbing, cyanosis, or edema. Respiratory: Normal respiratory effort, no increased work of breathing. GI: Abdomen is soft, nontender, nondistended, no abdominal masses GU: No CVA tenderness.  Lymph: No cervical or inguinal lymphadenopathy. Skin: No rashes, bruises or suspicious lesions. Neurologic: Grossly intact, no focal deficits, moving all 4  extremities. Psychiatric: Normal mood and affect.  Laboratory Data: Lab Results  Component Value Date   WBC 7.6 05/01/2023   HGB 10.3 (L) 05/01/2023   HCT 33.8 (L) 05/01/2023   MCV 99.4 05/01/2023   PLT 141 (L) 05/01/2023    Lab Results  Component Value Date   CREATININE 1.10 05/01/2023    Lab Results  Component Value Date   PSA 0.3 09/30/2019   PSA 0.3 06/24/2019   PSA 0.4 03/26/2019    No results found for: "TESTOSTERONE"  No results found for: "HGBA1C"  Urinalysis    Component Value Date/Time   COLORURINE YELLOW 10/14/2006 1821   APPEARANCEUR Clear 04/15/2020 1358   LABSPEC 1.010 10/14/2006 1821   PHURINE 5.5 10/14/2006 1821   GLUCOSEU Negative 04/15/2020 1358   HGBUR TRACE (A) 10/14/2006 1821   BILIRUBINUR Negative 04/15/2020 1358   KETONESUR NEGATIVE 10/14/2006 1821   PROTEINUR Negative 04/15/2020 1358   PROTEINUR NEGATIVE 10/14/2006 1821   UROBILINOGEN 0.2 10/14/2006 1821   NITRITE Negative 04/15/2020 1358   NITRITE NEGATIVE 10/14/2006 1821   LEUKOCYTESUR Trace (A) 04/15/2020 1358    Lab Results  Component Value Date   LABMICR See below: 04/15/2020   WBCUA 0-5 04/15/2020   LABEPIT 0-10 04/15/2020   BACTERIA None seen 04/15/2020    Pertinent Imaging:  No results found for this or any previous visit.  No results found for this or any previous visit.  No results found for this or any previous visit.  No results found for this or any previous visit.  No results found for this or any previous visit.  No results found for this or any previous visit.  No results found for this or any previous visit.  No results found for this or any previous visit.   Assessment & Plan:    1. Prostate cancer metastatic to intrapelvic lymph node (HCC) (Primary) Eligard 22.5mg  today -Followup 3 months with a PSA -continue xytiga   No follow-ups on file.  Wilkie Aye, MD  [facility] [place]

## 2023-06-19 DIAGNOSIS — H35033 Hypertensive retinopathy, bilateral: Secondary | ICD-10-CM | POA: Diagnosis not present

## 2023-06-21 ENCOUNTER — Other Ambulatory Visit: Payer: Self-pay

## 2023-06-27 ENCOUNTER — Other Ambulatory Visit: Payer: Self-pay

## 2023-06-27 NOTE — Progress Notes (Signed)
 Specialty Pharmacy Refill Coordination Note  Danny Martinez is a 89 y.o. male contacted today regarding refills of specialty medication(s) Abiraterone Acetate (ZYTIGA)   Patient requested Delivery   Delivery date: 06/29/23   Verified address: [address] [place] [address]   Medication will be filled on 04.17.25.

## 2023-06-28 ENCOUNTER — Other Ambulatory Visit: Payer: Self-pay

## 2023-07-18 ENCOUNTER — Other Ambulatory Visit: Payer: Self-pay | Admitting: Hematology

## 2023-07-19 ENCOUNTER — Other Ambulatory Visit (HOSPITAL_COMMUNITY): Payer: Self-pay

## 2023-07-20 ENCOUNTER — Other Ambulatory Visit: Payer: Self-pay | Admitting: Hematology

## 2023-07-20 ENCOUNTER — Other Ambulatory Visit: Payer: Self-pay

## 2023-07-20 NOTE — Progress Notes (Signed)
 Specialty Pharmacy Refill Coordination Note  Danny Martinez is a 89 y.o. male contacted today regarding refills of specialty medication(s) Abiraterone  Acetate (ZYTIGA )   Patient requested Delivery   Delivery date: 07/30/23   Verified address: [address] [place] [address]   Medication will be filled on 07/27/23.

## 2023-07-20 NOTE — Progress Notes (Signed)
 Specialty Pharmacy Ongoing Clinical Assessment Note  Danny Martinez is a 89 y.o. male who is being followed by the specialty pharmacy service for RxSp Oncology   Patient's specialty medication(s) reviewed today: Abiraterone  Acetate (ZYTIGA )   Missed doses in the last 4 weeks: 0   Patient/Caregiver did not have any additional questions or concerns.   Therapeutic benefit summary: Patient is achieving benefit   Adverse events/side effects summary: No adverse events/side effects   Patient's therapy is appropriate to: Continue    Goals Addressed             This Visit's Progress    Maintain optimal adherence to therapy   On track    Patient is on track. Patient will maintain adherence         Follow up: 3 months  Shields Pautz M Nabila Albarracin Specialty Pharmacist

## 2023-07-23 ENCOUNTER — Other Ambulatory Visit (HOSPITAL_COMMUNITY): Payer: Self-pay

## 2023-07-23 ENCOUNTER — Other Ambulatory Visit: Payer: Self-pay

## 2023-07-23 ENCOUNTER — Other Ambulatory Visit: Payer: Self-pay | Admitting: *Deleted

## 2023-07-23 MED ORDER — ABIRATERONE ACETATE 250 MG PO TABS
1000.0000 mg | ORAL_TABLET | Freq: Every day | ORAL | 2 refills | Status: DC
  Filled 2023-07-23 (×2): qty 120, 30d supply, fill #0
  Filled 2023-08-22: qty 120, 30d supply, fill #1
  Filled 2023-09-20: qty 120, 30d supply, fill #2

## 2023-07-23 NOTE — Telephone Encounter (Signed)
 Zytiga  refill approvd.  Patient is tolerating and is to continue therapy per Dr. Katragadda.

## 2023-07-24 ENCOUNTER — Inpatient Hospital Stay: Payer: Medicare Other

## 2023-07-26 ENCOUNTER — Inpatient Hospital Stay: Attending: Hematology

## 2023-07-26 DIAGNOSIS — C7951 Secondary malignant neoplasm of bone: Secondary | ICD-10-CM | POA: Insufficient documentation

## 2023-07-26 DIAGNOSIS — Z800 Family history of malignant neoplasm of digestive organs: Secondary | ICD-10-CM | POA: Insufficient documentation

## 2023-07-26 DIAGNOSIS — Z7952 Long term (current) use of systemic steroids: Secondary | ICD-10-CM | POA: Diagnosis not present

## 2023-07-26 DIAGNOSIS — D509 Iron deficiency anemia, unspecified: Secondary | ICD-10-CM | POA: Diagnosis not present

## 2023-07-26 DIAGNOSIS — N182 Chronic kidney disease, stage 2 (mild): Secondary | ICD-10-CM | POA: Insufficient documentation

## 2023-07-26 DIAGNOSIS — C61 Malignant neoplasm of prostate: Secondary | ICD-10-CM | POA: Insufficient documentation

## 2023-07-26 DIAGNOSIS — Z87891 Personal history of nicotine dependence: Secondary | ICD-10-CM | POA: Insufficient documentation

## 2023-07-26 DIAGNOSIS — D631 Anemia in chronic kidney disease: Secondary | ICD-10-CM | POA: Insufficient documentation

## 2023-07-26 LAB — CBC WITH DIFFERENTIAL/PLATELET
Abs Immature Granulocytes: 0.03 10*3/uL (ref 0.00–0.07)
Basophils Absolute: 0 10*3/uL (ref 0.0–0.1)
Basophils Relative: 0 %
Eosinophils Absolute: 0.1 10*3/uL (ref 0.0–0.5)
Eosinophils Relative: 2 %
HCT: 33.9 % — ABNORMAL LOW (ref 39.0–52.0)
Hemoglobin: 10.1 g/dL — ABNORMAL LOW (ref 13.0–17.0)
Immature Granulocytes: 0 %
Lymphocytes Relative: 19 %
Lymphs Abs: 1.4 10*3/uL (ref 0.7–4.0)
MCH: 30 pg (ref 26.0–34.0)
MCHC: 29.8 g/dL — ABNORMAL LOW (ref 30.0–36.0)
MCV: 100.6 fL — ABNORMAL HIGH (ref 80.0–100.0)
Monocytes Absolute: 0.8 10*3/uL (ref 0.1–1.0)
Monocytes Relative: 10 %
Neutro Abs: 5.1 10*3/uL (ref 1.7–7.7)
Neutrophils Relative %: 69 %
Platelets: 166 10*3/uL (ref 150–400)
RBC: 3.37 MIL/uL — ABNORMAL LOW (ref 4.22–5.81)
RDW: 12.6 % (ref 11.5–15.5)
WBC: 7.5 10*3/uL (ref 4.0–10.5)
nRBC: 0 % (ref 0.0–0.2)

## 2023-07-26 LAB — COMPREHENSIVE METABOLIC PANEL WITH GFR
ALT: 17 U/L (ref 0–44)
AST: 15 U/L (ref 15–41)
Albumin: 3.6 g/dL (ref 3.5–5.0)
Alkaline Phosphatase: 49 U/L (ref 38–126)
Anion gap: 8 (ref 5–15)
BUN: 22 mg/dL (ref 8–23)
CO2: 37 mmol/L — ABNORMAL HIGH (ref 22–32)
Calcium: 10.1 mg/dL (ref 8.9–10.3)
Chloride: 94 mmol/L — ABNORMAL LOW (ref 98–111)
Creatinine, Ser: 1.26 mg/dL — ABNORMAL HIGH (ref 0.61–1.24)
GFR, Estimated: 55 mL/min — ABNORMAL LOW (ref >60)
Glucose, Bld: 95 mg/dL (ref 70–99)
Potassium: 4.2 mmol/L (ref 3.5–5.1)
Sodium: 139 mmol/L (ref 135–145)
Total Bilirubin: 0.6 mg/dL (ref 0.0–1.2)
Total Protein: 6.6 g/dL (ref 6.5–8.1)

## 2023-07-26 LAB — IRON AND TIBC
Iron: 77 ug/dL (ref 45–182)
Saturation Ratios: 25 % (ref 17.9–39.5)
TIBC: 313 ug/dL (ref 250–450)
UIBC: 236 ug/dL

## 2023-07-26 LAB — PSA: Prostatic Specific Antigen: 0.55 ng/mL (ref 0.00–4.00)

## 2023-07-26 LAB — FERRITIN: Ferritin: 166 ng/mL (ref 24–336)

## 2023-07-27 LAB — VITAMIN D 25 HYDROXY (VIT D DEFICIENCY, FRACTURES): Vit D, 25-Hydroxy: 65.32 ng/mL (ref 30–100)

## 2023-07-31 ENCOUNTER — Inpatient Hospital Stay: Payer: Medicare Other | Admitting: Hematology

## 2023-07-31 VITALS — BP 147/66 | HR 67 | Temp 98.1°F | Resp 19 | Wt 218.2 lb

## 2023-07-31 DIAGNOSIS — Z87891 Personal history of nicotine dependence: Secondary | ICD-10-CM | POA: Diagnosis not present

## 2023-07-31 DIAGNOSIS — C7951 Secondary malignant neoplasm of bone: Secondary | ICD-10-CM | POA: Diagnosis not present

## 2023-07-31 DIAGNOSIS — N182 Chronic kidney disease, stage 2 (mild): Secondary | ICD-10-CM | POA: Diagnosis not present

## 2023-07-31 DIAGNOSIS — C61 Malignant neoplasm of prostate: Secondary | ICD-10-CM

## 2023-07-31 DIAGNOSIS — D631 Anemia in chronic kidney disease: Secondary | ICD-10-CM | POA: Diagnosis not present

## 2023-07-31 DIAGNOSIS — Z7952 Long term (current) use of systemic steroids: Secondary | ICD-10-CM | POA: Diagnosis not present

## 2023-07-31 DIAGNOSIS — D509 Iron deficiency anemia, unspecified: Secondary | ICD-10-CM | POA: Diagnosis not present

## 2023-07-31 DIAGNOSIS — Z800 Family history of malignant neoplasm of digestive organs: Secondary | ICD-10-CM | POA: Diagnosis not present

## 2023-07-31 NOTE — Patient Instructions (Addendum)
 [facility] at [facility] Discharge Instructions   You were seen and examined today by Dr. Cheree Cords.  He reviewed the results of your lab work which are normal/stable. Your iron has dropped some but is still normal. Your PSA was 0.5.   Dr. Linnell Richardson recommends you receive another iron infusion to prevent further dropping until your next visit.   Continue Zytiga  and prednisone  as prescribed.   We will see you back in 3 months. We will repeat lab work prior to this visit.    Return as scheduled.    Thank you for choosing  Cancer Center at [facility] to provide your oncology and hematology care.  To afford each patient quality time with our provider, please arrive at least 15 minutes before your scheduled appointment time.   If you have a lab appointment with the Cancer Center please come in thru the Main Entrance and check in at the main information desk.  You need to re-schedule your appointment should you arrive 10 or more minutes late.  We strive to give you quality time with our providers, and arriving late affects you and other patients whose appointments are after yours.  Also, if you no show three or more times for appointments you may be dismissed from the clinic at the providers discretion.     Again, thank you for choosing [facility].  Our hope is that these requests will decrease the amount of time that you wait before being seen by our physicians.       _____________________________________________________________  Should you have questions after your visit to [facility], please contact our office at [phone] and follow the prompts.  Our office hours are 8:00 a.m. and 4:30 p.m. Monday - Friday.  Please note that voicemails left after 4:00 p.m. may not be returned until the following business day.  We are closed weekends and major holidays.  You do have access to a nurse 24-7, just call the main number to the  clinic [phone] and do not press any options, hold on the line and a nurse will answer the phone.    For prescription refill requests, have your pharmacy contact our office and allow 72 hours.    Due to Covid, you will need to wear a mask upon entering the hospital. If you do not have a mask, a mask will be given to you at the Main Entrance upon arrival. For doctor visits, patients may have 1 support person age 63 or older with them. For treatment visits, patients can not have anyone with them due to social distancing guidelines and our immunocompromised population.

## 2023-07-31 NOTE — Progress Notes (Signed)
 Patient is taking Zytiga as prescribed. He has not missed any doses and reports no side effects at this time.

## 2023-07-31 NOTE — Progress Notes (Signed)
 [facility] [address] [place] [address]    Clinic Day:  07/31/2023  Referring physician: Veda Gerald, MD  Patient Care Team: Danny Gerald, MD as PCP - General (Internal Medicine) Danny Boros, MD as Medical Oncologist (Medical Oncology) Danny Knuckles, RN as Oncology Nurse Navigator (Medical Oncology)   ASSESSMENT & PLAN:   Assessment: 1.  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  Had biochemical recurrence and was started on Lupron . - PSMA PET scan (12/01/2021): Multiple small intensely radiotracer avid prostate cancer metastasis to the pelvic lymph nodes involving right common iliac, right external iliac and right internal iliac nodes.  Most superior metastatic node at the L4 vertebral body level.  No visceral or skeletal metastatic disease. - Lupron  every 3 months started on 03/26/2019 - Enzalutamide  started on 12/07/2021 with improvement in PSA.  Discontinued after sustaining seizure on 07/17/2022.  PSA has been rising since then. - Germline mutation testing: Negative - PSMA PET (12/28/2022): Near identical pattern with the right internal iliac lymph node 10 mm SUV 30.7, right external iliac lymph node.  No new pelvic lymph nodes.  No visceral or bone metastatic disease. - Zytiga  and prednisone  started on 01/05/2023   2.  Social/family history: - Lives at home with his wife.  On oxygen  secondary to COPD since 2008, 4 L/min.  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  Also worked in Recruitment consultant.  Quit smoking in 1985, smoked 1 pack/day for 30 years. - Danny Martinez died of colon cancer.    Plan: 1.  Metastatic CRPC to pelvic lymph nodes: - Previous PET scan showed metastatic lymph nodes in the right iliac region with no bone lesions. - He is tolerating abiraterone  and prednisone  very well. - He reports blood pressure is  staying in the normal range at home.  It is mildly elevated at 150/60 in the office today. - Reviewed labs from 07/26/2023: Mild CKD stable at 1.26.  LFTs are normal.  CBC grossly normal.  PSA has improved to 0.55 from 0.84 on 05/01/2023 and about 2 prior to start of Zytiga .  Continue Zytiga  1000 mg daily and prednisone  5 mg daily until progression.  RTC 3 months for follow-up.  2.  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  Last Danny Martinez was in December 2024. - Latest ferritin is down to 166 and saturation 25.  Hemoglobin is 10.1.  Recommend Feraheme 500 mg x 1 dose.  3.  Mild hypercalcemia: - He was taking vitamin D3 5000 units daily which was stopped on 05/08/2023 as his level was high at 106.  Repeat vitamin D  on 07/26/2023 is 65.    Orders Placed This Encounter  Procedures   CBC with Differential    Standing Status:   Future    Expected Date:   10/22/2023    Expiration Date:   07/30/2024   Comprehensive metabolic panel    Standing Status:   Future    Expected Date:   10/22/2023    Expiration Date:   07/30/2024   Iron and TIBC ([facility] ONLY)    Standing Status:   Future    Expected Date:   10/22/2023    Expiration Date:   07/30/2024   Ferritin    Standing Status:   Future    Expected Date:   10/22/2023  Expiration Date:   07/30/2024   PSA    Standing Status:   Future    Expected Date:   10/22/2023    Expiration Date:   07/30/2024      Danny Martinez,acting as a scribe for Danny Boros, MD.,have documented all relevant documentation on the behalf of Danny Boros, MD,as directed by  Danny Boros, MD while in the presence of Danny Boros, MD.  I, Danny Boros MD, have reviewed the above documentation for accuracy and completeness, and I agree with the above.       Danny Boros, MD   5/20/202512:42 PM  CHIEF COMPLAINT:   Diagnosis: metastatic prostate cancer to pelvic lymph  nodes    Cancer Staging  No matching staging information was found for the patient.    Prior Therapy: 1. prostatectomy, 2003 2. Xtandi , 11/2021 - 07/2022, discontinued due to seizures  Current Therapy:  ADT, Zytiga  and prednisone    HISTORY OF PRESENT ILLNESS:   Oncology History   No history exists.     INTERVAL HISTORY:   [name] is a 89 y.o. male presenting to clinic today for follow up of metastatic prostate cancer to pelvic lymph nodes. He was last seen by me on 05/08/23.  Today, he states that he is doing well overall. His appetite level is at 100%. His energy level is at 60%. He is accompanied by his wife.   PAST MEDICAL HISTORY:   Past Medical History: Past Medical History:  Diagnosis Date   COPD (chronic obstructive pulmonary disease) (HCC)    Emphysema    History of prostate cancer    Hypoxemia    Pulmonary hypertension (HCC)    PVC (premature ventricular contraction)     Surgical History: Past Surgical History:  Procedure Laterality Date   CYSTECTOMY     PROSTATECTOMY      Social History: Social History   Socioeconomic History   Marital status: Married    Spouse name: Not on file   Number of children: Not on file   Years of education: Not on file   Highest education level: Not on file  Occupational History   Occupation: retired    Associate Professor: RETIRED  Tobacco Use   Smoking status: Former    Current packs/day: 0.00    Average packs/day: 1 pack/day for 20.0 years (20.0 ttl pk-yrs)    Types: Cigarettes    Start date: 03/13/1962    Quit date: 03/13/1982    Years since quitting: 41.4   Smokeless tobacco: Never  Substance and Sexual Activity   Alcohol use: No   Drug use: No   Sexual activity: Not on file  Other Topics Concern   Not on file  Social History Narrative   Not on file   Social Drivers of Health   Financial Resource Strain: Low Risk  (08/14/2022)   Received from [facility], [facility]   Overall Financial Resource Strain (CARDIA)     Difficulty of Paying Living Expenses: Not hard at all  Food Insecurity: No Food Insecurity (08/14/2022)   Received from [facility], [facility]   Hunger Vital Sign    Worried About Running Out of Food in the Last Year: Never true    Ran Out of Food in the Last Year: Never true  Transportation Needs: No Transportation Needs (08/14/2022)   Received from [facility], [facility]   PRAPARE - Transportation    Lack of Transportation (Medical): No    Lack of Transportation (Non-Medical): No  Physical Activity:  Not on file  Stress: No Stress Concern Present (07/18/2022)   Received from [facility], [facility] - Occupational Stress Questionnaire    Feeling of Stress : Not at all  Social Connections: Unknown (07/17/2022)   Received from [facility], [facility]   Social Network    Social Network: Not on file  Intimate Partner Violence: Not At Risk (07/18/2022)   Received from [facility], [facility]   HITS    Over the last 12 months how often did your partner physically hurt you?: Never    Over the last 12 months how often did your partner insult you or talk down to you?: Never    Over the last 12 months how often did your partner threaten you with physical harm?: Never    Over the last 12 months how often did your partner scream or curse at you?: Never    Family History: Family History  Problem Relation Age of Onset   Sarcoidosis Brother    Heart disease Father    Rheum arthritis Sister    Rheum arthritis Sister    Rheum arthritis Father    Cancer Danny Martinez     Current Medications:  Current Outpatient Medications:    abiraterone  acetate (ZYTIGA ) 250 MG tablet, Take 4 tablets (1,000 mg total) by mouth daily. Take on an empty stomach 1 hour before or 2 hours after a meal, Disp: 120 tablet, Rfl: 2   albuterol  (PROVENTIL ) (2.5 MG/3ML) 0.083% nebulizer solution, Take 2.5 mg by nebulization every 4 (four) hours as needed., Disp: ,  Rfl:    apixaban (ELIQUIS) 5 MG TABS tablet, Take by mouth., Disp: , Rfl:    cyanocobalamin  (VITAMIN B12) 1000 MCG tablet, Take 1,000 mcg by mouth daily., Disp: , Rfl:    famotidine (PEPCID) 20 MG tablet, Take 20 mg by mouth 2 (two) times daily., Disp: , Rfl:    Fluticasone-Salmeterol (ADVAIR) 250-50 MCG/DOSE AEPB, Inhale 1 puff into the lungs every 12 (twelve) hours., Disp: , Rfl:    glimepiride (AMARYL) 1 MG tablet, Take 1 mg by mouth daily before breakfast., Disp: , Rfl:    LINZESS 72 MCG capsule, Take 72 mcg by mouth daily., Disp: , Rfl:    metoprolol tartrate (LOPRESSOR) 25 MG tablet, Take by mouth., Disp: , Rfl:    OXYGEN , Inhale 3.5 L into the lungs., Disp: , Rfl:    Oyster Shell Calcium 500 MG TABS, Take 1 tablet by mouth 2 (two) times daily., Disp: , Rfl:    predniSONE  (DELTASONE ) 5 MG tablet, TAKE 1 TABLET BY MOUTH DAILY WITH BREAKFAST, Disp: 30 tablet, Rfl: 3   sodium chloride , hypertonic, 3 % solution, SMARTSIG:3 Milliliter(s) Via Nebulizer Twice Daily PRN, Disp: , Rfl:    telmisartan-hydrochlorothiazide (MICARDIS HCT) 80-25 MG tablet, Take 1 tablet by mouth daily., Disp: , Rfl:    tiotropium (SPIRIVA) 18 MCG inhalation capsule, Place 18 mcg into inhaler and inhale daily., Disp: , Rfl:   Current Facility-Administered Medications:    leuprolide  (LUPRON ) injection 22.5 mg, 22.5 mg, Intramuscular, Q90 days, McKenzie, Arden Beck, MD, 22.5 mg at 06/13/23 1156   Leuprolide  Acetate (3 Month) (ELIGARD ) 22.5 MG injection 22.5 mg, 22.5 mg, Subcutaneous, Q90 days, McKenzie, Arden Beck, MD, 22.5 mg at 09/08/22 1209   Allergies: Allergies  Allergen Reactions   Neomycin-Bacitracin Zn-Polymyx     REACTION: rash    REVIEW OF SYSTEMS:   Review of Systems  Constitutional:  Negative for chills, fatigue and  fever.  HENT:   Negative for lump/mass, mouth sores, nosebleeds, sore throat and trouble swallowing.   Eyes:  Negative for eye problems.  Respiratory:  Positive for cough and shortness of  breath.   Cardiovascular:  Negative for chest pain, leg swelling and palpitations.  Gastrointestinal:  Positive for constipation. Negative for abdominal pain, diarrhea, nausea and vomiting.  Genitourinary:  Negative for bladder incontinence, difficulty urinating, dysuria, frequency, hematuria and nocturia.   Musculoskeletal:  Negative for arthralgias, back pain, flank pain, myalgias and neck pain.  Skin:  Negative for itching and rash.  Neurological:  Negative for dizziness, headaches and numbness.  Hematological:  Does not bruise/bleed easily.  Psychiatric/Behavioral:  Negative for depression, sleep disturbance and suicidal ideas. The patient is not nervous/anxious.   All other systems reviewed and are negative.    VITALS:   Blood pressure (!) 147/66, pulse 67, temperature 98.1 F (36.7 C), temperature source Oral, resp. rate 19, weight 218 lb 3.2 oz (99 kg), SpO2 100%.  Wt Readings from Last 3 Encounters:  07/31/23 218 lb 3.2 oz (99 kg)  05/08/23 216 lb 14.9 oz (98.4 kg)  03/01/23 219 lb 12.8 oz (99.7 kg)    Body mass index is 28.4 kg/m.  Performance status (ECOG): 1 - Symptomatic but completely ambulatory  PHYSICAL EXAM:   Physical Exam Vitals and nursing note reviewed. Exam conducted with a chaperone present.  Constitutional:      Appearance: Normal appearance.  Cardiovascular:     Rate and Rhythm: Normal rate and regular rhythm.     Pulses: Normal pulses.     Heart sounds: Normal heart sounds.  Pulmonary:     Effort: Pulmonary effort is normal.     Breath sounds: Normal breath sounds.  Abdominal:     Palpations: Abdomen is soft. There is no hepatomegaly, splenomegaly or mass.     Tenderness: There is no abdominal tenderness.  Musculoskeletal:     Right lower leg: No edema.     Left lower leg: No edema.  Lymphadenopathy:     Cervical: No cervical adenopathy.     Right cervical: No superficial, deep or posterior cervical adenopathy.    Left cervical: No superficial,  deep or posterior cervical adenopathy.     Upper Body:     Right upper body: No supraclavicular or axillary adenopathy.     Left upper body: No supraclavicular or axillary adenopathy.  Neurological:     General: No focal deficit present.     Mental Status: He is alert and oriented to person, place, and time.  Psychiatric:        Mood and Affect: Mood normal.        Behavior: Behavior normal.     LABS:      Latest Ref Rng & Units 07/26/2023    3:46 PM 05/01/2023   10:45 AM 02/27/2023   10:51 AM  CBC  WBC 4.0 - 10.5 K/uL 7.5  7.6  6.6   Hemoglobin 13.0 - 17.0 g/dL 65.7  84.6  96.2   Hematocrit 39.0 - 52.0 % 33.9  33.8  34.5   Platelets 150 - 400 K/uL 166  141  148       Latest Ref Rng & Units 07/26/2023    3:46 PM 05/01/2023   10:45 AM 02/27/2023   10:51 AM  CMP  Glucose 70 - 99 mg/dL 95  75  952   BUN 8 - 23 mg/dL 22  24  18    Creatinine 0.61 -  1.24 mg/dL 1.91  4.78  2.95   Sodium 135 - 145 mmol/L 139  139  136   Potassium 3.5 - 5.1 mmol/L 4.2  3.7  3.9   Chloride 98 - 111 mmol/L 94  95  93   CO2 22 - 32 mmol/L 37  38  38   Calcium 8.9 - 10.3 mg/dL 62.1  30.8  65.7   Total Protein 6.5 - 8.1 g/dL 6.6  7.0  6.7   Total Bilirubin 0.0 - 1.2 mg/dL 0.6  0.6  0.5   Alkaline Phos 38 - 126 U/L 49  50  45   AST 15 - 41 U/L 15  19  21    ALT 0 - 44 U/L 17  20  24       No results found for: "CEA1", "CEA" / No results found for: "CEA1", "CEA" Lab Results  Component Value Date   PSA1 1.6 11/21/2022   No results found for: "QIO962" No results found for: "CAN125"  No results found for: "TOTALPROTELP", "ALBUMINELP", "A1GS", "A2GS", "BETS", "BETA2SER", "GAMS", "MSPIKE", "SPEI" Lab Results  Component Value Date   TIBC 313 07/26/2023   TIBC 303 05/01/2023   TIBC 331 01/22/2023   FERRITIN 166 07/26/2023   FERRITIN 243 05/01/2023   FERRITIN 28 01/22/2023   IRONPCTSAT 25 07/26/2023   IRONPCTSAT 26 05/01/2023   IRONPCTSAT 19 01/22/2023   No results found for: "LDH"   STUDIES:    No results found.

## 2023-08-03 DIAGNOSIS — I5032 Chronic diastolic (congestive) heart failure: Secondary | ICD-10-CM | POA: Diagnosis not present

## 2023-08-03 DIAGNOSIS — J9611 Chronic respiratory failure with hypoxia: Secondary | ICD-10-CM | POA: Diagnosis not present

## 2023-08-03 DIAGNOSIS — E785 Hyperlipidemia, unspecified: Secondary | ICD-10-CM | POA: Diagnosis not present

## 2023-08-03 DIAGNOSIS — I10 Essential (primary) hypertension: Secondary | ICD-10-CM | POA: Diagnosis not present

## 2023-08-03 DIAGNOSIS — Z1330 Encounter for screening examination for mental health and behavioral disorders, unspecified: Secondary | ICD-10-CM | POA: Diagnosis not present

## 2023-08-08 ENCOUNTER — Other Ambulatory Visit

## 2023-08-09 ENCOUNTER — Inpatient Hospital Stay

## 2023-08-09 VITALS — BP 149/61 | HR 57 | Temp 98.2°F | Resp 19

## 2023-08-09 DIAGNOSIS — Z7952 Long term (current) use of systemic steroids: Secondary | ICD-10-CM | POA: Diagnosis not present

## 2023-08-09 DIAGNOSIS — N182 Chronic kidney disease, stage 2 (mild): Secondary | ICD-10-CM | POA: Diagnosis not present

## 2023-08-09 DIAGNOSIS — Z87891 Personal history of nicotine dependence: Secondary | ICD-10-CM | POA: Diagnosis not present

## 2023-08-09 DIAGNOSIS — D509 Iron deficiency anemia, unspecified: Secondary | ICD-10-CM

## 2023-08-09 DIAGNOSIS — C61 Malignant neoplasm of prostate: Secondary | ICD-10-CM | POA: Diagnosis not present

## 2023-08-09 DIAGNOSIS — C7951 Secondary malignant neoplasm of bone: Secondary | ICD-10-CM | POA: Diagnosis not present

## 2023-08-09 DIAGNOSIS — D631 Anemia in chronic kidney disease: Secondary | ICD-10-CM | POA: Diagnosis not present

## 2023-08-09 DIAGNOSIS — Z800 Family history of malignant neoplasm of digestive organs: Secondary | ICD-10-CM | POA: Diagnosis not present

## 2023-08-09 MED ORDER — SODIUM CHLORIDE 0.9 % IV SOLN: INTRAVENOUS | Status: DC

## 2023-08-09 MED ORDER — SODIUM CHLORIDE 0.9 % IV SOLN
510.0000 mg | Freq: Once | INTRAVENOUS | Status: AC
  Administered 2023-08-09: 510 mg via INTRAVENOUS
  Filled 2023-08-09: qty 510

## 2023-08-09 NOTE — Progress Notes (Signed)
 Patient presents today for iron infusion.  Patient is in satisfactory condition with no new complaints voiced.  Vital signs are stable.  IV placed in R arm.  IV flushed well with good blood return noted.  We will proceed with infusion per provider orders.    Patient tolerated infusion well with no complaints voiced.  Patient left via wheelchair with wife in stable condition.  Vital signs stable at discharge.  Follow up as scheduled.

## 2023-08-09 NOTE — Patient Instructions (Signed)
 [facility] [place] - A DEPT OF [facility]  Discharge Instructions: Thank you for choosing [facility] to provide your oncology and hematology care.  If you have a lab appointment with the Cancer Center - please note that after April 8th, 2024, all labs will be drawn in the cancer center.  You do not have to check in or register with the main entrance as you have in the past but will complete your check-in in the cancer center.  Wear comfortable clothing and clothing appropriate for easy access to any Portacath or PICC line.   We strive to give you quality time with your provider. You may need to reschedule your appointment if you arrive late (15 or more minutes).  Arriving late affects you and other patients whose appointments are after yours.  Also, if you miss three or more appointments without notifying the office, you may be dismissed from the clinic at the provider's discretion.      For prescription refill requests, have your pharmacy contact our office and allow 72 hours for refills to be completed.    Today you received the following:  Feraheme.  Ferumoxytol Injection What is this medication? FERUMOXYTOL (FER ue MOX i tol) treats low levels of iron in your body (iron deficiency anemia). Iron is a mineral that plays an important role in making red blood cells, which carry oxygen from your lungs to the rest of your body. This medicine may be used for other purposes; ask your health care provider or pharmacist if you have questions. COMMON BRAND NAME(S): Feraheme What should I tell my care team before I take this medication? They need to know if you have any of these conditions: Anemia not caused by low iron levels High levels of iron in the blood Magnetic resonance imaging (MRI) test scheduled An unusual or allergic reaction to iron, other medications, foods, dyes, or preservatives Pregnant or trying to get pregnant Breastfeeding How should I  use this medication? This medication is injected into a vein. It is given by your care team in a hospital or clinic setting. Talk to your care team the use of this medication in children. Special care may be needed. Overdosage: If you think you have taken too much of this medicine contact a poison control center or emergency room at once. NOTE: This medicine is only for you. Do not share this medicine with others. What if I miss a dose? It is important not to miss your dose. Call your care team if you are unable to keep an appointment. What may interact with this medication? Other iron products This list may not describe all possible interactions. Give your health care provider a list of all the medicines, herbs, non-prescription drugs, or dietary supplements you use. Also tell them if you smoke, drink alcohol, or use illegal drugs. Some items may interact with your medicine. What should I watch for while using this medication? Visit your care team for regular checks on your progress. Tell your care team if your symptoms do not start to get better or if they get worse. You may need blood work done while you are taking this medication. You may need to eat more foods that contain iron. Talk to your care team. Foods that contain iron include whole grains or cereals, dried fruits, beans, peas, leafy green vegetables, and organ meats (liver, kidney). What side effects may I notice from receiving this medication? Side effects that you should  report to your care team as soon as possible: Allergic reactions--skin rash, itching, hives, swelling of the face, lips, tongue, or throat Low blood pressure--dizziness, feeling faint or lightheaded, blurry vision Shortness of breath Side effects that usually do not require medical attention (report to your care team if they continue or are bothersome): Flushing Headache Joint pain Muscle pain Nausea Pain, redness, or irritation at injection site This list  may not describe all possible side effects. Call your doctor for medical advice about side effects. You may report side effects to FDA at 1-800-FDA-1088. Where should I keep my medication? This medication is given in a hospital or clinic. It will not be stored at home. NOTE: This sheet is a summary. It may not cover all possible information. If you have questions about this medicine, talk to your doctor, pharmacist, or health care provider.  2024 Elsevier/Gold Standard (2022-10-18 00:00:00)    To help prevent nausea and vomiting after your treatment, we encourage you to take your nausea medication as directed.  BELOW ARE SYMPTOMS THAT SHOULD BE REPORTED IMMEDIATELY: *FEVER GREATER THAN 100.4 F (38 C) OR HIGHER *CHILLS OR SWEATING *NAUSEA AND VOMITING THAT IS NOT CONTROLLED WITH YOUR NAUSEA MEDICATION *UNUSUAL SHORTNESS OF BREATH *UNUSUAL BRUISING OR BLEEDING *URINARY PROBLEMS (pain or burning when urinating, or frequent urination) *BOWEL PROBLEMS (unusual diarrhea, constipation, pain near the anus) TENDERNESS IN MOUTH AND THROAT WITH OR WITHOUT PRESENCE OF ULCERS (sore throat, sores in mouth, or a toothache) UNUSUAL RASH, SWELLING OR PAIN  UNUSUAL VAGINAL DISCHARGE OR ITCHING   Items with * indicate a potential emergency and should be followed up as soon as possible or go to the Emergency Department if any problems should occur.  Please show the CHEMOTHERAPY ALERT CARD or IMMUNOTHERAPY ALERT CARD at check-in to the Emergency Department and triage nurse.  Should you have questions after your visit or need to cancel or reschedule your appointment, please contact [facility] [place] - A DEPT OF [facility] [phone]  and follow the prompts.  Office hours are 8:00 a.m. to 4:30 p.m. Monday - Friday. Please note that voicemails left after 4:00 p.m. may not be returned until the following business day.  We are closed weekends and major holidays. You have access to a  nurse at all times for urgent questions. Please call the main number to the clinic [phone] and follow the prompts.  For any non-urgent questions, you may also contact your provider using MyChart. We now offer e-Visits for anyone 39 and older to request care online for non-urgent symptoms. For details visit [email or web address].   Also download the MyChart app! Go to the app store, search "MyChart", open the app, select [place], and log in with your MyChart username and password.

## 2023-08-15 ENCOUNTER — Ambulatory Visit: Admitting: Urology

## 2023-08-22 ENCOUNTER — Other Ambulatory Visit: Payer: Self-pay

## 2023-08-22 DIAGNOSIS — I10 Essential (primary) hypertension: Secondary | ICD-10-CM | POA: Diagnosis not present

## 2023-08-22 DIAGNOSIS — Z0000 Encounter for general adult medical examination without abnormal findings: Secondary | ICD-10-CM | POA: Diagnosis not present

## 2023-08-22 DIAGNOSIS — J449 Chronic obstructive pulmonary disease, unspecified: Secondary | ICD-10-CM | POA: Diagnosis not present

## 2023-08-22 DIAGNOSIS — E1122 Type 2 diabetes mellitus with diabetic chronic kidney disease: Secondary | ICD-10-CM | POA: Diagnosis not present

## 2023-08-22 DIAGNOSIS — E7849 Other hyperlipidemia: Secondary | ICD-10-CM | POA: Diagnosis not present

## 2023-08-23 ENCOUNTER — Other Ambulatory Visit: Payer: Self-pay

## 2023-08-23 ENCOUNTER — Other Ambulatory Visit: Payer: Self-pay | Admitting: Pharmacy Technician

## 2023-08-23 NOTE — Progress Notes (Signed)
 Specialty Pharmacy Refill Coordination Note  Danny Martinez is a 89 y.o. male contacted today regarding refills of specialty medication(s) Abiraterone  Acetate (ZYTIGA )   Patient requested Delivery   Delivery date: 08/28/23   Verified address: [address]   [place] [address]   Medication will be filled on 08/27/23.

## 2023-08-29 DIAGNOSIS — R0902 Hypoxemia: Secondary | ICD-10-CM | POA: Diagnosis not present

## 2023-08-29 DIAGNOSIS — J449 Chronic obstructive pulmonary disease, unspecified: Secondary | ICD-10-CM | POA: Diagnosis not present

## 2023-09-03 ENCOUNTER — Other Ambulatory Visit

## 2023-09-07 ENCOUNTER — Other Ambulatory Visit

## 2023-09-07 DIAGNOSIS — C775 Secondary and unspecified malignant neoplasm of intrapelvic lymph nodes: Secondary | ICD-10-CM | POA: Diagnosis not present

## 2023-09-07 DIAGNOSIS — C61 Malignant neoplasm of prostate: Secondary | ICD-10-CM | POA: Diagnosis not present

## 2023-09-08 LAB — PSA: Prostate Specific Ag, Serum: 0.6 ng/mL (ref 0.0–4.0)

## 2023-09-11 ENCOUNTER — Ambulatory Visit: Payer: Self-pay | Admitting: Urology

## 2023-09-12 ENCOUNTER — Encounter: Payer: Self-pay | Admitting: Urology

## 2023-09-12 ENCOUNTER — Ambulatory Visit: Admitting: Urology

## 2023-09-12 VITALS — BP 161/66 | HR 101

## 2023-09-12 DIAGNOSIS — C61 Malignant neoplasm of prostate: Secondary | ICD-10-CM

## 2023-09-12 DIAGNOSIS — C775 Secondary and unspecified malignant neoplasm of intrapelvic lymph nodes: Secondary | ICD-10-CM

## 2023-09-12 NOTE — Patient Instructions (Signed)

## 2023-09-12 NOTE — Progress Notes (Signed)
 09/12/2023 10:44 AM   [place] JONETTA Lunger 1933-11-20 [id number]  Referring provider: Orpha Yancey LABOR, MD [address] [place],  [place] [address]  Prostate cancer   HPI: Mr Maysonet is a 89yo here for followup for metastatic prostate cancer. PSA decreased to 0.6 from 1.6. He is on xytiga and eligard . He has mild hot flashes. No worsening LUTS. No worsening incontinence.    PMH: Past Medical History:  Diagnosis Date   COPD (chronic obstructive pulmonary disease) (HCC)    Emphysema    History of prostate cancer    Hypoxemia    Pulmonary hypertension (HCC)    PVC (premature ventricular contraction)     Surgical History: Past Surgical History:  Procedure Laterality Date   CYSTECTOMY     PROSTATECTOMY      Home Medications:  Allergies as of 09/12/2023       Reactions   Neomycin-bacitracin Zn-polymyx    REACTION: rash        Medication List        Accurate as of September 12, 2023 10:44 AM. If you have any questions, ask your nurse or doctor.          abiraterone  acetate 250 MG tablet Commonly known as: ZYTIGA  Take 4 tablets (1,000 mg total) by mouth daily. Take on an empty stomach 1 hour before or 2 hours after a meal   albuterol  (2.5 MG/3ML) 0.083% nebulizer solution Commonly known as: PROVENTIL  Take 2.5 mg by nebulization every 4 (four) hours as needed.   apixaban 5 MG Tabs tablet Commonly known as: ELIQUIS Take by mouth.   cyanocobalamin  1000 MCG tablet Commonly known as: VITAMIN B12 Take 1,000 mcg by mouth daily.   famotidine 20 MG tablet Commonly known as: PEPCID Take 20 mg by mouth 2 (two) times daily.   Fluticasone-Salmeterol 250-50 MCG/DOSE Aepb Commonly known as: ADVAIR Inhale 1 puff into the lungs every 12 (twelve) hours.   glimepiride 1 MG tablet Commonly known as: AMARYL Take 1 mg by mouth daily before breakfast.   Linzess 72 MCG capsule Generic drug: linaclotide Take 72 mcg by mouth daily.   metoprolol tartrate 25 MG tablet Commonly  known as: LOPRESSOR Take by mouth.   OXYGEN  Inhale 3.5 L into the lungs.   Oyster Shell Calcium 500 MG Tabs Take 1 tablet by mouth 2 (two) times daily.   predniSONE  5 MG tablet Commonly known as: DELTASONE  TAKE 1 TABLET BY MOUTH DAILY WITH BREAKFAST   sodium chloride  (hypertonic) 3 % solution SMARTSIG:3 Milliliter(s) Via Nebulizer Twice Daily PRN   telmisartan-hydrochlorothiazide 80-25 MG tablet Commonly known as: MICARDIS HCT Take 1 tablet by mouth daily.   tiotropium 18 MCG inhalation capsule Commonly known as: SPIRIVA Place 18 mcg into inhaler and inhale daily.        Allergies:  Allergies  Allergen Reactions   Neomycin-Bacitracin Zn-Polymyx     REACTION: rash    Family History: Family History  Problem Relation Age of Onset   Sarcoidosis Brother    Heart disease Father    Rheum arthritis Sister    Rheum arthritis Sister    Rheum arthritis Father    Cancer Mother     Social History:  reports that he quit smoking about 41 years ago. His smoking use included cigarettes. He started smoking about 61 years ago. He has a 20 pack-year smoking history. He has never used smokeless tobacco. He reports that he does not drink alcohol and does not use drugs.  ROS: All other  review of systems were reviewed and are negative except what is noted above in HPI  Physical Exam: BP (!) 183/63   Pulse (!) 101   Constitutional:  Alert and oriented, No acute distress. HEENT: [place] AT, moist mucus membranes.  Trachea midline, no masses. Cardiovascular: No clubbing, cyanosis, or edema. Respiratory: Normal respiratory effort, no increased work of breathing. GI: Abdomen is soft, nontender, nondistended, no abdominal masses GU: No CVA tenderness.  Lymph: No cervical or inguinal lymphadenopathy. Skin: No rashes, bruises or suspicious lesions. Neurologic: Grossly intact, no focal deficits, moving all 4 extremities. Psychiatric: Normal mood and affect.  Laboratory Data: Lab Results   Component Value Date   WBC 7.5 07/26/2023   HGB 10.1 (L) 07/26/2023   HCT 33.9 (L) 07/26/2023   MCV 100.6 (H) 07/26/2023   PLT 166 07/26/2023    Lab Results  Component Value Date   CREATININE 1.26 (H) 07/26/2023    Lab Results  Component Value Date   PSA 0.3 09/30/2019   PSA 0.3 06/24/2019   PSA 0.4 03/26/2019    No results found for: TESTOSTERONE   No results found for: HGBA1C  Urinalysis    Component Value Date/Time   COLORURINE YELLOW 10/14/2006 1821   APPEARANCEUR Clear 04/15/2020 1358   LABSPEC 1.010 10/14/2006 1821   PHURINE 5.5 10/14/2006 1821   GLUCOSEU Negative 04/15/2020 1358   HGBUR TRACE (A) 10/14/2006 1821   BILIRUBINUR Negative 04/15/2020 1358   KETONESUR NEGATIVE 10/14/2006 1821   PROTEINUR Negative 04/15/2020 1358   PROTEINUR NEGATIVE 10/14/2006 1821   UROBILINOGEN 0.2 10/14/2006 1821   NITRITE Negative 04/15/2020 1358   NITRITE NEGATIVE 10/14/2006 1821   LEUKOCYTESUR Trace (A) 04/15/2020 1358    Lab Results  Component Value Date   LABMICR See below: 04/15/2020   WBCUA 0-5 04/15/2020   LABEPIT 0-10 04/15/2020   BACTERIA None seen 04/15/2020    Pertinent Imaging:  No results found for this or any previous visit.  No results found for this or any previous visit.  No results found for this or any previous visit.  No results found for this or any previous visit.  No results found for this or any previous visit.  No results found for this or any previous visit.  No results found for this or any previous visit.  No results found for this or any previous visit.   Assessment & Plan:    1. 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 Followup 3 months with PSA   No follow-ups on file.  Belvie Clara, MD  [facility] [place]

## 2023-09-18 ENCOUNTER — Encounter (HOSPITAL_COMMUNITY): Payer: Self-pay

## 2023-09-18 ENCOUNTER — Other Ambulatory Visit (HOSPITAL_COMMUNITY): Payer: Self-pay

## 2023-09-20 ENCOUNTER — Other Ambulatory Visit (HOSPITAL_COMMUNITY): Payer: Self-pay

## 2023-09-21 ENCOUNTER — Other Ambulatory Visit: Payer: Self-pay

## 2023-09-21 NOTE — Progress Notes (Signed)
 Specialty Pharmacy Refill Coordination Note  ZARION OLIFF is a 89 y.o. male contacted today regarding refills of specialty medication(s) Abiraterone  Acetate (ZYTIGA )   Patient requested Delivery   Delivery date: 09/25/23   Verified address: [address]   [place] [address]   Medication will be filled on 09/24/23.

## 2023-09-26 DIAGNOSIS — J449 Chronic obstructive pulmonary disease, unspecified: Secondary | ICD-10-CM | POA: Diagnosis not present

## 2023-09-26 DIAGNOSIS — R0902 Hypoxemia: Secondary | ICD-10-CM | POA: Diagnosis not present

## 2023-10-12 ENCOUNTER — Other Ambulatory Visit: Payer: Self-pay

## 2023-10-12 ENCOUNTER — Other Ambulatory Visit: Payer: Self-pay | Admitting: Hematology

## 2023-10-12 NOTE — Progress Notes (Signed)
 Specialty Pharmacy Ongoing Clinical Assessment Note  Danny Martinez is a 89 y.o. male who is being followed by the specialty pharmacy service for RxSp Oncology   Patient's specialty medication(s) reviewed today: Abiraterone  Acetate (ZYTIGA )   Missed doses in the last 4 weeks: 0   Patient/Caregiver did not have any additional questions or concerns.   Therapeutic benefit summary: Patient is achieving benefit   Adverse events/side effects summary: No adverse events/side effects   Patient's therapy is appropriate to: Continue    Goals Addressed             This Visit's Progress    Maintain optimal adherence to therapy   On track    Patient is on track. Patient will maintain adherence         Follow up: 3 months  [facility] Specialty Pharmacist

## 2023-10-12 NOTE — Progress Notes (Signed)
 Specialty Pharmacy Refill Coordination Note  Danny Martinez is a 89 y.o. male contacted today regarding refills of specialty medication(s) Abiraterone  Acetate (ZYTIGA )   Patient requested Delivery   Delivery date: 10/19/23   Verified address: [address]   [place] [address]   Medication will be filled on 10/18/23. This fill date is pending response to refill request from provider. Patient is aware and if they have not received fill by intended date they must follow up with pharmacy.

## 2023-10-15 ENCOUNTER — Other Ambulatory Visit: Payer: Self-pay

## 2023-10-15 ENCOUNTER — Other Ambulatory Visit (HOSPITAL_COMMUNITY): Payer: Self-pay

## 2023-10-15 ENCOUNTER — Encounter: Payer: Self-pay | Admitting: Hematology

## 2023-10-15 MED ORDER — ABIRATERONE ACETATE 250 MG PO TABS
1000.0000 mg | ORAL_TABLET | Freq: Every day | ORAL | 2 refills | Status: DC
  Filled 2023-10-15: qty 120, 30d supply, fill #0
  Filled 2023-11-16 – 2023-11-30 (×3): qty 120, 30d supply, fill #1
  Filled 2023-12-25: qty 120, 30d supply, fill #2

## 2023-10-15 NOTE — Progress Notes (Signed)
 mail 8.11: LVM. RTSoon until 8.9.

## 2023-10-22 ENCOUNTER — Other Ambulatory Visit: Payer: Self-pay

## 2023-10-24 ENCOUNTER — Inpatient Hospital Stay: Attending: Hematology

## 2023-10-24 DIAGNOSIS — K219 Gastro-esophageal reflux disease without esophagitis: Secondary | ICD-10-CM | POA: Diagnosis not present

## 2023-10-24 DIAGNOSIS — C61 Malignant neoplasm of prostate: Secondary | ICD-10-CM

## 2023-10-24 DIAGNOSIS — Z7952 Long term (current) use of systemic steroids: Secondary | ICD-10-CM | POA: Diagnosis not present

## 2023-10-24 DIAGNOSIS — Z8546 Personal history of malignant neoplasm of prostate: Secondary | ICD-10-CM | POA: Insufficient documentation

## 2023-10-24 DIAGNOSIS — Z08 Encounter for follow-up examination after completed treatment for malignant neoplasm: Secondary | ICD-10-CM | POA: Insufficient documentation

## 2023-10-24 DIAGNOSIS — N189 Chronic kidney disease, unspecified: Secondary | ICD-10-CM | POA: Diagnosis not present

## 2023-10-24 DIAGNOSIS — D631 Anemia in chronic kidney disease: Secondary | ICD-10-CM | POA: Diagnosis not present

## 2023-10-24 DIAGNOSIS — D509 Iron deficiency anemia, unspecified: Secondary | ICD-10-CM | POA: Diagnosis not present

## 2023-10-24 LAB — IRON AND TIBC
Iron: 63 ug/dL (ref 45–182)
Saturation Ratios: 22 % (ref 17.9–39.5)
TIBC: 287 ug/dL (ref 250–450)
UIBC: 224 ug/dL

## 2023-10-24 LAB — CBC WITH DIFFERENTIAL/PLATELET
Abs Immature Granulocytes: 0.03 K/uL (ref 0.00–0.07)
Basophils Absolute: 0 K/uL (ref 0.0–0.1)
Basophils Relative: 0 %
Eosinophils Absolute: 0.1 K/uL (ref 0.0–0.5)
Eosinophils Relative: 2 %
HCT: 32.2 % — ABNORMAL LOW (ref 39.0–52.0)
Hemoglobin: 9.8 g/dL — ABNORMAL LOW (ref 13.0–17.0)
Immature Granulocytes: 0 %
Lymphocytes Relative: 16 %
Lymphs Abs: 1.2 K/uL (ref 0.7–4.0)
MCH: 30.8 pg (ref 26.0–34.0)
MCHC: 30.4 g/dL (ref 30.0–36.0)
MCV: 101.3 fL — ABNORMAL HIGH (ref 80.0–100.0)
Monocytes Absolute: 0.9 K/uL (ref 0.1–1.0)
Monocytes Relative: 13 %
Neutro Abs: 5.1 K/uL (ref 1.7–7.7)
Neutrophils Relative %: 69 %
Platelets: 145 K/uL — ABNORMAL LOW (ref 150–400)
RBC: 3.18 MIL/uL — ABNORMAL LOW (ref 4.22–5.81)
RDW: 12.9 % (ref 11.5–15.5)
WBC: 7.4 K/uL (ref 4.0–10.5)
nRBC: 0 % (ref 0.0–0.2)

## 2023-10-24 LAB — COMPREHENSIVE METABOLIC PANEL WITH GFR
ALT: 24 U/L (ref 0–44)
AST: 21 U/L (ref 15–41)
Albumin: 3.5 g/dL (ref 3.5–5.0)
Alkaline Phosphatase: 55 U/L (ref 38–126)
Anion gap: 9 (ref 5–15)
BUN: 21 mg/dL (ref 8–23)
CO2: 34 mmol/L — ABNORMAL HIGH (ref 22–32)
Calcium: 10 mg/dL (ref 8.9–10.3)
Chloride: 95 mmol/L — ABNORMAL LOW (ref 98–111)
Creatinine, Ser: 1.18 mg/dL (ref 0.61–1.24)
GFR, Estimated: 59 mL/min — ABNORMAL LOW (ref >60)
Glucose, Bld: 120 mg/dL — ABNORMAL HIGH (ref 70–99)
Potassium: 4.4 mmol/L (ref 3.5–5.1)
Sodium: 138 mmol/L (ref 135–145)
Total Bilirubin: 0.4 mg/dL (ref 0.0–1.2)
Total Protein: 6.5 g/dL (ref 6.5–8.1)

## 2023-10-24 LAB — PSA: Prostatic Specific Antigen: 0.46 ng/mL (ref 0.00–4.00)

## 2023-10-24 LAB — FERRITIN: Ferritin: 260 ng/mL (ref 24–336)

## 2023-10-27 DIAGNOSIS — J449 Chronic obstructive pulmonary disease, unspecified: Secondary | ICD-10-CM | POA: Diagnosis not present

## 2023-10-27 DIAGNOSIS — R0902 Hypoxemia: Secondary | ICD-10-CM | POA: Diagnosis not present

## 2023-10-31 ENCOUNTER — Inpatient Hospital Stay: Admitting: Oncology

## 2023-10-31 VITALS — BP 146/65 | HR 74 | Temp 98.4°F | Resp 18 | Ht 73.5 in | Wt 214.9 lb

## 2023-10-31 DIAGNOSIS — D649 Anemia, unspecified: Secondary | ICD-10-CM | POA: Diagnosis not present

## 2023-10-31 DIAGNOSIS — N189 Chronic kidney disease, unspecified: Secondary | ICD-10-CM | POA: Diagnosis not present

## 2023-10-31 DIAGNOSIS — Z7952 Long term (current) use of systemic steroids: Secondary | ICD-10-CM | POA: Diagnosis not present

## 2023-10-31 DIAGNOSIS — Z8546 Personal history of malignant neoplasm of prostate: Secondary | ICD-10-CM | POA: Diagnosis not present

## 2023-10-31 DIAGNOSIS — D631 Anemia in chronic kidney disease: Secondary | ICD-10-CM | POA: Diagnosis not present

## 2023-10-31 DIAGNOSIS — D509 Iron deficiency anemia, unspecified: Secondary | ICD-10-CM | POA: Diagnosis not present

## 2023-10-31 DIAGNOSIS — Z08 Encounter for follow-up examination after completed treatment for malignant neoplasm: Secondary | ICD-10-CM | POA: Diagnosis not present

## 2023-10-31 DIAGNOSIS — K219 Gastro-esophageal reflux disease without esophagitis: Secondary | ICD-10-CM | POA: Diagnosis not present

## 2023-10-31 DIAGNOSIS — C61 Malignant neoplasm of prostate: Secondary | ICD-10-CM

## 2023-10-31 NOTE — Patient Instructions (Addendum)
 [facility] at [facility] Discharge Instructions   You were seen and examined today by Dr. Davonna.  She reviewed the results of your lab work which are mostly normal/stable. Your B12 is low. Dr. Davonna would like for you to start taking over the counter B12 tablets (1 mg/1000 mcg) daily to help correct this.   Continue Zytiga  and prednisone  as prescribed.   We will see you back in 3 months. We will repeat lab work prior to this visit.    Return as scheduled.    Thank you for choosing [facility] at [facility] to provide your oncology and hematology care.  To afford each patient quality time with our provider, please arrive at least 15 minutes before your scheduled appointment time.   If you have a lab appointment with the Cancer Center please come in thru the Main Entrance and check in at the main information desk.  You need to re-schedule your appointment should you arrive 10 or more minutes late.  We strive to give you quality time with our providers, and arriving late affects you and other patients whose appointments are after yours.  Also, if you no show three or more times for appointments you may be dismissed from the clinic at the providers discretion.     Again, thank you for choosing [facility].  Our hope is that these requests will decrease the amount of time that you wait before being seen by our physicians.       _____________________________________________________________  Should you have questions after your visit to [facility], please contact our office at [phone] and follow the prompts.  Our office hours are 8:00 a.m. and 4:30 p.m. Monday - Friday.  Please note that voicemails left after 4:00 p.m. may not be returned until the following business day.  We are closed weekends and major holidays.  You do have access to a nurse 24-7, just call the main number to the clinic [phone] and do not  press any options, hold on the line and a nurse will answer the phone.    For prescription refill requests, have your pharmacy contact our office and allow 72 hours.    Due to Covid, you will need to wear a mask upon entering the hospital. If you do not have a mask, a mask will be given to you at the Main Entrance upon arrival. For doctor visits, patients may have 1 support person age 19 or older with them. For treatment visits, patients can not have anyone with them due to social distancing guidelines and our immunocompromised population.

## 2023-10-31 NOTE — Progress Notes (Signed)
 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 male was transferred to my care after his prior physician has left.   ASSESSMENT & PLAN:   Assessment & Plan: Danny Martinez  is a 89 y.o. 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  Oncology history below. S/p radical resection in 2003 Progression in 2023 with PSA elevation and PSMA PET showing lymphadenopathy. Started on enzalutamide , discontinued due to seizures Currently on abiraterone  and prednisone   - Patient tolerating abiraterone  and prednisone  really well except for some gastric reflux.  Very compliant with medications - Labs reviewed today: PSA 0.46.  Improved from prior - Continue abiraterone  1000 mg daily and prednisone  5 mg daily  Return to clinic in 3 months with labs Normocytic anemia Patient has a history of iron deficiency anemia and anemia secondary to CKD.  Likely contribution from abiraterone  as well Several doses of IV iron so far  - Labs reviewed today hemoglobin 9.8, more or less stable.  Iron panel within normal limits. -Creatinine is normal today. -Continue to monitor for now Hypercalcemia Patient has mild hypercalcemia previously.  Resolved at this time.    The patient understands the plans discussed today and is in agreement with them.  He knows to contact our office if he develops concerns prior to his next appointment.  60 minutes of total time was spent for this patient encounter, including preparation, face-to-face counseling with the patient and coordination of care, physical exam, and documentation of the  encounter.    Mickiel Dry, MD  [facility] [place] - A DEPT OF [facility] [address] [place] [address] Dept: [phone] Dept Fax: [phone]   Orders Placed This Encounter  Procedures   CBC with Differential    Standing Status:   Future    Expected Date:   01/28/2024    Expiration Date:   04/27/2024   Comprehensive metabolic panel    Standing Status:   Future    Expected Date:   01/28/2024    Expiration Date:   04/27/2024   Iron and TIBC ([facility] ONLY)    Standing Status:   Future    Expected Date:   01/28/2024    Expiration Date:   04/27/2024   Ferritin    Standing Status:   Future    Expected Date:   01/28/2024    Expiration Date:   04/27/2024   Vitamin B12    Standing Status:   Future    Expected Date:   01/28/2024    Expiration Date:   04/27/2024   Folate    Standing Status:   Future    Expected Date:   01/28/2024    Expiration Date:   04/27/2024   PSA    Standing Status:   Future    Expected Date:   01/28/2024    Expiration Date:   04/27/2024     ONCOLOGY HISTORY:   I have reviewed his chart and materials related to his cancer extensively and collaborated history with the patient. Summary of oncologic history is as follows:   Metastatic CRPC to  pelvic lymph nodes:   -01/28/2001: Pathology: Prostate biopsy: Invasive adenocarcinoma, Gleasons grade 3+3=6. -04/10/2001: Retropubic Radical prostatectomy. Pathology: Prostate: Infiltrating adenocarcinoma of left lobe, Right left iliac and obturator lymph nodes: No tumor identified - Had biochemical failure and was on leupron for 5-6 years. He came off of Lupron  and the PSA rose again so he is now on chronic Lupron  therapy q3 months ( From Dr.Mckenzie's note on 03/26/2019) -08/13/2015: PSA < 0.1 -03/26/2019: PSA: 0.4 -03/26/2019-12/06/2022: Leuprolide  22.5mg  every 3 months -10/29/2020: NM Bone scan: No scintigraphic evidence of  bony metastatic disease.  -10/29/2020: CT A&P w contrast: No acute findings. No evidence of recurrent or metastatic carcinoma within the abdomen or pelvis. -10/17/2021: PSA:2.1 -12/01/2021: PSMA PET: No evidence of prostate cancer recurrence in the prostatectomy bed. Multiple small intensely radiotracer avid prostate cancer metastasis to pelvic lymph nodes involving the RIGHT common iliac nodes, RIGHT external iliac nodes and RIGHT internal iliac nodes. Most superior metastatic node is at the L4 vertebral body level RIGHT of the IVC. No visceral metastasis or skeletal metastasis. -12/07/2021-07/17/2022: Enzalutamide  160mg  daily. Discontinued for seizures -12/18/2022: PSA: 2.23 -12/18/2022-Current: Abiraterone  1000mg  daily + prednisone  5mg  daily -12/26/2022: Invitae germline mutation test: Negative -12/28/2022: PET:  Near identical pattern of intense radiotracer avid metastatic RIGHT iliac lymph nodes. No evidence of progression or improvement from comparison exam. No evidence of local recurrence in the  prostatectomy bed. No evidence of visceral metastasis or skeletal metastasis. -03/16/2023-Current: Leuprolide  22.5mg  every 3 months  Current Treatment:  Abiraterone  1000mg  daily + prednisone  5mg  daily  INTERVAL HISTORY:   Danny Martinez is here today for follow up. Patient is accompanied by his wife today. He reports some gastric reflux that he has been managing with tums. He denies fatigue, nausea, vomiting, diarrhea, abdominal pain.  Overall, is at baseline and is tolerating  abiraterone  well.    I have reviewed the past medical history, past surgical history, social history and family history with the patient and they are unchanged from previous note.  ALLERGIES:  is allergic to neomycin-bacitracin zn-polymyx.  MEDICATIONS:  Current Outpatient Medications  Medication Sig Dispense Refill   abiraterone  acetate (ZYTIGA ) 250 MG tablet Take 4 tablets (1,000 mg total) by mouth daily. Take on an  empty stomach 1 hour before or 2 hours after a meal 120 tablet 2   albuterol  (PROVENTIL ) (2.5 MG/3ML) 0.083% nebulizer solution Take 2.5 mg by nebulization every 4 (four) hours as needed.     apixaban (ELIQUIS) 5 MG TABS tablet Take by mouth.     cyanocobalamin  (VITAMIN B12) 1000 MCG tablet Take 1,000 mcg by mouth daily.     famotidine (PEPCID) 20 MG tablet Take 20 mg by mouth 2 (two) times daily.     Fluticasone-Salmeterol (ADVAIR) 250-50 MCG/DOSE AEPB Inhale 1 puff into the lungs every 12 (twelve) hours.     LINZESS 72 MCG capsule Take 72 mcg by mouth daily.     metFORMIN (GLUCOPHAGE-XR) 500 MG 24 hr tablet SMARTSIG:1 Tablet(s) By Mouth Every Evening     metoprolol tartrate (LOPRESSOR) 25 MG tablet Take by mouth.     OXYGEN  Inhale 3.5 L into the lungs.     Oyster Shell Calcium 500 MG TABS Take 1 tablet by mouth 2 (two) times daily.     predniSONE  (DELTASONE ) 5 MG tablet TAKE 1 TABLET BY MOUTH DAILY WITH BREAKFAST 30 tablet 3   sodium chloride , hypertonic, 3 % solution SMARTSIG:3 Milliliter(s) Via Nebulizer Twice Daily PRN     telmisartan-hydrochlorothiazide (MICARDIS HCT)  80-25 MG tablet Take 1 tablet by mouth daily.     tiotropium (SPIRIVA) 18 MCG inhalation capsule Place 18 mcg into inhaler and inhale daily.     Current Facility-Administered Medications  Medication Dose Route Frequency Provider Last Rate Last Admin   leuprolide  (LUPRON ) injection 22.5 mg  22.5 mg Intramuscular Q90 days Sherrilee Belvie CROME, MD   22.5 mg at 09/12/23 1117   Leuprolide  Acetate (3 Month) (ELIGARD ) 22.5 MG injection 22.5 mg  22.5 mg Subcutaneous Q90 days Sherrilee Belvie CROME, MD   22.5 mg at 09/08/22 1209    REVIEW OF SYSTEMS:   Constitutional: Denies fevers, chills or abnormal weight loss Eyes: Denies blurriness of vision Ears, nose, mouth, throat, and face: Denies mucositis or sore throat Respiratory: Denies cough, dyspnea or wheezes Cardiovascular: Denies palpitation, chest discomfort or lower extremity  swelling Gastrointestinal:  Denies nausea, heartburn or change in bowel habits Skin: Denies abnormal skin rashes Lymphatics: Denies new lymphadenopathy or easy bruising Neurological:Denies numbness, tingling or new weaknesses Behavioral/Psych: Mood is stable, no new changes  All other systems were reviewed with the patient and are negative.   VITALS:  Blood pressure (!) 146/65, pulse 74, temperature 98.4 F (36.9 C), temperature source Tympanic, resp. rate 18, height 6' 1.5 (1.867 m), weight 214 lb 14.4 oz (97.5 kg), SpO2 100%.  Wt Readings from Last 3 Encounters:  10/31/23 214 lb 14.4 oz (97.5 kg)  07/31/23 218 lb 3.2 oz (99 kg)  05/08/23 216 lb 14.9 oz (98.4 kg)    Body mass index is 27.97 kg/m.  Performance status (ECOG): 2 - Symptomatic, <50% confined to bed  PHYSICAL EXAM:   GENERAL:alert, no distress and comfortable SKIN: skin color, texture, turgor are normal, no rashes or significant lesions LYMPH:  no palpable lymphadenopathy in the cervical, axillary or inguinal LUNGS: clear to auscultation and percussion with normal breathing effort HEART: regular rate & rhythm and no murmurs and no lower extremity edema ABDOMEN:abdomen soft, non-tender and normal bowel sounds Musculoskeletal:no cyanosis of digits and no clubbing  NEURO: alert & oriented x 3 with fluent speech, no focal motor/sensory deficits  LABORATORY DATA:  I have reviewed the data as listed    Component Value Date/Time   NA 138 10/24/2023 1346   NA 143 04/18/2022 0931   K 4.4 10/24/2023 1346   CL 95 (L) 10/24/2023 1346   CO2 34 (H) 10/24/2023 1346   GLUCOSE 120 (H) 10/24/2023 1346   BUN 21 10/24/2023 1346   BUN 20 04/18/2022 0931   CREATININE 1.18 10/24/2023 1346   CALCIUM 10.0 10/24/2023 1346   PROT 6.5 10/24/2023 1346   PROT 6.5 04/18/2022 0931   ALBUMIN 3.5 10/24/2023 1346   ALBUMIN 4.2 04/18/2022 0931   AST 21 10/24/2023 1346   ALT 24 10/24/2023 1346   ALKPHOS 55 10/24/2023 1346   BILITOT 0.4  10/24/2023 1346   BILITOT <0.2 04/18/2022 0931   GFRNONAA 59 (L) 10/24/2023 1346   GFRAA (L) 10/20/2006 0535    53        The eGFR has been calculated using the MDRD equation. This calculation has not been validated in all clinical    Lab Results  Component Value Date   WBC 7.4 10/24/2023   NEUTROABS 5.1 10/24/2023   HGB 9.8 (L) 10/24/2023   HCT 32.2 (L) 10/24/2023   MCV 101.3 (H) 10/24/2023   PLT 145 (L) 10/24/2023      Chemistry      Component Value Date/Time   NA 138  10/24/2023 1346   NA 143 04/18/2022 0931   K 4.4 10/24/2023 1346   CL 95 (L) 10/24/2023 1346   CO2 34 (H) 10/24/2023 1346   BUN 21 10/24/2023 1346   BUN 20 04/18/2022 0931   CREATININE 1.18 10/24/2023 1346      Component Value Date/Time   CALCIUM 10.0 10/24/2023 1346   ALKPHOS 55 10/24/2023 1346   AST 21 10/24/2023 1346   ALT 24 10/24/2023 1346   BILITOT 0.4 10/24/2023 1346   BILITOT <0.2 04/18/2022 0931      Latest Reference Range & Units 10/24/23 13:46  Prostatic Specific Antigen 0.00 - 4.00 ng/mL 0.46    Latest Reference Range & Units 10/24/23 13:46  Iron 45 - 182 ug/dL 63  UIBC ug/dL 775  TIBC 749 - 549 ug/dL 712  Saturation Ratios 17.9 - 39.5 % 22  Ferritin 24 - 336 ng/mL 260    RADIOGRAPHIC STUDIES: I have personally reviewed the radiological images as listed and agreed with the findings in the report.  NM PET (PSMA) SKULL TO MID THIGH CLINICAL DATA:  Prostate cancer nodal metastasis.  EXAM: NUCLEAR MEDICINE PET SKULL BASE TO THIGH  TECHNIQUE: 8.5 mCi Flotufolastat (Posluma ) was injected intravenously. Full-ring PET imaging was performed from the skull base to thigh after the radiotracer. CT data was obtained and used for attenuation correction and anatomic localization.  COMPARISON:  PSMA PET scan 12/01/2021  FINDINGS: NECK  No radiotracer activity in neck lymph nodes.  Incidental CT finding: None.  CHEST  No radiotracer accumulation within mediastinal or hilar  lymph nodes. No suspicious pulmonary nodules on the CT scan.  Incidental CT finding: Marked symmetric bilateral gynecomastia. No change from prior.  ABDOMEN/PELVIS  Prostate: No focal activity in the prostate bed.  Lymph nodes: Again demonstrated intense radiotracer avid iRIGHT common iliac, internal iliac and external iliac lymph nodes. Near identical pattern to comparison exam. for example RIGHT internal iliac lymph node along the operator fossa measures 10 mm (127) compared to 10 mm on prior with SUV max equal 30.7 compared SUV max equal 43.5.  RIGHT external iliac node on image 148 measures 5 mm with SUV max equal 33.5 compared SUV max equal 43.5 on prior. No new pelvic lymph nodes.  No radiotracer avid lymph nodes present outside of the pelvis.  Liver: No evidence of liver metastasis.  Incidental CT finding: None.  SKELETON  No focal activity to suggest skeletal metastasis.  IMPRESSION: 1. Near identical pattern of intense radiotracer avid metastatic RIGHT iliac lymph nodes. No evidence of progression or improvement from comparison exam. 2. No evidence of local recurrence in the  prostatectomy bed. 3. No evidence of visceral metastasis or skeletal metastasis. 4. Marked symmetric bilateral gynecomastia.  No interval change.  Electronically Signed   By: Jackquline Boxer M.D.   On: 01/03/2023 12:50

## 2023-11-10 ENCOUNTER — Encounter: Payer: Self-pay | Admitting: Oncology

## 2023-11-10 DIAGNOSIS — D649 Anemia, unspecified: Secondary | ICD-10-CM | POA: Insufficient documentation

## 2023-11-10 NOTE — Assessment & Plan Note (Signed)
 Patient has a history of iron deficiency anemia and anemia secondary to CKD.  Likely contribution from abiraterone  as well Several doses of IV iron so far  - Labs reviewed today hemoglobin 9.8, more or less stable.  Iron panel within normal limits. -Creatinine is normal today. -Continue to monitor for now

## 2023-11-10 NOTE — Assessment & Plan Note (Addendum)
 Patient has mild hypercalcemia previously.  Resolved at this time.

## 2023-11-10 NOTE — Assessment & Plan Note (Signed)
 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  Oncology history below. S/p radical resection in 2003 Progression in 2023 with PSA elevation and PSMA PET showing lymphadenopathy. Started on enzalutamide , discontinued due to seizures Currently on abiraterone  and prednisone   - Patient tolerating abiraterone  and prednisone  really well except for some gastric reflux.  Very compliant with medications - Labs reviewed today: PSA 0.46.  Improved from prior - Continue abiraterone  1000 mg daily and prednisone  5 mg daily  Return to clinic in 3 months with labs

## 2023-11-14 ENCOUNTER — Other Ambulatory Visit (HOSPITAL_COMMUNITY): Payer: Self-pay

## 2023-11-16 ENCOUNTER — Other Ambulatory Visit: Payer: Self-pay

## 2023-11-19 ENCOUNTER — Other Ambulatory Visit (HOSPITAL_COMMUNITY): Payer: Self-pay

## 2023-11-21 ENCOUNTER — Encounter: Payer: Self-pay | Admitting: *Deleted

## 2023-11-21 DIAGNOSIS — K080 Exfoliation of teeth due to systemic causes: Secondary | ICD-10-CM | POA: Diagnosis not present

## 2023-11-22 DIAGNOSIS — I10 Essential (primary) hypertension: Secondary | ICD-10-CM | POA: Diagnosis not present

## 2023-11-22 DIAGNOSIS — E7849 Other hyperlipidemia: Secondary | ICD-10-CM | POA: Diagnosis not present

## 2023-11-22 DIAGNOSIS — E1122 Type 2 diabetes mellitus with diabetic chronic kidney disease: Secondary | ICD-10-CM | POA: Diagnosis not present

## 2023-11-22 DIAGNOSIS — N182 Chronic kidney disease, stage 2 (mild): Secondary | ICD-10-CM | POA: Diagnosis not present

## 2023-11-27 DIAGNOSIS — R0902 Hypoxemia: Secondary | ICD-10-CM | POA: Diagnosis not present

## 2023-11-27 DIAGNOSIS — J449 Chronic obstructive pulmonary disease, unspecified: Secondary | ICD-10-CM | POA: Diagnosis not present

## 2023-11-30 ENCOUNTER — Other Ambulatory Visit: Payer: Self-pay

## 2023-11-30 ENCOUNTER — Other Ambulatory Visit (HOSPITAL_COMMUNITY): Payer: Self-pay

## 2023-11-30 NOTE — Progress Notes (Signed)
 Specialty Pharmacy Refill Coordination Note  Danny Martinez is a 89 y.o. male contacted today regarding refills of specialty medication(s) Abiraterone  Acetate (ZYTIGA )   Patient requested [name] at [facility] at [place] date: 11/30/23   Medication will be filled on 11/30/23.

## 2023-12-05 ENCOUNTER — Encounter: Payer: Self-pay | Admitting: Oncology

## 2023-12-12 ENCOUNTER — Ambulatory Visit: Admitting: Urology

## 2023-12-21 ENCOUNTER — Ambulatory Visit: Admitting: Urology

## 2023-12-21 ENCOUNTER — Other Ambulatory Visit: Payer: Self-pay

## 2023-12-21 ENCOUNTER — Encounter: Payer: Self-pay | Admitting: Urology

## 2023-12-21 VITALS — BP 155/74 | HR 71

## 2023-12-21 DIAGNOSIS — C61 Malignant neoplasm of prostate: Secondary | ICD-10-CM | POA: Diagnosis not present

## 2023-12-21 DIAGNOSIS — C775 Secondary and unspecified malignant neoplasm of intrapelvic lymph nodes: Secondary | ICD-10-CM

## 2023-12-21 NOTE — Patient Instructions (Signed)

## 2023-12-21 NOTE — Progress Notes (Signed)
 12/21/2023 12:09 PM   [place] JONETTA Lunger 09-04-1933 [id number]  Referring provider: Orpha Yancey LABOR, MD [address] [place],  [place] [address]  No chief complaint on file.   HPI: Mr Boomer is a 89yo here for follouwop for metastatic prostate cancer. PSA 0.46 which has decreased from 0.55. He remains on xytiga daily and eligard  every 3 months. Mild hot flashes. Nocturia 1-2x. No urinary incontinence. No pad usage.    PMH: Past Medical History:  Diagnosis Date   COPD (chronic obstructive pulmonary disease) (HCC)    Emphysema    History of prostate cancer    Hypoxemia    Pulmonary hypertension (HCC)    PVC (premature ventricular contraction)     Surgical History: Past Surgical History:  Procedure Laterality Date   CYSTECTOMY     PROSTATECTOMY      Home Medications:  Allergies as of 12/21/2023       Reactions   Neomycin-bacitracin Zn-polymyx    REACTION: rash        Medication List        Accurate as of December 21, 2023 12:09 PM. If you have any questions, ask your nurse or doctor.          abiraterone  acetate 250 MG tablet Commonly known as: ZYTIGA  Take 4 tablets (1,000 mg total) by mouth daily. Take on an empty stomach 1 hour before or 2 hours after a meal   albuterol  (2.5 MG/3ML) 0.083% nebulizer solution Commonly known as: PROVENTIL  Take 2.5 mg by nebulization every 4 (four) hours as needed.   apixaban 5 MG Tabs tablet Commonly known as: ELIQUIS Take by mouth.   cyanocobalamin  1000 MCG tablet Commonly known as: VITAMIN B12 Take 1,000 mcg by mouth daily.   famotidine 20 MG tablet Commonly known as: PEPCID Take 20 mg by mouth 2 (two) times daily.   Fluticasone-Salmeterol 250-50 MCG/DOSE Aepb Commonly known as: ADVAIR Inhale 1 puff into the lungs every 12 (twelve) hours.   Linzess 72 MCG capsule Generic drug: linaclotide Take 72 mcg by mouth daily.   metFORMIN 500 MG 24 hr tablet Commonly known as: GLUCOPHAGE-XR SMARTSIG:1 Tablet(s)  By Mouth Every Evening   metoprolol tartrate 25 MG tablet Commonly known as: LOPRESSOR Take by mouth.   OXYGEN  Inhale 3.5 L into the lungs.   Oyster Shell Calcium 500 MG Tabs Take 1 tablet by mouth 2 (two) times daily.   predniSONE  5 MG tablet Commonly known as: DELTASONE  TAKE 1 TABLET BY MOUTH DAILY WITH BREAKFAST   sodium chloride  (hypertonic) 3 % solution SMARTSIG:3 Milliliter(s) Via Nebulizer Twice Daily PRN   telmisartan-hydrochlorothiazide 80-25 MG tablet Commonly known as: MICARDIS HCT Take 1 tablet by mouth daily.   tiotropium 18 MCG inhalation capsule Commonly known as: SPIRIVA Place 18 mcg into inhaler and inhale daily.        Allergies:  Allergies  Allergen Reactions   Neomycin-Bacitracin Zn-Polymyx     REACTION: rash    Family History: Family History  Problem Relation Age of Onset   Sarcoidosis Brother    Heart disease Father    Rheum arthritis Sister    Rheum arthritis Sister    Rheum arthritis Father    Cancer Mother     Social History:  reports that he quit smoking about 41 years ago. His smoking use included cigarettes. He started smoking about 61 years ago. He has a 20 pack-year smoking history. He has never used smokeless tobacco. He reports that he does not drink alcohol and  does not use drugs.  ROS: All other review of systems were reviewed and are negative except what is noted above in HPI  Physical Exam: BP (!) 155/74   Pulse 71   Constitutional:  Alert and oriented, No acute distress. HEENT: [place] AT, moist mucus membranes.  Trachea midline, no masses. Cardiovascular: No clubbing, cyanosis, or edema. Respiratory: Normal respiratory effort, no increased work of breathing. GI: Abdomen is soft, nontender, nondistended, no abdominal masses GU: No CVA tenderness.  Lymph: No cervical or inguinal lymphadenopathy. Skin: No rashes, bruises or suspicious lesions. Neurologic: Grossly intact, no focal deficits, moving all 4  extremities. Psychiatric: Normal mood and affect.  Laboratory Data: Lab Results  Component Value Date   WBC 7.4 10/24/2023   HGB 9.8 (L) 10/24/2023   HCT 32.2 (L) 10/24/2023   MCV 101.3 (H) 10/24/2023   PLT 145 (L) 10/24/2023    Lab Results  Component Value Date   CREATININE 1.18 10/24/2023    Lab Results  Component Value Date   PSA 0.3 09/30/2019   PSA 0.3 06/24/2019   PSA 0.4 03/26/2019    No results found for: TESTOSTERONE   No results found for: HGBA1C  Urinalysis    Component Value Date/Time   COLORURINE YELLOW 10/14/2006 1821   APPEARANCEUR Clear 04/15/2020 1358   LABSPEC 1.010 10/14/2006 1821   PHURINE 5.5 10/14/2006 1821   GLUCOSEU Negative 04/15/2020 1358   HGBUR TRACE (A) 10/14/2006 1821   BILIRUBINUR Negative 04/15/2020 1358   KETONESUR NEGATIVE 10/14/2006 1821   PROTEINUR Negative 04/15/2020 1358   PROTEINUR NEGATIVE 10/14/2006 1821   UROBILINOGEN 0.2 10/14/2006 1821   NITRITE Negative 04/15/2020 1358   NITRITE NEGATIVE 10/14/2006 1821   LEUKOCYTESUR Trace (A) 04/15/2020 1358    Lab Results  Component Value Date   LABMICR See below: 04/15/2020   WBCUA 0-5 04/15/2020   LABEPIT 0-10 04/15/2020   BACTERIA None seen 04/15/2020    Pertinent Imaging:  No results found for this or any previous visit.  No results found for this or any previous visit.  No results found for this or any previous visit.  No results found for this or any previous visit.  No results found for this or any previous visit.  No results found for this or any previous visit.  No results found for this or any previous visit.  No results found for this or any previous visit.   Assessment & Plan:    1. 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  - Urinalysis, Routine w reflex microscopic   No follow-ups on file.  Belvie Clara, MD  [facility] [place]

## 2023-12-21 NOTE — Progress Notes (Signed)
 Eligard  SubQ Injection   Due to Prostate Cancer patient is present today for a Eligard  Injection. Order reviewed. Prior Authorization reviewed.  Medication: Eligard  3 month Dose: 22.5 mg  Location: left upper abdomen  site prepped and cleaned with alcohol prior to giving injection.  Lot: [id number] Exp: 12/11/2024  Patient tolerated well, no complications were noted  Performed by: Carlos, CMA

## 2023-12-25 ENCOUNTER — Other Ambulatory Visit: Payer: Self-pay

## 2023-12-25 NOTE — Progress Notes (Signed)
 Specialty Pharmacy Refill Coordination Note  Danny Martinez is a 89 y.o. male contacted today regarding refills of specialty medication(s) Abiraterone  Acetate (ZYTIGA )   Patient requested [name] at [facility] at [place] date: 12/28/23   Medication will be filled on 12/27/23.

## 2023-12-26 ENCOUNTER — Other Ambulatory Visit: Payer: Self-pay

## 2023-12-27 DIAGNOSIS — R0902 Hypoxemia: Secondary | ICD-10-CM | POA: Diagnosis not present

## 2023-12-28 ENCOUNTER — Other Ambulatory Visit (HOSPITAL_COMMUNITY): Payer: Self-pay

## 2023-12-28 ENCOUNTER — Other Ambulatory Visit: Payer: Self-pay

## 2024-01-07 DIAGNOSIS — E1122 Type 2 diabetes mellitus with diabetic chronic kidney disease: Secondary | ICD-10-CM | POA: Diagnosis not present

## 2024-01-07 DIAGNOSIS — N182 Chronic kidney disease, stage 2 (mild): Secondary | ICD-10-CM | POA: Diagnosis not present

## 2024-01-08 DIAGNOSIS — K080 Exfoliation of teeth due to systemic causes: Secondary | ICD-10-CM | POA: Diagnosis not present

## 2024-01-17 ENCOUNTER — Other Ambulatory Visit: Payer: Self-pay

## 2024-01-17 ENCOUNTER — Other Ambulatory Visit: Payer: Self-pay | Admitting: Hematology

## 2024-01-21 ENCOUNTER — Other Ambulatory Visit: Payer: Self-pay

## 2024-01-21 ENCOUNTER — Other Ambulatory Visit: Payer: Self-pay | Admitting: Hematology

## 2024-01-21 NOTE — Progress Notes (Signed)
 Specialty Pharmacy Refill Coordination Note  Danny Martinez is a 89 y.o. male contacted today regarding refills of specialty medication(s) Abiraterone  Acetate (ZYTIGA )   Patient requested Delivery   Delivery date: 01/25/24   Verified address: [address]   [place] [address]   Medication will be filled on: 01/24/24    This fill date is pending response to refill request from provider. Patient is aware and if they have not received fill by intended date they must follow up with pharmacy.

## 2024-01-23 ENCOUNTER — Encounter: Payer: Self-pay | Admitting: Oncology

## 2024-01-23 ENCOUNTER — Other Ambulatory Visit: Payer: Self-pay

## 2024-01-23 MED ORDER — ABIRATERONE ACETATE 250 MG PO TABS
1000.0000 mg | ORAL_TABLET | Freq: Every day | ORAL | 2 refills | Status: DC
  Filled 2024-01-23: qty 120, 30d supply, fill #0
  Filled 2024-02-15 – 2024-02-21 (×2): qty 120, 30d supply, fill #1
  Filled 2024-03-20 – 2024-03-24 (×2): qty 120, 30d supply, fill #2

## 2024-01-23 NOTE — Telephone Encounter (Signed)
 Chart reviewed. Zytiga  refilled per last office note with Dr. Davonna.

## 2024-01-27 DIAGNOSIS — R0902 Hypoxemia: Secondary | ICD-10-CM | POA: Diagnosis not present

## 2024-01-27 DIAGNOSIS — J449 Chronic obstructive pulmonary disease, unspecified: Secondary | ICD-10-CM | POA: Diagnosis not present

## 2024-01-28 ENCOUNTER — Inpatient Hospital Stay: Attending: Hematology

## 2024-01-28 DIAGNOSIS — Z7952 Long term (current) use of systemic steroids: Secondary | ICD-10-CM | POA: Diagnosis not present

## 2024-01-28 DIAGNOSIS — K5900 Constipation, unspecified: Secondary | ICD-10-CM | POA: Insufficient documentation

## 2024-01-28 DIAGNOSIS — D509 Iron deficiency anemia, unspecified: Secondary | ICD-10-CM

## 2024-01-28 DIAGNOSIS — Z79899 Other long term (current) drug therapy: Secondary | ICD-10-CM | POA: Diagnosis not present

## 2024-01-28 DIAGNOSIS — C61 Malignant neoplasm of prostate: Secondary | ICD-10-CM | POA: Diagnosis not present

## 2024-01-28 DIAGNOSIS — D649 Anemia, unspecified: Secondary | ICD-10-CM | POA: Insufficient documentation

## 2024-01-28 DIAGNOSIS — C775 Secondary and unspecified malignant neoplasm of intrapelvic lymph nodes: Secondary | ICD-10-CM | POA: Diagnosis not present

## 2024-01-28 DIAGNOSIS — J449 Chronic obstructive pulmonary disease, unspecified: Secondary | ICD-10-CM | POA: Diagnosis not present

## 2024-01-28 DIAGNOSIS — Z87891 Personal history of nicotine dependence: Secondary | ICD-10-CM | POA: Diagnosis not present

## 2024-01-28 LAB — CBC WITH DIFFERENTIAL/PLATELET
Abs Immature Granulocytes: 0.04 K/uL (ref 0.00–0.07)
Basophils Absolute: 0 K/uL (ref 0.0–0.1)
Basophils Relative: 0 %
Eosinophils Absolute: 0.5 K/uL (ref 0.0–0.5)
Eosinophils Relative: 4 %
HCT: 32.6 % — ABNORMAL LOW (ref 39.0–52.0)
Hemoglobin: 9.8 g/dL — ABNORMAL LOW (ref 13.0–17.0)
Immature Granulocytes: 0 %
Lymphocytes Relative: 14 %
Lymphs Abs: 1.5 K/uL (ref 0.7–4.0)
MCH: 30.6 pg (ref 26.0–34.0)
MCHC: 30.1 g/dL (ref 30.0–36.0)
MCV: 101.9 fL — ABNORMAL HIGH (ref 80.0–100.0)
Monocytes Absolute: 1.4 K/uL — ABNORMAL HIGH (ref 0.1–1.0)
Monocytes Relative: 13 %
Neutro Abs: 7.1 K/uL (ref 1.7–7.7)
Neutrophils Relative %: 69 %
Platelets: 167 K/uL (ref 150–400)
RBC: 3.2 MIL/uL — ABNORMAL LOW (ref 4.22–5.81)
RDW: 12.5 % (ref 11.5–15.5)
Smear Review: NORMAL
WBC: 10.5 K/uL (ref 4.0–10.5)
nRBC: 0 % (ref 0.0–0.2)

## 2024-01-28 LAB — IRON AND TIBC
Iron: 35 ug/dL — ABNORMAL LOW (ref 45–182)
Saturation Ratios: 14 % — ABNORMAL LOW (ref 17.9–39.5)
TIBC: 249 ug/dL — ABNORMAL LOW (ref 250–450)
UIBC: 214 ug/dL

## 2024-01-28 LAB — COMPREHENSIVE METABOLIC PANEL WITH GFR
ALT: 16 U/L (ref 0–44)
AST: 19 U/L (ref 15–41)
Albumin: 3.9 g/dL (ref 3.5–5.0)
Alkaline Phosphatase: 79 U/L (ref 38–126)
Anion gap: 5 (ref 5–15)
BUN: 18 mg/dL (ref 8–23)
CO2: 39 mmol/L — ABNORMAL HIGH (ref 22–32)
Calcium: 10 mg/dL (ref 8.9–10.3)
Chloride: 98 mmol/L (ref 98–111)
Creatinine, Ser: 0.84 mg/dL (ref 0.61–1.24)
GFR, Estimated: >60 mL/min (ref >60)
Glucose, Bld: 128 mg/dL — ABNORMAL HIGH (ref 70–99)
Potassium: 3.6 mmol/L (ref 3.5–5.1)
Sodium: 142 mmol/L (ref 135–145)
Total Bilirubin: 0.4 mg/dL (ref 0.0–1.2)
Total Protein: 6.8 g/dL (ref 6.5–8.1)

## 2024-01-28 LAB — FERRITIN: Ferritin: 311 ng/mL (ref 24–336)

## 2024-01-28 LAB — PSA: Prostatic Specific Antigen: 0.35 ng/mL (ref 0.00–4.00)

## 2024-01-28 LAB — VITAMIN B12: Vitamin B-12: 1639 pg/mL — ABNORMAL HIGH (ref 180–914)

## 2024-01-28 LAB — FOLATE: Folate: 11.8 ng/mL (ref >5.9)

## 2024-01-30 ENCOUNTER — Telehealth: Payer: Self-pay | Admitting: Pharmacy Technician

## 2024-01-30 ENCOUNTER — Encounter: Payer: Self-pay | Admitting: Oncology

## 2024-01-30 ENCOUNTER — Other Ambulatory Visit (HOSPITAL_COMMUNITY): Payer: Self-pay

## 2024-01-30 DIAGNOSIS — E1122 Type 2 diabetes mellitus with diabetic chronic kidney disease: Secondary | ICD-10-CM | POA: Diagnosis not present

## 2024-01-30 DIAGNOSIS — I10 Essential (primary) hypertension: Secondary | ICD-10-CM | POA: Diagnosis not present

## 2024-01-30 DIAGNOSIS — Z0000 Encounter for general adult medical examination without abnormal findings: Secondary | ICD-10-CM | POA: Diagnosis not present

## 2024-01-30 DIAGNOSIS — J449 Chronic obstructive pulmonary disease, unspecified: Secondary | ICD-10-CM | POA: Diagnosis not present

## 2024-01-30 DIAGNOSIS — E7849 Other hyperlipidemia: Secondary | ICD-10-CM | POA: Diagnosis not present

## 2024-01-30 NOTE — Telephone Encounter (Signed)
 Oral Oncology Patient Advocate Encounter   Was successful in securing patient a $3500 grant from [employer] (PA[employer]) to provide copayment coverage for abiraterone .  This will keep the out of pocket expense at $0.     The billing information is as follows and has been shared with [facility].   RxBin: [id number] PCN:  [id number] Member ID: [id number] Group ID: [id number] Dates of Eligibility: 12/27/2023 through 01/29/2025    Sharada Albornoz (Patty) Chet Burnet, CPhT  [facility], Zelda Salmon, [facility] - Oral Chemotherapy Patient Advocate Specialist III Phone: [phone]  Fax: [phone]

## 2024-02-03 NOTE — Progress Notes (Signed)
 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  Oncology history below. S/p radical resection in 2003 Progression in 2023 with PSA elevation and PSMA PET showing lymphadenopathy. Started on enzalutamide , discontinued due to seizures Currently on abiraterone  and prednisone    - Patient tolerating abiraterone  and prednisone  really well with no side effects.  Very compliant with medications - Labs reviewed today: PSA 0.35.  Improved from prior - Continue abiraterone  1000 mg daily and prednisone  5 mg daily   Return to clinic in 3 months with labs  Normocytic anemia Likely multifactorial: Iron deficiency, secondary to abiraterone  use Several doses of IV iron so far   - Labs reviewed today hemoglobin 9.8, more or less stable.  Iron panel with TSAT: 14, ferritin: 311, Vitamin B12: 1639 -Creatinine is normal today. - Patient already has constipation and could not tolerate oral iron - Discussed giving 2 doses of IV iron.  Patient received several doses prior.  Rediscussed side effects including allergic reactions, nausea, vomiting, headache and skin discoloration.  Patient is in agreement. - Denies melena, blood in stools.  Patient is 89 years old and does not want colonoscopy at this time. Will repeat labs in 3 months   The patient understands the plans discussed today and is in agreement with them.  He knows to contact our office if he develops concerns prior to his next appointment.  The total time spent in the appointment was 15 minutes  seen minutes for the encounter with patient, including review of chart and various tests results, discussions about plan of care and coordination of care plan    Mickiel Dry, MD  [facility] [place] - A DEPT OF [facility] [address] [place] [address] Dept: [phone] Dept Fax: [phone]   Orders Placed This Encounter  Procedures   CBC with Differential    Standing Status:   Future    Expected Date:   04/29/2024    Expiration Date:   07/28/2024   Comprehensive metabolic panel    Standing Status:   Future    Expected Date:   04/29/2024    Expiration Date:   07/28/2024   Iron and TIBC ([facility] ONLY)    Standing Status:   Future    Expected Date:   04/29/2024    Expiration Date:   07/28/2024   Ferritin    Standing Status:   Future    Expected Date:   04/29/2024    Expiration Date:   07/28/2024   Vitamin B12    Standing Status:   Future    Expected Date:   04/29/2024    Expiration Date:   07/28/2024   Folate    Standing Status:   Future    Expected Date:   04/29/2024    Expiration Date:   07/28/2024   PSA    Standing Status:   Future    Expected Date:   04/29/2024    Expiration Date:   07/28/2024     ONCOLOGY  HISTORY:   I have reviewed his chart and materials related to his cancer extensively and collaborated history with the patient. 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 -04/10/2001: Retropubic Radical prostatectomy. Pathology: Prostate: Infiltrating adenocarcinoma of left lobe, Right left iliac and obturator lymph nodes: No tumor identified - Had biochemical failure and was on leupron for 5-6 years. He came off of Lupron  and the PSA rose again so he is now on chronic Lupron  therapy q3 months ( From Dr.Mckenzie's note on 03/26/2019) -08/13/2015: PSA <  0.1 -03/26/2019: PSA: 0.4 -03/26/2019-12/06/2022: Leuprolide  22.5mg  every 3 months -10/29/2020: NM Bone scan: No scintigraphic evidence of bony metastatic disease.  -10/29/2020: CT A&P w contrast: No acute findings. No evidence of recurrent or metastatic carcinoma within the abdomen or pelvis. -10/17/2021: PSA:2.1 -12/01/2021: PSMA PET: No evidence of prostate cancer recurrence in the prostatectomy bed. Multiple small intensely radiotracer avid prostate cancer metastasis to pelvic lymph nodes involving the RIGHT common iliac nodes, RIGHT external iliac nodes and RIGHT internal iliac nodes. Most superior metastatic node is at the L4 vertebral body level RIGHT of the IVC. No visceral metastasis or skeletal metastasis. -12/07/2021-07/17/2022: Enzalutamide  160mg  daily. Discontinued for seizures -12/18/2022: PSA: 2.23 -12/18/2022-Current: Abiraterone  1000mg  daily + prednisone  5mg  daily -12/26/2022: Invitae germline mutation test: Negative -12/28/2022: PET:  Near identical pattern of intense radiotracer avid metastatic RIGHT iliac lymph nodes. No evidence of progression or improvement from comparison exam. No evidence of local recurrence in the  prostatectomy bed. No evidence of visceral metastasis or skeletal metastasis. -03/16/2023-Current: Leuprolide  22.5mg  every 3 months  Current Treatment:  Abiraterone  1000mg  daily + prednisone  5mg  daily  INTERVAL HISTORY:   Discussed the use of AI scribe software for clinical note transcription with the patient, who gave verbal consent to proceed.  History of Present Illness Danny Martinez is an 89 year old male with prostate cancer and COPD who presents for follow up.He is accompanied by his son today.   He experiences issues with oxygen  levels, particularly during physical activities, leading to exhaustion despite not increasing his oxygen  needs. He uses supplemental oxygen  for COPD and has quit smoking.  He is on Zytiga  (abiraterone ) and prednisone  for  prostate cancer management, with no significant side effects such as tiredness.   He has a history of anemia and iron deficiency, with a current hemoglobin level of 9.8. He has received several IV iron infusions so far. He experiences constipation from oral iron supplements and does not take them. He also reports skin discoloration from previous iron infusions.  He has stopped taking vitamin B12 supplements after a report indicated abnormalities. He does not have dark colored stools.    I have reviewed the past medical history, past surgical history, social history and family history with the patient and they are unchanged from previous note.  ALLERGIES:  is allergic to metformin and related and neomycin-bacitracin zn-polymyx.  MEDICATIONS:  Current Outpatient Medications  Medication Sig Dispense Refill   abiraterone  acetate (ZYTIGA ) 250 MG tablet Take 4 tablets (1,000 mg total) by mouth daily. Take on an empty stomach 1 hour before or 2 hours after a meal 120 tablet 2   albuterol  (PROVENTIL ) (2.5 MG/3ML) 0.083% nebulizer solution Take 2.5 mg by nebulization every 4 (four) hours as needed.     apixaban (ELIQUIS) 5 MG TABS tablet Take by mouth.     cyanocobalamin  (VITAMIN B12) 1000 MCG tablet  Take 1,000 mcg by mouth daily.     famotidine (PEPCID) 20 MG tablet Take 20 mg by mouth 2 (two) times daily.     Fluticasone-Salmeterol (ADVAIR) 250-50 MCG/DOSE AEPB Inhale 1 puff into the lungs every 12 (twelve) hours.     LINZESS 72 MCG capsule Take 72 mcg by mouth daily.     metoprolol tartrate (LOPRESSOR) 25 MG tablet Take by mouth.     OXYGEN  Inhale 3.5 L into the lungs.     Oyster Shell Calcium 500 MG TABS Take 1 tablet by mouth 2 (two) times daily.     predniSONE  (DELTASONE ) 5 MG tablet TAKE 1 TABLET BY MOUTH DAILY WITH BREAKFAST 30 tablet 3   sodium chloride , hypertonic, 3 % solution SMARTSIG:3 Milliliter(s) Via Nebulizer Twice Daily PRN     telmisartan-hydrochlorothiazide (MICARDIS HCT) 80-25  MG tablet Take 1 tablet by mouth daily.     tiotropium (SPIRIVA) 18 MCG inhalation capsule Place 18 mcg into inhaler and inhale daily.     Current Facility-Administered Medications  Medication Dose Route Frequency Provider Last Rate Last Admin   leuprolide  (LUPRON ) injection 22.5 mg  22.5 mg Intramuscular Q90 days Sherrilee Belvie CROME, MD   22.5 mg at 09/12/23 1117   Leuprolide  Acetate (3 Month) (ELIGARD ) 22.5 MG injection 22.5 mg  22.5 mg Subcutaneous Q90 days Sherrilee Belvie CROME, MD   22.5 mg at 12/21/23 1215    REVIEW OF SYSTEMS:   Constitutional: Denies fevers, chills or abnormal weight loss Eyes: Denies blurriness of vision Ears, nose, mouth, throat, and face: Denies mucositis or sore throat Respiratory: Denies cough, dyspnea or wheezes Cardiovascular: Denies palpitation, chest discomfort or lower extremity swelling Gastrointestinal:  Denies nausea, heartburn or change in bowel habits Skin: Denies abnormal skin rashes Lymphatics: Denies new lymphadenopathy or easy bruising Neurological:Denies numbness, tingling or new weaknesses Behavioral/Psych: Mood is stable, no new changes  All other systems were reviewed with the patient and are negative.   VITALS:  Blood pressure (!) 140/65, pulse 77, temperature 97.9 F (36.6 C), temperature source Oral, resp. rate 16, weight 216 lb 1.6 oz (98 kg), SpO2 99%.  Wt Readings from Last 3 Encounters:  02/04/24 216 lb 1.6 oz (98 kg)  10/31/23 214 lb 14.4 oz (97.5 kg)  07/31/23 218 lb 3.2 oz (99 kg)    Body mass index is 28.12 kg/m.  Performance status (ECOG): 2 - Symptomatic, <50% confined to bed  PHYSICAL EXAM:   GENERAL:alert, no distress and comfortable, in a wheel chair SKIN: skin color, texture, turgor are normal, no rashes or significant lesions LYMPH:  no palpable lymphadenopathy in the cervical, axillary or inguinal LUNGS: clear to auscultation with normal breathing effort HEART: regular rate & rhythm and no murmurs and no lower  extremity edema ABDOMEN:abdomen soft, non-tender and normal bowel sounds Musculoskeletal:no cyanosis of digits and no clubbing  NEURO: alert & oriented x 3 with fluent speech   LABORATORY DATA:  I have reviewed the data as listed    Component Value Date/Time   NA 142 01/28/2024 1503   NA 143 04/18/2022 0931   K 3.6 01/28/2024 1503   CL 98 01/28/2024 1503   CO2 39 (H) 01/28/2024 1503   GLUCOSE 128 (H) 01/28/2024 1503   BUN 18 01/28/2024 1503   BUN 20 04/18/2022 0931   CREATININE 0.84 01/28/2024 1503   CALCIUM 10.0 01/28/2024 1503   PROT 6.8 01/28/2024 1503   PROT 6.5 04/18/2022 0931   ALBUMIN 3.9 01/28/2024 1503   ALBUMIN 4.2  04/18/2022 0931   AST 19 01/28/2024 1503   ALT 16 01/28/2024 1503   ALKPHOS 79 01/28/2024 1503   BILITOT 0.4 01/28/2024 1503   BILITOT <0.2 04/18/2022 0931   GFRNONAA >60 01/28/2024 1503   GFRAA (L) 10/20/2006 0535    53        The eGFR has been calculated using the MDRD equation. This calculation has not been validated in all clinical    Lab Results  Component Value Date   WBC 10.5 01/28/2024   NEUTROABS 7.1 01/28/2024   HGB 9.8 (L) 01/28/2024   HCT 32.6 (L) 01/28/2024   MCV 101.9 (H) 01/28/2024   PLT 167 01/28/2024      Chemistry      Component Value Date/Time   NA 142 01/28/2024 1503   NA 143 04/18/2022 0931   K 3.6 01/28/2024 1503   CL 98 01/28/2024 1503   CO2 39 (H) 01/28/2024 1503   BUN 18 01/28/2024 1503   BUN 20 04/18/2022 0931   CREATININE 0.84 01/28/2024 1503      Component Value Date/Time   CALCIUM 10.0 01/28/2024 1503   ALKPHOS 79 01/28/2024 1503   AST 19 01/28/2024 1503   ALT 16 01/28/2024 1503   BILITOT 0.4 01/28/2024 1503   BILITOT <0.2 04/18/2022 0931      Latest Reference Range & Units 01/28/24 15:03  Prostatic Specific Antigen 0.00 - 4.00 ng/mL 0.35    Latest Reference Range & Units 01/28/24 15:03  Iron 45 - 182 ug/dL 35 (L)  UIBC ug/dL 785  TIBC 749 - 549 ug/dL 750 (L)  Saturation Ratios 17.9 - 39.5 %  14 (L)  Ferritin 24 - 336 ng/mL 311  Folate >5.9 ng/mL 11.8  Vitamin B12 180 - 914 pg/mL 1,639 (H)  (L): Data is abnormally low (H): Data is abnormally high  RADIOGRAPHIC STUDIES: I have personally reviewed the radiological images as listed and agreed with the findings in the report.

## 2024-02-04 ENCOUNTER — Inpatient Hospital Stay: Admitting: Oncology

## 2024-02-04 VITALS — BP 140/65 | HR 77 | Temp 97.9°F | Resp 16 | Wt 216.1 lb

## 2024-02-04 DIAGNOSIS — J449 Chronic obstructive pulmonary disease, unspecified: Secondary | ICD-10-CM | POA: Diagnosis not present

## 2024-02-04 DIAGNOSIS — Z7952 Long term (current) use of systemic steroids: Secondary | ICD-10-CM | POA: Diagnosis not present

## 2024-02-04 DIAGNOSIS — C61 Malignant neoplasm of prostate: Secondary | ICD-10-CM | POA: Diagnosis not present

## 2024-02-04 DIAGNOSIS — C775 Secondary and unspecified malignant neoplasm of intrapelvic lymph nodes: Secondary | ICD-10-CM | POA: Diagnosis not present

## 2024-02-04 DIAGNOSIS — Z79899 Other long term (current) drug therapy: Secondary | ICD-10-CM | POA: Diagnosis not present

## 2024-02-04 DIAGNOSIS — D649 Anemia, unspecified: Secondary | ICD-10-CM | POA: Diagnosis not present

## 2024-02-04 DIAGNOSIS — Z87891 Personal history of nicotine dependence: Secondary | ICD-10-CM | POA: Diagnosis not present

## 2024-02-04 DIAGNOSIS — K5900 Constipation, unspecified: Secondary | ICD-10-CM | POA: Diagnosis not present

## 2024-02-04 NOTE — Patient Instructions (Signed)

## 2024-02-04 NOTE — Progress Notes (Signed)
 Patient is taking Zytiga as prescribed. He has not missed any doses and reports no side effects at this time.

## 2024-02-05 DIAGNOSIS — J9611 Chronic respiratory failure with hypoxia: Secondary | ICD-10-CM | POA: Diagnosis not present

## 2024-02-05 DIAGNOSIS — I10 Essential (primary) hypertension: Secondary | ICD-10-CM | POA: Diagnosis not present

## 2024-02-05 DIAGNOSIS — E785 Hyperlipidemia, unspecified: Secondary | ICD-10-CM | POA: Diagnosis not present

## 2024-02-05 DIAGNOSIS — I5032 Chronic diastolic (congestive) heart failure: Secondary | ICD-10-CM | POA: Diagnosis not present

## 2024-02-05 NOTE — Progress Notes (Signed)
 Office Follow up Note  02/06/2024  Interval History:  Danny Martinez is  89 y.o. yrs old male who returns in follow-up today.  He has history of hypertension, type 2 diabetes, chronic hypoxic respiratory failure secondary to COPD, mild pulmonary hypertension, history of paroxysmal atrial fibrillation and chronic diastolic congestive heart failure.   Overall, he seems to be doing fairly well.  He is not much active at baseline secondary to need for 24/7 oxygen .  He hardly walks for 5 to 10 minutes at home.  He does have chronic baseline dyspnea with exertion even with mild level.  No chest pain or discomfort.  No prior coronary events.   He is on anticoagulation with Eliquis 5 mg twice daily, no bleeding issues.  He is on Toprol-XL 25 mg twice daily for history of atrial fibrillation.  He does have issue with chronic anemia secondary to iron deficiency and anemia of chronic disease. No acute bleeding issues. He does have chronic trace lower extremity edema at baseline, stable over the last 1 to 2 years.   Echocardiogram 07/18/2022  .  Left Ventricle: EF: 60-65%. Ejection fraction measured by 3D is 56%, .  Left Ventricle: Doppler parameters consistent with mild diastolic dysfunction and low to normal LA pressure. .  Aortic Valve: There is mild sclerosis. .  Tricuspid Valve: There is moderate regurgitation. .  Tricuspid Valve: The right ventricular systolic pressure is mildly elevated (37-49 mmHg).   EKG today was with baseline artifact, likely sinus with PAC, right bundle branch block.  Current Medications[1]  REVIEW OF SYSTEMS: The patient denies nausea, vomiting, diarrhea, fever, chills, or bleeding. All other systems are reviewed and are negative except for that mentioned in the history of present illness.  LABS:   Lab Results  Component Value Date   CHOLESTEROL TOTAL 141 07/18/2022   Lab Results  Component Value  Date   HDL 48 07/18/2022   Lab Results  Component Value Date   LDL 86 07/18/2022   Lab Results  Component Value Date   Trig 36 07/18/2022   Lab Results  Component Value Date   CHOL/HDL 3 07/18/2022    Lab Results  Component Value Date   WBC 7.3 07/19/2022   HGB 9.8 (L) 07/19/2022   HCT 32.4 (L) 07/19/2022   MCV 94.7 (H) 07/19/2022   Plt Ct 164 07/19/2022    Lab Results  Component Value Date   Creatinine 1.05 07/19/2022   BUN 16 07/19/2022   Na 137 07/19/2022   Potassium 4.0 07/19/2022   Cl 98 07/19/2022   CO2 34 (H) 07/19/2022   Lab Results  Component Value Date   TSH 0.68 07/19/2022     PHYSICAL EXAM:  Vital Signs:   BP 142/82 (BP Location: Left Upper Arm, Patient Position: Sitting)   Pulse 64   Ht 6' 1 (1.854 m)   Wt 216 lb (98 kg)   SpO2 94% Comment: With 02 machine in use.  BMI 28.50 kg/m  Constitutional: Well-nourished well-developed ,  no acute distress.  HENT:normocephalic,atraumatic,oropharynx moist,no oral exudates.   Neck: Supple. No JVD. Carotid bruits: Absent bilaterally. Cardiovascular: irregular. No murmur, No gallop. No rub. Respiratory: Mildly decreased breath sounds bilaterally.  No wheezes or crackles noted GI: Soft, nontender, nondistended, with normal bowel sounds, no masses or hepatosplenomegaly.  Skin: Warm, dry, no erythema, no rash. Musculoskeletal: No edema, no tenderness, no cyanosis, no clubbing. Pulses: 1+ and intact bilaterally  Impression and plan:    1. Chronic diastolic congestive heart  failure (*)      2. Hyperlipidemia, unspecified hyperlipidemia type  ECG 12 lead    3. Hypertension, unspecified type  ECG 12 lead    4. Chronic respiratory failure with hypoxia (*)      5. Type 2 diabetes mellitus with hyperglycemia, without long-term current use of insulin (*)      6. Paroxysmal atrial fibrillation (*)      7. Anticoagulated      8. Anemia, chronic disease             EKG reveals sinus with frequent PACs,  he is on Eliquis 5 mg twice daily.  No bleeding issues.  Does have chronic anemia with baseline hemoglobin of 9 g/dL at baseline.  Was not interested in Watchman device when discussed in the past.  From CHF standpoint, he is euvolemic.    Blood pressure is at goal.  LDL level is normal. He does have mild pulm hypertension secondary to hypoxic respiratory failure, COPD and diastolic dysfunction.  Will consider follow-up echocardiogram next year, sooner if dyspnea gets worse. We had discussion about importance of life style modification, keeping BP, blood sugar and cholesterol, body weight at goal, role of regular exercise (at least 30 minutes of moderate intensity aerobic exercise 5 days in a week), following low fat, low carb, low salt heart healthy diet, abstinence from smoking and drinking etc.  We also discussed about visiting PCP visit for non cardiac issues, importance of regular follow up, health screening  and medication compliance. Follow-up in 6 to 9 months.  Marcello MARLA Lennox, MD   Note: This documented was generated using voice recognition software. There may be unintended transcription errors that were not detected upon document review.               [1]  Current Outpatient Medications:  .  abiraterone  acetate (ZYTIGA ) 250 mg TABS, Take four tablets (1,000 mg dose) by mouth daily., Disp: , Rfl:  .  albuterol  sulfate (PROVENTIL ) 2.5 mg/3 mL nebulizer solution, Take 3 mLs (2.5 mg dose) by nebulization every 4 (four) hours as needed., Disp: , Rfl:  .  apixaban (ELIQUIS) 5 mg tablet, Take one tablet (5 mg dose) by mouth 2 (two) times daily., Disp: 60 tablet, Rfl: 0 .  CALCIUM CARBONATE 500 MG tablet, Take one tablet (1,250 mg dose) by mouth 2 (two) times daily., Disp: , Rfl:  .  famotidine (PEPCID) 20 MG tablet, Take one tablet (20 mg dose) by mouth 2 (two) times daily., Disp: , Rfl:  .  fluticasone-salmeterol (ADVAIR HFA) 230-21 MCG/ACT inhaler, Inhale two puffs into the lungs 2  (two) times daily., Disp: , Rfl:  .  glimepiride (AMARYL) 1 mg tablet, Take one tablet (1 mg dose) by mouth 1 (one) time each day before breakfast. (Patient not taking: Reported on 02/05/2024), Disp: , Rfl:  .  leuprolide , 3 Month, (ELIGARD ) 22.5 MG injection, Inject twenty two and a half mg into the skin every 3 (three) months., Disp: , Rfl:  .  LINZESS 72 MCG capsule, Take one capsule (72 mcg dose) by mouth daily., Disp: , Rfl:  .  metoprolol tartrate (LOPRESSOR) 25 mg tablet, Take one tablet (25 mg dose) by mouth 2 (two) times daily., Disp: 60 tablet, Rfl: 0 .  predniSONE  (DELTASONE ) 5 mg tablet, Take one tablet (5 mg dose) by mouth 1 (one) time each day with breakfast., Disp: , Rfl:  .  sodium chloride  3% infusion, SMARTSIG:3 Milliliter(s) Via Nebulizer Twice Daily PRN,  Disp: , Rfl:  .  telmisartan-hydrochlorothiazide (MICARDIS HCT) 80-25 MG per tablet, Take one tablet by mouth daily., Disp: , Rfl:  .  tiotropium bromide monohydrate (SPIRIVA) 18 mcg inhalation capsule, Place one capsule (18 mcg dose) into inhaler and inhale daily., Disp: , Rfl:

## 2024-02-06 DIAGNOSIS — E1122 Type 2 diabetes mellitus with diabetic chronic kidney disease: Secondary | ICD-10-CM | POA: Diagnosis not present

## 2024-02-06 DIAGNOSIS — N182 Chronic kidney disease, stage 2 (mild): Secondary | ICD-10-CM | POA: Diagnosis not present

## 2024-02-14 ENCOUNTER — Inpatient Hospital Stay: Attending: Hematology

## 2024-02-14 VITALS — BP 141/65 | HR 52 | Temp 97.0°F | Resp 18

## 2024-02-14 DIAGNOSIS — C775 Secondary and unspecified malignant neoplasm of intrapelvic lymph nodes: Secondary | ICD-10-CM | POA: Insufficient documentation

## 2024-02-14 DIAGNOSIS — D649 Anemia, unspecified: Secondary | ICD-10-CM | POA: Diagnosis present

## 2024-02-14 DIAGNOSIS — D509 Iron deficiency anemia, unspecified: Secondary | ICD-10-CM

## 2024-02-14 DIAGNOSIS — C61 Malignant neoplasm of prostate: Secondary | ICD-10-CM | POA: Insufficient documentation

## 2024-02-14 MED ORDER — SODIUM CHLORIDE 0.9 % IV SOLN
510.0000 mg | Freq: Once | INTRAVENOUS | Status: AC
  Administered 2024-02-14: 510 mg via INTRAVENOUS
  Filled 2024-02-14: qty 510

## 2024-02-14 MED ORDER — SODIUM CHLORIDE 0.9 % IV SOLN: INTRAVENOUS | Status: DC

## 2024-02-14 NOTE — Patient Instructions (Signed)

## 2024-02-14 NOTE — Progress Notes (Signed)
 Patient tolerated iron infusion with no complaints voiced.  Peripheral IV site clean and dry with good blood return noted before and after infusion.  Band aid applied.  VSS with discharge and left in satisfactory condition with no s/s of distress noted.

## 2024-02-15 ENCOUNTER — Other Ambulatory Visit (HOSPITAL_COMMUNITY): Payer: Self-pay

## 2024-02-21 ENCOUNTER — Inpatient Hospital Stay

## 2024-02-21 ENCOUNTER — Other Ambulatory Visit (HOSPITAL_COMMUNITY): Payer: Self-pay

## 2024-02-21 VITALS — BP 127/53 | HR 77 | Temp 97.1°F | Resp 18

## 2024-02-21 DIAGNOSIS — D649 Anemia, unspecified: Secondary | ICD-10-CM | POA: Diagnosis not present

## 2024-02-21 DIAGNOSIS — D509 Iron deficiency anemia, unspecified: Secondary | ICD-10-CM

## 2024-02-21 MED ORDER — SODIUM CHLORIDE 0.9 % IV SOLN: Freq: Once | INTRAVENOUS | Status: AC

## 2024-02-21 MED ORDER — SODIUM CHLORIDE 0.9 % IV SOLN
510.0000 mg | Freq: Once | INTRAVENOUS | Status: AC
  Administered 2024-02-21: 510 mg via INTRAVENOUS
  Filled 2024-02-21: qty 510

## 2024-02-21 NOTE — Patient Instructions (Signed)

## 2024-02-21 NOTE — Progress Notes (Signed)
 Patient tolerated iron infusion with no complaints voiced.  Peripheral IV site clean and dry with good blood return noted before and after infusion.  Band aid applied.  VSS with discharge and left in satisfactory condition with no s/s of distress noted.

## 2024-02-25 ENCOUNTER — Other Ambulatory Visit: Payer: Self-pay

## 2024-02-25 NOTE — Progress Notes (Signed)
 Specialty Pharmacy Refill Coordination Note  Danny Martinez is a 89 y.o. male contacted today regarding refills of specialty medication(s) Abiraterone  Acetate (ZYTIGA )   Patient requested (Patient-Rptd) Delivery   Delivery date: 02/27/24   Verified address: (Patient-Rptd) [address]  [place] [address]   Medication will be filled on: 02/26/24

## 2024-02-26 ENCOUNTER — Other Ambulatory Visit: Payer: Self-pay

## 2024-02-26 DIAGNOSIS — J449 Chronic obstructive pulmonary disease, unspecified: Secondary | ICD-10-CM | POA: Diagnosis not present

## 2024-02-26 DIAGNOSIS — R0902 Hypoxemia: Secondary | ICD-10-CM | POA: Diagnosis not present

## 2024-03-10 ENCOUNTER — Encounter: Payer: Self-pay | Admitting: *Deleted

## 2024-03-20 ENCOUNTER — Other Ambulatory Visit: Payer: Self-pay

## 2024-03-24 ENCOUNTER — Other Ambulatory Visit: Payer: Self-pay

## 2024-03-24 ENCOUNTER — Other Ambulatory Visit (HOSPITAL_COMMUNITY): Payer: Self-pay

## 2024-03-24 NOTE — Progress Notes (Signed)
 Specialty Pharmacy Ongoing Clinical Assessment Note  Danny Martinez is a 89 y.o. male who is being followed by the specialty pharmacy service for RxSp Oncology   Patient's specialty medication(s) reviewed today: Abiraterone  Acetate (ZYTIGA )   Missed doses in the last 4 weeks: 0   Patient/Caregiver did not have any additional questions or concerns.   Therapeutic benefit summary: Patient is achieving benefit   Adverse events/side effects summary: No adverse events/side effects   Patient's therapy is appropriate to: Continue    Goals Addressed             This Visit's Progress    Maintain optimal adherence to therapy   On track    Patient is on track. Patient will maintain adherence         Follow up: 6 months  [facility] Specialty Pharmacist

## 2024-03-24 NOTE — Progress Notes (Signed)
 Specialty Pharmacy Refill Coordination Note  Danny Martinez is a 89 y.o. male contacted today regarding refills of specialty medication(s) Abiraterone  Acetate (ZYTIGA )   Patient requested Delivery   Delivery date: 03/26/24   Verified address: [address]  [place] [address]   Medication will be filled on: 03/25/24

## 2024-03-25 ENCOUNTER — Other Ambulatory Visit: Payer: Self-pay

## 2024-04-17 ENCOUNTER — Other Ambulatory Visit: Payer: Self-pay | Admitting: Oncology

## 2024-04-17 ENCOUNTER — Other Ambulatory Visit: Payer: Self-pay

## 2024-04-17 MED ORDER — ABIRATERONE ACETATE 250 MG PO TABS
1000.0000 mg | ORAL_TABLET | Freq: Every day | ORAL | 2 refills | Status: AC
  Filled 2024-04-17 – 2024-04-18 (×2): qty 120, 30d supply, fill #0

## 2024-04-18 ENCOUNTER — Other Ambulatory Visit (HOSPITAL_COMMUNITY): Payer: Self-pay

## 2024-04-23 ENCOUNTER — Other Ambulatory Visit

## 2024-04-30 ENCOUNTER — Ambulatory Visit: Admitting: Urology

## 2024-05-05 ENCOUNTER — Inpatient Hospital Stay: Attending: Hematology

## 2024-05-12 ENCOUNTER — Inpatient Hospital Stay: Attending: Hematology | Admitting: Oncology

## 2024-06-23 ENCOUNTER — Other Ambulatory Visit
